# Patient Record
Sex: Female | Born: 1950 | Race: White | Hispanic: No | Marital: Married | Smoking: Never smoker
Health system: Southern US, Community
[De-identification: ages and names within clinical notes are randomized; demographics above are authoritative.]

## PROBLEM LIST (undated history)

## (undated) DIAGNOSIS — I8390 Asymptomatic varicose veins of unspecified lower extremity: Secondary | ICD-10-CM

## (undated) DIAGNOSIS — F1011 Alcohol abuse, in remission: Secondary | ICD-10-CM

## (undated) DIAGNOSIS — M419 Scoliosis, unspecified: Secondary | ICD-10-CM

## (undated) DIAGNOSIS — G4730 Sleep apnea, unspecified: Secondary | ICD-10-CM

## (undated) DIAGNOSIS — F1910 Other psychoactive substance abuse, uncomplicated: Secondary | ICD-10-CM

## (undated) DIAGNOSIS — H269 Unspecified cataract: Secondary | ICD-10-CM

## (undated) DIAGNOSIS — I471 Supraventricular tachycardia: Secondary | ICD-10-CM

## (undated) DIAGNOSIS — M1990 Unspecified osteoarthritis, unspecified site: Secondary | ICD-10-CM

## (undated) DIAGNOSIS — I10 Essential (primary) hypertension: Secondary | ICD-10-CM

## (undated) DIAGNOSIS — T7840XA Allergy, unspecified, initial encounter: Secondary | ICD-10-CM

## (undated) DIAGNOSIS — M8580 Other specified disorders of bone density and structure, unspecified site: Secondary | ICD-10-CM

## (undated) DIAGNOSIS — F32A Depression, unspecified: Secondary | ICD-10-CM

## (undated) DIAGNOSIS — C4490 Unspecified malignant neoplasm of skin, unspecified: Secondary | ICD-10-CM

## (undated) DIAGNOSIS — I499 Cardiac arrhythmia, unspecified: Secondary | ICD-10-CM

## (undated) DIAGNOSIS — K219 Gastro-esophageal reflux disease without esophagitis: Secondary | ICD-10-CM

## (undated) DIAGNOSIS — E785 Hyperlipidemia, unspecified: Secondary | ICD-10-CM

## (undated) DIAGNOSIS — Z973 Presence of spectacles and contact lenses: Secondary | ICD-10-CM

## (undated) DIAGNOSIS — F329 Major depressive disorder, single episode, unspecified: Secondary | ICD-10-CM

## (undated) DIAGNOSIS — E119 Type 2 diabetes mellitus without complications: Secondary | ICD-10-CM

## (undated) HISTORY — DX: Alcohol abuse, in remission: F10.11

## (undated) HISTORY — DX: Type 2 diabetes mellitus without complications: E11.9

## (undated) HISTORY — DX: Scoliosis, unspecified: M41.9

## (undated) HISTORY — DX: Sleep apnea, unspecified: G47.30

## (undated) HISTORY — DX: Depression, unspecified: F32.A

## (undated) HISTORY — DX: Unspecified cataract: H26.9

## (undated) HISTORY — PX: OTHER SURGICAL HISTORY: SHX169

## (undated) HISTORY — PX: ANTERIOR FUSION CERVICAL SPINE: SUR626

## (undated) HISTORY — DX: Allergy, unspecified, initial encounter: T78.40XA

## (undated) HISTORY — PX: BREAST BIOPSY: SHX20

## (undated) HISTORY — DX: Other specified disorders of bone density and structure, unspecified site: M85.80

## (undated) HISTORY — DX: Unspecified osteoarthritis, unspecified site: M19.90

## (undated) HISTORY — DX: Unspecified malignant neoplasm of skin, unspecified: C44.90

## (undated) HISTORY — DX: Hyperlipidemia, unspecified: E78.5

## (undated) HISTORY — PX: COLONOSCOPY W/ POLYPECTOMY: SHX1380

## (undated) HISTORY — DX: Asymptomatic varicose veins of unspecified lower extremity: I83.90

## (undated) HISTORY — DX: Major depressive disorder, single episode, unspecified: F32.9

## (undated) HISTORY — DX: Supraventricular tachycardia: I47.1

## (undated) HISTORY — DX: Other psychoactive substance abuse, uncomplicated: F19.10

---
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
## 2004-05-08 ENCOUNTER — Other Ambulatory Visit: Admission: RE | Admit: 2004-05-08 | Discharge: 2004-05-08 | Payer: Self-pay | Admitting: Obstetrics & Gynecology

## 2004-11-05 ENCOUNTER — Ambulatory Visit: Payer: Self-pay | Admitting: Internal Medicine

## 2005-01-28 ENCOUNTER — Ambulatory Visit: Payer: Self-pay | Admitting: Internal Medicine

## 2005-06-20 ENCOUNTER — Other Ambulatory Visit: Admission: RE | Admit: 2005-06-20 | Discharge: 2005-06-20 | Payer: Self-pay | Admitting: Obstetrics & Gynecology

## 2005-12-30 ENCOUNTER — Ambulatory Visit: Payer: Self-pay | Admitting: Internal Medicine

## 2006-08-27 ENCOUNTER — Ambulatory Visit: Payer: Self-pay | Admitting: Internal Medicine

## 2006-11-02 ENCOUNTER — Ambulatory Visit: Payer: Self-pay | Admitting: Internal Medicine

## 2007-02-01 ENCOUNTER — Ambulatory Visit: Payer: Self-pay | Admitting: Internal Medicine

## 2007-02-01 LAB — CONVERTED CEMR LAB
BUN: 7 mg/dL (ref 6–23)
CO2: 30 meq/L (ref 19–32)
Calcium: 9.5 mg/dL (ref 8.4–10.5)
Chloride: 107 meq/L (ref 96–112)
Cholesterol: 257 mg/dL (ref 0–200)
Direct LDL: 170.7 mg/dL
GFR calc non Af Amer: 92 mL/min
Potassium: 5 meq/L (ref 3.5–5.1)
VLDL: 35 mg/dL (ref 0–40)
Vit D, 1,25-Dihydroxy: 41 (ref 20–57)

## 2007-02-08 ENCOUNTER — Ambulatory Visit: Payer: Self-pay | Admitting: Internal Medicine

## 2007-06-15 DIAGNOSIS — M1712 Unilateral primary osteoarthritis, left knee: Secondary | ICD-10-CM | POA: Insufficient documentation

## 2007-06-15 DIAGNOSIS — E785 Hyperlipidemia, unspecified: Secondary | ICD-10-CM | POA: Insufficient documentation

## 2007-06-15 DIAGNOSIS — M949 Disorder of cartilage, unspecified: Secondary | ICD-10-CM

## 2007-06-15 DIAGNOSIS — F329 Major depressive disorder, single episode, unspecified: Secondary | ICD-10-CM | POA: Insufficient documentation

## 2007-06-15 DIAGNOSIS — R7309 Other abnormal glucose: Secondary | ICD-10-CM | POA: Insufficient documentation

## 2007-06-15 DIAGNOSIS — M899 Disorder of bone, unspecified: Secondary | ICD-10-CM | POA: Insufficient documentation

## 2007-06-15 DIAGNOSIS — F1011 Alcohol abuse, in remission: Secondary | ICD-10-CM | POA: Insufficient documentation

## 2007-06-15 DIAGNOSIS — J309 Allergic rhinitis, unspecified: Secondary | ICD-10-CM | POA: Insufficient documentation

## 2007-08-02 ENCOUNTER — Ambulatory Visit: Payer: Self-pay | Admitting: Internal Medicine

## 2007-08-06 ENCOUNTER — Telehealth: Payer: Self-pay | Admitting: Internal Medicine

## 2007-08-06 LAB — CONVERTED CEMR LAB
Total CHOL/HDL Ratio: 7
Triglycerides: 181 mg/dL — ABNORMAL HIGH (ref 0–149)

## 2007-08-09 ENCOUNTER — Ambulatory Visit: Payer: Self-pay | Admitting: Internal Medicine

## 2007-10-20 ENCOUNTER — Ambulatory Visit: Payer: Self-pay | Admitting: Internal Medicine

## 2007-10-20 LAB — CONVERTED CEMR LAB
AST: 24 units/L (ref 0–37)
Albumin: 3.9 g/dL (ref 3.5–5.2)
Alkaline Phosphatase: 67 units/L (ref 39–117)
Bilirubin, Direct: 0.1 mg/dL (ref 0.0–0.3)
Cholesterol: 242 mg/dL (ref 0–200)
TSH: 1.57 microintl units/mL (ref 0.35–5.50)
Total Bilirubin: 1.1 mg/dL (ref 0.3–1.2)
Total CHOL/HDL Ratio: 5.4
Total Protein: 6.8 g/dL (ref 6.0–8.3)

## 2007-10-27 ENCOUNTER — Ambulatory Visit: Payer: Self-pay | Admitting: Internal Medicine

## 2007-10-27 DIAGNOSIS — Z86010 Personal history of colonic polyps: Secondary | ICD-10-CM | POA: Insufficient documentation

## 2007-10-27 LAB — CONVERTED CEMR LAB: HDL goal, serum: 40 mg/dL

## 2007-12-21 ENCOUNTER — Ambulatory Visit: Payer: Self-pay | Admitting: Internal Medicine

## 2007-12-23 LAB — CONVERTED CEMR LAB
HDL: 41.9 mg/dL (ref >39.0)
Total CHOL/HDL Ratio: 4.2
Triglycerides: 143 mg/dL (ref 0–149)
VLDL: 29 mg/dL (ref 0–40)

## 2007-12-29 ENCOUNTER — Ambulatory Visit: Payer: Self-pay | Admitting: Internal Medicine

## 2007-12-29 DIAGNOSIS — M4120 Other idiopathic scoliosis, site unspecified: Secondary | ICD-10-CM | POA: Insufficient documentation

## 2007-12-29 DIAGNOSIS — K644 Residual hemorrhoidal skin tags: Secondary | ICD-10-CM | POA: Insufficient documentation

## 2007-12-29 DIAGNOSIS — M2170 Unequal limb length (acquired), unspecified site: Secondary | ICD-10-CM | POA: Insufficient documentation

## 2007-12-29 DIAGNOSIS — I10 Essential (primary) hypertension: Secondary | ICD-10-CM | POA: Insufficient documentation

## 2007-12-29 DIAGNOSIS — M25569 Pain in unspecified knee: Secondary | ICD-10-CM | POA: Insufficient documentation

## 2007-12-29 LAB — CONVERTED CEMR LAB: LDL Goal: 130 mg/dL

## 2008-01-03 ENCOUNTER — Encounter: Payer: Self-pay | Admitting: Internal Medicine

## 2008-01-06 ENCOUNTER — Ambulatory Visit: Payer: Self-pay | Admitting: Pulmonary Disease

## 2008-01-06 DIAGNOSIS — G4721 Circadian rhythm sleep disorder, delayed sleep phase type: Secondary | ICD-10-CM | POA: Insufficient documentation

## 2008-01-14 ENCOUNTER — Ambulatory Visit: Payer: Self-pay | Admitting: Internal Medicine

## 2008-03-07 ENCOUNTER — Encounter: Payer: Self-pay | Admitting: Pulmonary Disease

## 2008-03-21 ENCOUNTER — Ambulatory Visit: Payer: Self-pay | Admitting: Pulmonary Disease

## 2008-03-21 DIAGNOSIS — G4733 Obstructive sleep apnea (adult) (pediatric): Secondary | ICD-10-CM | POA: Insufficient documentation

## 2008-04-03 ENCOUNTER — Ambulatory Visit: Payer: Self-pay | Admitting: Internal Medicine

## 2008-07-19 ENCOUNTER — Ambulatory Visit: Payer: Self-pay | Admitting: Internal Medicine

## 2008-08-13 LAB — CONVERTED CEMR LAB: Pap Smear: NORMAL

## 2008-09-27 ENCOUNTER — Telehealth: Payer: Self-pay | Admitting: Internal Medicine

## 2008-09-27 ENCOUNTER — Ambulatory Visit: Payer: Self-pay | Admitting: Internal Medicine

## 2008-09-27 ENCOUNTER — Ambulatory Visit: Payer: Self-pay | Admitting: Cardiology

## 2008-09-28 ENCOUNTER — Telehealth (INDEPENDENT_AMBULATORY_CARE_PROVIDER_SITE_OTHER): Payer: Self-pay | Admitting: *Deleted

## 2008-09-28 ENCOUNTER — Encounter: Payer: Self-pay | Admitting: Internal Medicine

## 2008-11-13 HISTORY — PX: NASAL SINUS SURGERY: SHX719

## 2008-11-23 ENCOUNTER — Telehealth: Payer: Self-pay | Admitting: *Deleted

## 2008-11-27 ENCOUNTER — Encounter: Payer: Self-pay | Admitting: Internal Medicine

## 2008-12-11 ENCOUNTER — Encounter: Payer: Self-pay | Admitting: Internal Medicine

## 2008-12-19 ENCOUNTER — Telehealth: Payer: Self-pay | Admitting: Internal Medicine

## 2008-12-27 ENCOUNTER — Encounter (INDEPENDENT_AMBULATORY_CARE_PROVIDER_SITE_OTHER): Payer: Self-pay | Admitting: *Deleted

## 2009-01-15 ENCOUNTER — Encounter: Payer: Self-pay | Admitting: Internal Medicine

## 2009-01-22 ENCOUNTER — Ambulatory Visit: Payer: Self-pay | Admitting: Pulmonary Disease

## 2009-01-23 ENCOUNTER — Ambulatory Visit: Payer: Self-pay | Admitting: Internal Medicine

## 2009-02-07 ENCOUNTER — Ambulatory Visit: Payer: Self-pay | Admitting: Internal Medicine

## 2009-02-07 LAB — HM COLONOSCOPY

## 2009-02-13 ENCOUNTER — Ambulatory Visit: Payer: Self-pay | Admitting: Internal Medicine

## 2009-02-13 LAB — CONVERTED CEMR LAB
ALT: 24 units/L (ref 0–35)
Alkaline Phosphatase: 102 units/L (ref 39–117)
BUN: 9 mg/dL (ref 6–23)
Basophils Absolute: 0 10*3/uL (ref 0.0–0.1)
Basophils Relative: 0.3 % (ref 0.0–3.0)
Bilirubin Urine: NEGATIVE
Bilirubin, Direct: 0.1 mg/dL (ref 0.0–0.3)
CO2: 28 meq/L (ref 19–32)
Creatinine, Ser: 0.7 mg/dL (ref 0.4–1.2)
GFR calc non Af Amer: 91.37 mL/min (ref >60)
HCT: 38.1 % (ref 36.0–46.0)
Ketones, urine, test strip: NEGATIVE
Lymphs Abs: 3.1 10*3/uL (ref 0.7–4.0)
MCHC: 35.3 g/dL (ref 30.0–36.0)
Neutrophils Relative %: 37 % — ABNORMAL LOW (ref 43.0–77.0)
Nitrite: NEGATIVE
RBC: 4.11 M/uL (ref 3.87–5.11)
RDW: 12.3 % (ref 11.5–14.6)
Sodium: 143 meq/L (ref 135–145)
Specific Gravity, Urine: 1.015
Total Bilirubin: 0.8 mg/dL (ref 0.3–1.2)
Total Protein: 6.8 g/dL (ref 6.0–8.3)
Triglycerides: 111 mg/dL (ref 0.0–149.0)
Urobilinogen, UA: 0.2
VLDL: 22.2 mg/dL (ref 0.0–40.0)

## 2009-02-20 ENCOUNTER — Ambulatory Visit: Payer: Self-pay | Admitting: Internal Medicine

## 2009-02-20 DIAGNOSIS — K5909 Other constipation: Secondary | ICD-10-CM | POA: Insufficient documentation

## 2009-02-20 DIAGNOSIS — K219 Gastro-esophageal reflux disease without esophagitis: Secondary | ICD-10-CM | POA: Insufficient documentation

## 2009-02-20 DIAGNOSIS — Z85828 Personal history of other malignant neoplasm of skin: Secondary | ICD-10-CM | POA: Insufficient documentation

## 2009-02-26 ENCOUNTER — Encounter: Payer: Self-pay | Admitting: Internal Medicine

## 2009-03-07 ENCOUNTER — Telehealth: Payer: Self-pay | Admitting: *Deleted

## 2009-03-26 ENCOUNTER — Ambulatory Visit: Payer: Self-pay | Admitting: Pulmonary Disease

## 2009-04-26 ENCOUNTER — Encounter: Payer: Self-pay | Admitting: Pulmonary Disease

## 2009-05-04 ENCOUNTER — Telehealth: Payer: Self-pay | Admitting: Pulmonary Disease

## 2009-05-10 ENCOUNTER — Telehealth: Payer: Self-pay | Admitting: Pulmonary Disease

## 2009-07-02 ENCOUNTER — Encounter: Payer: Self-pay | Admitting: Internal Medicine

## 2009-07-13 ENCOUNTER — Encounter: Payer: Self-pay | Admitting: Internal Medicine

## 2009-07-19 ENCOUNTER — Ambulatory Visit: Payer: Self-pay | Admitting: Internal Medicine

## 2009-08-08 ENCOUNTER — Encounter: Payer: Self-pay | Admitting: Internal Medicine

## 2009-09-21 ENCOUNTER — Telehealth: Payer: Self-pay | Admitting: *Deleted

## 2010-02-20 ENCOUNTER — Ambulatory Visit: Payer: Self-pay | Admitting: Internal Medicine

## 2010-02-20 LAB — CONVERTED CEMR LAB
Alkaline Phosphatase: 79 units/L (ref 39–117)
BUN: 9 mg/dL (ref 6–23)
Basophils Absolute: 0.1 10*3/uL (ref 0.0–0.1)
Bilirubin Urine: NEGATIVE
Bilirubin, Direct: 0.1 mg/dL (ref 0.0–0.3)
CO2: 31 meq/L (ref 19–32)
Calcium: 9.6 mg/dL (ref 8.4–10.5)
GFR calc non Af Amer: 80.35 mL/min (ref >60)
Ketones, urine, test strip: NEGATIVE
MCHC: 34.8 g/dL (ref 30.0–36.0)
MCV: 92.8 fL (ref 78.0–100.0)
Monocytes Relative: 9.1 % (ref 3.0–12.0)
Nitrite: NEGATIVE
Platelets: 331 10*3/uL (ref 150.0–400.0)
Potassium: 5.2 meq/L — ABNORMAL HIGH (ref 3.5–5.1)
RBC: 4.2 M/uL (ref 3.87–5.11)
RDW: 12.7 % (ref 11.5–14.6)
TSH: 1.91 microintl units/mL (ref 0.35–5.50)
Total CHOL/HDL Ratio: 4
Total Protein: 6.9 g/dL (ref 6.0–8.3)
WBC: 6.9 10*3/uL (ref 4.5–10.5)
pH: 6.5

## 2010-02-27 ENCOUNTER — Ambulatory Visit: Payer: Self-pay | Admitting: Internal Medicine

## 2010-02-27 DIAGNOSIS — E875 Hyperkalemia: Secondary | ICD-10-CM | POA: Insufficient documentation

## 2010-02-27 DIAGNOSIS — R6882 Decreased libido: Secondary | ICD-10-CM | POA: Insufficient documentation

## 2010-02-27 DIAGNOSIS — J329 Chronic sinusitis, unspecified: Secondary | ICD-10-CM | POA: Insufficient documentation

## 2010-03-06 LAB — CONVERTED CEMR LAB
BUN: 9 mg/dL (ref 6–23)
Calcium: 9.4 mg/dL (ref 8.4–10.5)
Creatinine, Ser: 0.7 mg/dL (ref 0.4–1.2)
GFR calc non Af Amer: 88.12 mL/min (ref >60)
Hgb A1c MFr Bld: 6.4 % (ref 4.6–6.5)
Sodium: 141 meq/L (ref 135–145)

## 2010-03-20 ENCOUNTER — Encounter: Admission: RE | Admit: 2010-03-20 | Discharge: 2010-03-20 | Payer: Self-pay | Admitting: Obstetrics and Gynecology

## 2010-03-22 ENCOUNTER — Telehealth: Payer: Self-pay | Admitting: *Deleted

## 2010-06-10 ENCOUNTER — Encounter: Payer: Self-pay | Admitting: Internal Medicine

## 2010-07-30 ENCOUNTER — Encounter: Payer: Self-pay | Admitting: Internal Medicine

## 2010-08-02 ENCOUNTER — Ambulatory Visit: Payer: Self-pay | Admitting: Internal Medicine

## 2010-09-09 ENCOUNTER — Telehealth: Payer: Self-pay | Admitting: *Deleted

## 2010-11-13 NOTE — Progress Notes (Signed)
Summary: Pt req refill of Coreg to [facility]  Phone Note Refill Request Call back at Home Phone [phone] Message from:  Patient on September 09, 2010 11:26 AM  Refills Requested: Medication #1:  COREG 3.125 MG  TABS 1po two times a day  and may increase to 2 by mouth two times a day or as  directed   Dosage confirmed as above?Dosage Confirmed   Supply Requested: 3 months Pls call this in to [email or web address] [phone]     Method Requested: Telephone to [facility] Initial call taken by: Lucy Antigua,  September 09, 2010 11:26 AM  Follow-up for Phone Call        rx faxed to pharmacy Follow-up by: Romualdo Bolk, CMA Duncan Dull),  September 09, 2010 12:51 PM    Prescriptions: COREG 3.125 MG  TABS (CARVEDILOL) 1po two times a day  and may increase to 2 by mouth two times a day or as  directed  #180 x 0   Entered by:   Romualdo Bolk, CMA (AAMA)   Authorized by:   Madelin Headings MD   Signed by:   Romualdo Bolk, CMA (AAMA) on 09/09/2010   Method used:   Print then Give to Patient   RxID:   [id number]

## 2010-11-13 NOTE — Progress Notes (Signed)
Summary: REFILL REQUEST  Phone Note Refill Request Call back at [phone] Message from:  Patient on March 22, 2010 11:28 AM  Refills Requested: Medication #1:  COREG 3.125 MG  TABS 1po two times a day  and may increase to 2 by mouth two times a day or as  directed   Notes: [facility]... [phone]  Medication #2:  LIPITOR 20 MG  TABS 1 by mouth once daily   Notes: [facility]... [phone]    Initial call taken by: Debbra Riding,  March 22, 2010 11:30 AM  Follow-up for Phone Call        Rx faxed to pharmacy. Follow-up by: Romualdo Bolk, CMA Duncan Dull),  March 22, 2010 11:36 AM    Prescriptions: COREG 3.125 MG  TABS (CARVEDILOL) 1po two times a day  and may increase to 2 by mouth two times a day or as  directed  #180 x 1   Entered by:   Romualdo Bolk, CMA (AAMA)   Authorized by:   Madelin Headings MD   Signed by:   Romualdo Bolk, CMA (AAMA) on 03/22/2010   Method used:   Faxed to ...       [facility]       [address] [place], [place]  [address]       Ph: [phone]       Fax: [phone]   RxID:   [id number] LIPITOR 20 MG  TABS (ATORVASTATIN CALCIUM) 1 by mouth once daily  #90 x 1   Entered by:   Romualdo Bolk, CMA (AAMA)   Authorized by:   Madelin Headings MD   Signed by:   Romualdo Bolk, CMA (AAMA) on 03/22/2010   Method used:   Faxed to ...       [facility]       [address] [place], [place]  [address]       Ph: [phone]       Fax: [phone]   RxID:   [id number]

## 2010-11-13 NOTE — Assessment & Plan Note (Signed)
Summary: CPX//CCM   Vital Signs:  Patient profile:   61 year old female Menstrual status:  postmenopausal Height:      68.5 inches Weight:      192 pounds BMI:     28.87 Pulse rate:   72 / minute BP sitting:   130 / 70  (right arm)  Vitals Entered By: Kathrynn Speed CMA (Feb 27, 2010 10:17 AM)  Nutrition Counseling: Patient's BMI is greater than 25 and therefore counseled on weight management options. CC: CPX no pap- Pt has a gyn   History of Present Illness: Holly Beasley comes in today  . Just  beginning to exercise  with trainer   . GYNE  :  1 pump 2 x per  week for 6  weeks      to go back in 5  months .    MOod :   helps   on cymbalta   nuisance nce side  effects.  Allergic  xyrtec and as needed nasosnex and daily nasal rinse.  LIPIDs: on meds .  GERD :    off meds.  BP:   doing well.   Preventive Care Screening  Pap Smear:    Date:  11/13/2009    Results:  normal   Prior Values:    Pap Smear:  normal (08/13/2008)    Mammogram:  normal (10/13/2008)    Colonoscopy:  Location:  [facility].   (02/07/2009)    Bone Density:  abnormal (10/13/2004)    Last Tetanus Booster:  Tdap (04/03/2008)    Dexa Interp:  abnormal (10/13/2004)   Preventive Screening-Counseling & Management  Alcohol-Tobacco     Alcohol drinks/day: 0     Smoking Status: never     Passive Smoke Exposure: no  Caffeine-Diet-Exercise     Caffeine use/day: 4-bottles of tea     Does Patient Exercise: yes     Type of exercise: Trainer  Hep-HIV-STD-Contraception     Dental Visit-last 6 months yes     Sun Exposure-Excessive: no  Safety-Violence-Falls     Seat Belt Use: yes     Firearms in the Home: no firearms in the home     Smoke Detectors: yes      Blood Transfusions:  no.    Current Medications (verified): 1)  Calcium 500 Mg Tabs (Calcium) .... Take 2 Tablet By Mouth Once A Day 2)  Zyrtec Allergy 10 Mg  Tabs (Cetirizine Hcl) .... Take 1 Tablet By Mouth Once A Day 3)   Cymbalta 30 Mg Cpep (Duloxetine Hcl) .... Take 3 Tablets Once Daily By Mouth 4)  Multivitamins   Caps (Multiple Vitamin) .... Take 1 Tablet By Mouth Once A Day 5)  Vitamin D 1000 Unit  Tabs (Cholecalciferol) .... Every Day 6)  Lipitor 20 Mg  Tabs (Atorvastatin Calcium) .Marland Kitchen.. 1 By Mouth Once Daily 7)  Coreg 3.125 Mg  Tabs (Carvedilol) .Marland Kitchen.. 1po Two Times A Day  and May Increase To 2 By Mouth Two Times A Day or As  Directed 8)  Nasonex 50 Mcg/act Susp (Mometasone Furoate) 9)  Dulcolax 5 Mg Tbec (Bisacodyl) .... As Needed 10)  Restasis 0.05 % Emul (Cyclosporine) .Marland Kitchen.. 1 Drop Per Eye 2x A Day For Dry Eyes  Allergies (verified): 1)  Amoxicillin (Amoxicillin) 2)  Cephalexin (Cephalexin) 3)  Sulfamethoxazole (Sulfamethoxazole)  Past History:  Past medical, surgical, family and social histories (including risk factors) reviewed, and no changes noted (except as noted below).  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 of FM     Obstructive sleep apnea      - PSG 03/09/08 AHI 38 Skin cancer, hx of  basal cell nose  Scoliosis and leg length discrepancy   Past Surgical History: Reviewed history from 02/20/2009 and no changes required. Colon polypectomy, precancerous  2004 Tonsillectomy, adenoidectomy  1967 breast bx  2003 basal cell ca face  2004 Sinus Surgery 11/13/08- polyps removed  Past History:  Care Management: Gynecology: Lloyd Huger    Dr Sherren Kerns  Gastroenterology: Leone Payor ENT: Jenne Pane Pulmonary: Craige Cotta Psychiatry:  Raquel James   Family History: Reviewed history from 02/20/2009 and no changes required. Family History High cholesterol Family History of Alcoholism/Addiction bro x 2  Family History Diabetes 1st degree relative Family History of Arthritis bro RA Mom NHLymphoma  LIPIDs         Social  History: Reviewed history from 02/20/2009 and no changes required. 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         constipation  with medication , decrease libido under rx . ? CTS   pain right wrist    tender from  holding  horn.  dry eyes  on restasis. Physical Exam General Appearance: well developed, well nourished, no acute distress Eyes: conjunctiva and lids normal, PERRLA, EOMI, WNL Ears, Nose, Mouth, Throat: TM clear, nares clear, oral exam WNL Neck: supple, no lymphadenopathy, no thyromegaly, no JVD Respiratory: clear to auscultation and percussion, respiratory effort normal Cardiovascular: regular rate and rhythm, S1-S2, no murmur, rub or gallop, no bruits, peripheral pulses normal and symmetric, no cyanosis, clubbing, edema or varicosities Chest: no scars, masses, tenderness; no asymmetry, skin changes, nipple discharge   Gastrointestinal: soft, non-tender; no hepatosplenomegaly, masses; active bowel sounds all quadrants,  Genitourinary: per gyne Lymphatic: no cervical, axillary or inguinal adenopathy Musculoskeletal: gait normal, muscle tone and strength WNL, no joint swelling, effusions, discoloration, crepitus  Skin: clear, good turgor, color WNL, no rashes, lesions, or ulcerations Neurologic: normal mental status, normal reflexes, normal strength, sensation, and motion Psychiatric: alert; oriented to person, place and time Other Exam:     Impression & Recommendations:  Problem # 1:   HEALTH MAINTENANCE EXAM, ADULT (ICD-V70.0) Discussed nutrition,exercise,diet,healthy weight, vitamin D and calcium.   Problem # 2:  HYPERTENSION (ICD-401.9) controlled  Her updated medication list for this problem includes:    Coreg 3.125 Mg Tabs (Carvedilol) .Marland Kitchen... 1po two times a day  and may increase to 2 by mouth two times a day or as  directed  Problem # 3:  GERD (ICD-530.81) resolved ok to do lifestyle intervention  The following medications were removed from the medication list:    Prilosec 20 Mg Cpdr (Omeprazole) .Marland Kitchen... Take 1 tablet by mouth once a day  Problem # 4:  HYPERLIPIDEMIA (ICD-272.4) controlled    original readings were in the 300s   no se of meds seen  Her updated medication list for this problem includes:    Lipitor  20 Mg Tabs (Atorvastatin calcium) .Marland Kitchen... 1 by mouth once daily  Problem # 5:  OSTEOARTHRITIS (ICD-715.90)  Problem # 6:  OSTEOPENIA (ICD-733.90)  Her updated medication list for this problem includes:    Calcium 500 Mg Tabs (Calcium) .Marland Kitchen... Take 2 tablet by mouth once a day    Vitamin D 1000 Unit Tabs (Cholecalciferol) ..... 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  Her updated medication list for this problem includes:    Nasonex 50 Mcg/act Susp (Mometasone furoate)  Problem # 10:  HYPERKALEMIA (ICD-276.7) Assessment: New on routine labs   Orders: TLB-BMP (Basic Metabolic Panel-BMET) (80048-METABOL)  Complete Medication List: 1)  Calcium 500 Mg Tabs (Calcium) .... Take 2 tablet by mouth once a day 2)  Zyrtec Allergy 10 Mg Tabs (Cetirizine hcl) .... Take 1 tablet by mouth once a day 3)  Cymbalta 30 Mg Cpep  (Duloxetine hcl) .... Take 3 tablets once daily by mouth 4)  Multivitamins Caps (Multiple vitamin) .... Take 1 tablet by mouth once a day 5)  Vitamin D 1000 Unit Tabs (Cholecalciferol) .... Every day 6)  Lipitor 20 Mg Tabs (Atorvastatin calcium) .Marland Kitchen.. 1 by mouth once daily 7)  Coreg 3.125 Mg Tabs (Carvedilol) .Marland Kitchen.. 1po two times a day  and may increase to 2 by mouth two times a day or as  directed 8)  Nasonex 50 Mcg/act Susp (Mometasone furoate) 9)  Dulcolax 5 Mg Tbec (Bisacodyl) .... As needed 10)  Restasis 0.05 % Emul (Cyclosporine) .Marland Kitchen.. 1 drop per eye 2x a day for dry eyes  Patient Instructions: 1)  limit sweets  and sugars  processed . 2)  Small amounts of dark chocolate  ok but not pre made  processed preparations. 3)  Increase exercise and weight loss will help your blood sugar.  4)  Continue other meds . 5)  You will be informed of lab results when available. and will plan follow up depending on results .

## 2010-11-13 NOTE — Medication Information (Signed)
Summary: Medication Non-Adherence  Medication Non-Adherence   Imported By: Maryln Gottron 06/25/2010 11:30:41  _____________________________________________________________________  External Attachment:    Type:   Image     Comment:   External Document

## 2010-11-13 NOTE — Assessment & Plan Note (Signed)
Summary: FLU SHOT//ALP  Nurse Visit   Allergies: 1)  Amoxicillin (Amoxicillin) 2)  Cephalexin (Cephalexin) 3)  Sulfamethoxazole (Sulfamethoxazole)  Orders Added: 1)  Admin 1st Vaccine [90471] 2)  Flu Vaccine 88yrs + [29562]  Flu Vaccine Consent Questions     Do you have a history of severe allergic reactions to this vaccine? no    Any prior history of allergic reactions to egg and/or gelatin? no    Do you have a sensitivity to the preservative Thimersol? no    Do you have a past history of Guillan-Barre Syndrome? no    Do you currently have an acute febrile illness? no    Have you ever had a severe reaction to latex? no    Vaccine information given and explained to patient? yes    Are you currently pregnant? no    Lot Number:[id number]   Exp Date:04/12/2011   Site Given  Left Deltoid IM Romualdo Bolk, CMA Duncan Dull)  August 02, 2010 3:04 PM

## 2010-11-13 NOTE — Medication Information (Signed)
Summary: Possible Medication Non Adherence  Possible Medication Non Adherence   Imported By: Maryln Gottron 08/06/2010 12:31:21  _____________________________________________________________________  External Attachment:    Type:   Image     Comment:   External Document

## 2010-12-02 ENCOUNTER — Other Ambulatory Visit: Payer: Self-pay | Admitting: *Deleted

## 2010-12-02 MED ORDER — ATORVASTATIN CALCIUM 20 MG PO TABS: 20.0000 mg | ORAL_TABLET | Freq: Every day | ORAL | Status: DC

## 2010-12-03 ENCOUNTER — Telehealth: Payer: Self-pay | Admitting: Internal Medicine

## 2010-12-03 MED ORDER — CARVEDILOL 3.125 MG PO TABS: 3.1250 mg | ORAL_TABLET | Freq: Two times a day (BID) | ORAL | Status: DC

## 2010-12-03 NOTE — Telephone Encounter (Signed)
Both rx's have already been sent to [facility] earlier. Pt does need a rov

## 2010-12-03 NOTE — Telephone Encounter (Signed)
Pt would like to have a written Rx prepared for meds:  Lipitor / Carvedilol..... So she can send them into [facility] herself..... # [id number].

## 2010-12-04 NOTE — Telephone Encounter (Signed)
Pt aware will call back to schedule appt

## 2010-12-30 ENCOUNTER — Ambulatory Visit (HOSPITAL_COMMUNITY)
Admission: RE | Admit: 2010-12-30 | Discharge: 2010-12-30 | Disposition: A | Discharge status: Home / Self Care | Payer: PRIVATE HEALTH INSURANCE | Source: Ambulatory Visit | Attending: Internal Medicine | Admitting: Internal Medicine

## 2010-12-30 ENCOUNTER — Encounter: Payer: Self-pay | Admitting: Internal Medicine

## 2010-12-30 ENCOUNTER — Ambulatory Visit (INDEPENDENT_AMBULATORY_CARE_PROVIDER_SITE_OTHER): Payer: PRIVATE HEALTH INSURANCE | Admitting: Internal Medicine

## 2010-12-30 ENCOUNTER — Other Ambulatory Visit: Payer: Self-pay | Admitting: Internal Medicine

## 2010-12-30 VITALS — BP 158/100 | HR 88 | Wt 201.0 lb

## 2010-12-30 DIAGNOSIS — R609 Edema, unspecified: Secondary | ICD-10-CM

## 2010-12-30 DIAGNOSIS — F1011 Alcohol abuse, in remission: Secondary | ICD-10-CM | POA: Insufficient documentation

## 2010-12-30 DIAGNOSIS — IMO0001 Reserved for inherently not codable concepts without codable children: Secondary | ICD-10-CM

## 2010-12-30 DIAGNOSIS — R51 Headache: Secondary | ICD-10-CM | POA: Insufficient documentation

## 2010-12-30 DIAGNOSIS — R52 Pain, unspecified: Secondary | ICD-10-CM

## 2010-12-30 DIAGNOSIS — R030 Elevated blood-pressure reading, without diagnosis of hypertension: Secondary | ICD-10-CM

## 2010-12-30 DIAGNOSIS — R519 Headache, unspecified: Secondary | ICD-10-CM | POA: Insufficient documentation

## 2010-12-30 DIAGNOSIS — M419 Scoliosis, unspecified: Secondary | ICD-10-CM | POA: Insufficient documentation

## 2010-12-30 DIAGNOSIS — M7989 Other specified soft tissue disorders: Secondary | ICD-10-CM

## 2010-12-30 DIAGNOSIS — M8580 Other specified disorders of bone density and structure, unspecified site: Secondary | ICD-10-CM | POA: Insufficient documentation

## 2010-12-30 MED ORDER — CARVEDILOL 3.125 MG PO TABS: 3.1250 mg | ORAL_TABLET | Freq: Two times a day (BID) | ORAL | Status: DC

## 2010-12-30 NOTE — Patient Instructions (Signed)
Increase your carvedilol to 2 in am and 1 in pm and after 1-2 weeks increase to 2 2 x per day. Avoid excessive valsalva maneuvers    In the meantime. return office visit in 1 month with BP readings  Will get doppler of right leg  .

## 2010-12-30 NOTE — Progress Notes (Signed)
  Subjective:    Patient ID: Holly Beasley, female    DOB: 02-Oct-1951, 60 y.o.   MRN: [id number]  HPI  patient comes in today for an acute visit for problem with swelling in her right ankle foot area. But she has a couple of other issues.   She was traveling to [place] and back on 3/11 and because her father had severe congestive heart failure. After the flight she noted swelling in her lower extremity around her foot and ankle. There was no associated pain or disability the swelling goes down at night she's had this problem since that time he has been able to exercise and no limitation of motion. She denies a specific injury or twist. Problem is persisting; no treatment intervention.   she is taking carvedilol 3 point 125 twice a day for her blood pressure and performance anxiety. She hasn't really checked her blood pressure readings recently she has been doing regular exercise and now has a trainer. She has had 2 episodes where she had severe pain nauseated and headache on the top of her head with severe Christella Hartigan ladder exercise. Since she has backed off and has not recurred. She is able to play her horn without symptoms.   He may have had headaches in the past but this may be different. There was no syncope change in vision neck pain stiffness an onset was quick but not thunderclap.  past medical fm hx  history reviewed  Review of Systems  no chest pain shortness of graft change in her vision hearing injuries cognition change. There has been some stress because of her father's illness with CHF.    Objective:   Physical Exam  well-developed well-nourished in no acute distress she had just been exercising.  Blood pressure repeated left arm 154/80 sitting right 144/84 sitting  HEENT is grossly normal eyes PERRLA neck supple without masses thyromegaly or bruit.  Chest CTA bs =  Cardiac S1-S2 no gallops or murmurs  Extremities show +1 edema a round the distal right extremity and near the ankle  into the midfoot. Is non tender appears to be more laterally than medially there is no Homans sign redness or cords. Her gait is generally within normal limits.   No color changes or temperature changes pulses are normal   No bony tenderness of the ankle.     Assessment & Plan:   right lower extremity swelling  He seems to be more around the ankle but is right above it also and she relates it to travel in an airplane. It probably isn't obstructive but will check a venous Doppler and if is negative consider more of an orthopedic evaluation.     Hypertension worse    Her readings were up today in the office she hasn't been checking it recently she is only on low-dose carvedilol would recommend we increase her medication slowly and then have her followup a call in the meantime if her readings are too high.   Headache x2 seems to be exertional   nonfocal exam today no problems today , non focal exam would avoid  Severe isotonic exercises for now until blood pressure is controlled. She will call if this problem recurs Will follow her up in one to 2 months.

## 2011-01-01 NOTE — Progress Notes (Signed)
Pt aware of results and it went away. She is going to watch it for now to see if it comes back.

## 2011-02-19 ENCOUNTER — Other Ambulatory Visit: Payer: Self-pay | Admitting: Internal Medicine

## 2011-02-19 MED ORDER — CARVEDILOL 3.125 MG PO TABS: 3.1250 mg | ORAL_TABLET | Freq: Two times a day (BID) | ORAL | Status: DC

## 2011-02-19 NOTE — Telephone Encounter (Signed)
rx sent to pharmacy

## 2011-02-19 NOTE — Telephone Encounter (Signed)
Refill Carvedilol to [facility].

## 2011-03-13 ENCOUNTER — Other Ambulatory Visit (INDEPENDENT_AMBULATORY_CARE_PROVIDER_SITE_OTHER): Payer: PRIVATE HEALTH INSURANCE

## 2011-03-13 DIAGNOSIS — Z0000 Encounter for general adult medical examination without abnormal findings: Secondary | ICD-10-CM

## 2011-03-13 LAB — LIPID PANEL
HDL: 49.6 mg/dL (ref >39.00)
LDL Cholesterol: 107 mg/dL — ABNORMAL HIGH (ref 0–99)
Triglycerides: 165 mg/dL — ABNORMAL HIGH (ref 0.0–149.0)
VLDL: 33 mg/dL (ref 0.0–40.0)

## 2011-03-13 LAB — CBC WITH DIFFERENTIAL/PLATELET
Basophils Absolute: 0 10*3/uL (ref 0.0–0.1)
Basophils Relative: 0.7 % (ref 0.0–3.0)
Eosinophils Absolute: 0.4 10*3/uL (ref 0.0–0.7)
Hemoglobin: 13.8 g/dL (ref 12.0–15.0)
Lymphocytes Relative: 43.8 % (ref 12.0–46.0)
MCV: 95.1 fl (ref 78.0–100.0)
Monocytes Absolute: 0.7 10*3/uL (ref 0.1–1.0)
Monocytes Relative: 9.9 % (ref 3.0–12.0)
Platelets: 261 10*3/uL (ref 150.0–400.0)
RBC: 4.23 Mil/uL (ref 3.87–5.11)
WBC: 7.5 10*3/uL (ref 4.5–10.5)

## 2011-03-13 LAB — HEPATIC FUNCTION PANEL
Albumin: 4 g/dL (ref 3.5–5.2)
Alkaline Phosphatase: 84 U/L (ref 39–117)
Total Bilirubin: 1.1 mg/dL (ref 0.3–1.2)

## 2011-03-13 LAB — BASIC METABOLIC PANEL
CO2: 28 mEq/L (ref 19–32)
Creatinine, Ser: 0.9 mg/dL (ref 0.4–1.2)
GFR: 68.76 mL/min (ref >60.00)
Potassium: 4.6 mEq/L (ref 3.5–5.1)
Sodium: 140 mEq/L (ref 135–145)

## 2011-03-13 LAB — POCT URINALYSIS DIPSTICK: Urobilinogen, UA: 0.2

## 2011-03-19 ENCOUNTER — Encounter: Payer: Self-pay | Admitting: Internal Medicine

## 2011-03-19 ENCOUNTER — Ambulatory Visit (INDEPENDENT_AMBULATORY_CARE_PROVIDER_SITE_OTHER): Payer: PRIVATE HEALTH INSURANCE | Admitting: Internal Medicine

## 2011-03-19 VITALS — BP 130/80 | HR 78 | Ht 68.25 in | Wt 200.0 lb

## 2011-03-19 DIAGNOSIS — I10 Essential (primary) hypertension: Secondary | ICD-10-CM

## 2011-03-19 DIAGNOSIS — Z0000 Encounter for general adult medical examination without abnormal findings: Secondary | ICD-10-CM

## 2011-03-19 DIAGNOSIS — F329 Major depressive disorder, single episode, unspecified: Secondary | ICD-10-CM

## 2011-03-19 DIAGNOSIS — Z136 Encounter for screening for cardiovascular disorders: Secondary | ICD-10-CM

## 2011-03-19 DIAGNOSIS — G4733 Obstructive sleep apnea (adult) (pediatric): Secondary | ICD-10-CM

## 2011-03-19 DIAGNOSIS — E785 Hyperlipidemia, unspecified: Secondary | ICD-10-CM

## 2011-03-19 DIAGNOSIS — R7303 Prediabetes: Secondary | ICD-10-CM

## 2011-03-19 DIAGNOSIS — R7309 Other abnormal glucose: Secondary | ICD-10-CM

## 2011-03-19 MED ORDER — CARVEDILOL 3.125 MG PO TABS: ORAL_TABLET | ORAL | Status: DC

## 2011-03-19 MED ORDER — ATORVASTATIN CALCIUM 20 MG PO TABS: 20.0000 mg | ORAL_TABLET | Freq: Every day | ORAL | Status: DC

## 2011-03-19 MED ORDER — METFORMIN HCL ER 500 MG PO TB24: 500.0000 mg | ORAL_TABLET | Freq: Every day | ORAL | Status: DC

## 2011-03-19 NOTE — Progress Notes (Signed)
Subjective:    Patient ID: Holly Beasley, female    DOB: 1951/01/29, 60 y.o.   MRN: [id number]  HPI Patient comes in today for above . Since last viist has been doing ok. But has gained some weight.  Eating bee pollen  To try to lose weight. Hard to lose weight.  Has some concerns about cymbalta adding to issues. Psych: sees drd Raquel James Sinus : Off and on but better since surgery  LIpids: no se of meds  OSA  Using dental appliance  HT and performance  issues are controlled Review of Systems 12 system review neg except as above and dry eyes  Allergy no falls or bleeding.  Past Medical History  Diagnosis Date  . Osteopenia     dexa -1.7 h -1.35   . Sleep apnea   . Scoliosis     leg length discrepancy  . Allergy   . Depression   . Osteoarthritis   . Skin cancer     basal cell nose  . History of ETOH abuse     recovering  in remission  . Hyperlipidemia   . Dry eyes    Past Surgical History  Procedure Date  . Colonoscopy w/ polypectomy     precancerous   2004  . Tonsillectomy and adenoidectomy 1967  . Nasal sinus surgery 11-13-2008    polyps removed  . Breast biopsy     2003  . Skin cancer removal     basal cell face    reports that she has never smoked. She does not have any smokeless tobacco history on file. She reports that she does not drink alcohol or use illicit drugs. family history includes Alcohol abuse in her brothers; Arthritis in her brother; Diabetes in her mother; Heart failure in her father; Hyperlipidemia in her father and mother; and Lymphoma in her mother. Allergies  Allergen Reactions  . Amoxicillin     REACTION: unspecified  . Cephalexin     REACTION: unspecified  . Sulfamethoxazole     REACTION: unspecified       Objective:   Physical Exam Physical Exam: Vital signs reviewed WUJ:WJXB is a well-developed well-nourished alert cooperative  white female who appears her stated age in no acute distress.  HEENT: normocephalic  traumatic ,  Eyes: PERRL EOM's full, conjunctiva clear, Nares: paten,t no deformity discharge or tenderness., Ears: no deformity EAC's clear TMs with normal landmarks. Mouth: clear OP, no lesions, edema.  Moist mucous membranes. Dentition in adequate repair. NECK: supple without masses, thyromegaly or bruits. CHEST/PULM:  Clear to auscultation and percussion breath sounds equal no wheeze , rales or rhonchi. No chest wall deformities or tenderness. CV: PMI is nondisplaced, S1 S2 no gallops, murmurs, rubs. Peripheral pulses are full without delay.No JVD .  Breast: normal by inspection . No dimpling, discharge, masses, tenderness or discharge . ABDOMEN: Bowel sounds normal nontender  No guard or rebound, no hepato splenomegal no CVA tenderness.  No hernia. Extremtities:  No clubbing cyanosis or edema, no acute joint swelling or redness no focal atrophy NEURO:  Oriented x3, cranial nerves 3-12 appear to be intact, no obvious focal weakness,gait within normal limits no abnormal reflexes or asymmetrical SKIN: No acute rashes normal turgor, color, no bruising or petechiae. LN: no cervical axillary inguinal adenopathy PSYCH: Oriented, good eye contact, no obvious depression anxiety, cognition and judgment appear normal. Labs reviewed  Ok except elevated FBS     EKG normal   Assessment & Plan:  Preventive Health Care  Counseled regarding healthy nutrition, exercise, sleep, injury prevention, calcium vit d and healthy weight . Lipids  Doing well  Disc  se of meds   orig in 300 tc  Hyperglycemia Pre diabetes vs early diabetes. positive family hx and should remain on statin Counseled.   Will begin on meds  And intensify lsi    Declined nutrition referral.  Feels good with her knowledge base. Moods  Under psych care.  OSA  rx with appliance and doing well.

## 2011-03-19 NOTE — Patient Instructions (Addendum)
lifestyle intervention healthy eating and exercise .  Begin metformin once a day  Recheck in 2-3 months and lab before visit .  Get eye check.

## 2011-03-20 ENCOUNTER — Encounter: Payer: Self-pay | Admitting: Internal Medicine

## 2011-03-20 DIAGNOSIS — R7303 Prediabetes: Secondary | ICD-10-CM | POA: Insufficient documentation

## 2011-03-20 DIAGNOSIS — H04123 Dry eye syndrome of bilateral lacrimal glands: Secondary | ICD-10-CM | POA: Insufficient documentation

## 2011-03-20 DIAGNOSIS — Z0000 Encounter for general adult medical examination without abnormal findings: Secondary | ICD-10-CM | POA: Insufficient documentation

## 2011-06-20 ENCOUNTER — Other Ambulatory Visit (INDEPENDENT_AMBULATORY_CARE_PROVIDER_SITE_OTHER): Payer: PRIVATE HEALTH INSURANCE

## 2011-06-20 DIAGNOSIS — E119 Type 2 diabetes mellitus without complications: Secondary | ICD-10-CM

## 2011-06-20 LAB — BASIC METABOLIC PANEL
GFR: 73.44 mL/min (ref >60.00)
Potassium: 4 mEq/L (ref 3.5–5.1)
Sodium: 141 mEq/L (ref 135–145)

## 2011-06-20 LAB — HEMOGLOBIN A1C: Hgb A1c MFr Bld: 6.5 % (ref 4.6–6.5)

## 2011-06-27 ENCOUNTER — Ambulatory Visit (INDEPENDENT_AMBULATORY_CARE_PROVIDER_SITE_OTHER): Payer: PRIVATE HEALTH INSURANCE | Admitting: Internal Medicine

## 2011-06-27 ENCOUNTER — Encounter: Payer: Self-pay | Admitting: Internal Medicine

## 2011-06-27 ENCOUNTER — Ambulatory Visit: Payer: PRIVATE HEALTH INSURANCE | Admitting: Internal Medicine

## 2011-06-27 VITALS — BP 124/76 | HR 88 | Temp 98.3°F | Wt 196.0 lb

## 2011-06-27 DIAGNOSIS — Z23 Encounter for immunization: Secondary | ICD-10-CM

## 2011-06-27 DIAGNOSIS — I10 Essential (primary) hypertension: Secondary | ICD-10-CM

## 2011-06-27 DIAGNOSIS — L259 Unspecified contact dermatitis, unspecified cause: Secondary | ICD-10-CM

## 2011-06-27 DIAGNOSIS — L309 Dermatitis, unspecified: Secondary | ICD-10-CM | POA: Insufficient documentation

## 2011-06-27 DIAGNOSIS — R7303 Prediabetes: Secondary | ICD-10-CM

## 2011-06-27 DIAGNOSIS — R7309 Other abnormal glucose: Secondary | ICD-10-CM

## 2011-06-27 MED ORDER — METFORMIN HCL ER 500 MG PO TB24: 1000.0000 mg | ORAL_TABLET | Freq: Every day | ORAL | Status: DC

## 2011-06-27 NOTE — Patient Instructions (Addendum)
Intensify lifestyle interventions. Avoid sugars and sweets.  Exercise continue.   Increase  the metformin.  To 1000mg  per day ( 2   At a time)

## 2011-06-27 NOTE — Progress Notes (Signed)
  Subjective:    Patient ID: Holly Beasley, female    DOB: 07-Sep-1951, 60 y.o.   MRN: [id number]  HPI Patient comes in today for follow up of  multiple medical problems.    Prediabetes  Taking metformin 500 per day  No se of meds and has lost some weight with dietary changes  Ht no change  Hand eczema still problematic    Review of Systems Neg cp sob fever infections or numbness  Past history family history social history reviewed in the electronic medical record.     Objective:   Physical Exam  Wt Readings from Last 3 Encounters:  06/27/11 196 lb (88.905 kg)  03/19/11 200 lb (90.719 kg)  12/30/10 201 lb (91.173 kg)   WDWN in nad Lab Results  Component Value Date   HGBA1C 6.5 06/20/2011   Hands  Discrete flaky eczema patches  Lab Results  Component Value Date   WBC 7.5 03/13/2011   HGB 13.8 03/13/2011   HCT 40.2 03/13/2011   PLT 261.0 03/13/2011   CHOL 190 03/13/2011   TRIG 165.0* 03/13/2011   HDL 49.60 03/13/2011   LDLDIRECT 162.1 10/20/2007   ALT 29 03/13/2011   AST 24 03/13/2011   NA 141 06/20/2011   K 4.0 06/20/2011   CL 106 06/20/2011   CREATININE 0.8 06/20/2011   BUN 12 06/20/2011   CO2 26 06/20/2011   TSH 1.83 03/13/2011   HGBA1C 6.5 06/20/2011        Assessment & Plan:  Pre diabetes  a1Flu c about the same as last year   No se of meds so far .Marland KitchenIntensify lifestyle interventions. And then increase metformin and fu labs and ov.   Eczema  Continue rx per derm Flu shot today

## 2011-10-23 ENCOUNTER — Other Ambulatory Visit: Payer: Self-pay | Admitting: Obstetrics & Gynecology

## 2011-10-23 DIAGNOSIS — Z1231 Encounter for screening mammogram for malignant neoplasm of breast: Secondary | ICD-10-CM

## 2011-10-30 ENCOUNTER — Other Ambulatory Visit (INDEPENDENT_AMBULATORY_CARE_PROVIDER_SITE_OTHER): Payer: PRIVATE HEALTH INSURANCE

## 2011-10-30 DIAGNOSIS — R7303 Prediabetes: Secondary | ICD-10-CM

## 2011-10-30 DIAGNOSIS — R7309 Other abnormal glucose: Secondary | ICD-10-CM

## 2011-10-30 LAB — HEMOGLOBIN A1C: Hgb A1c MFr Bld: 6.8 % — ABNORMAL HIGH (ref 4.6–6.5)

## 2011-11-06 ENCOUNTER — Ambulatory Visit (INDEPENDENT_AMBULATORY_CARE_PROVIDER_SITE_OTHER): Payer: PRIVATE HEALTH INSURANCE | Admitting: Internal Medicine

## 2011-11-06 ENCOUNTER — Ambulatory Visit
Admission: RE | Admit: 2011-11-06 | Discharge: 2011-11-06 | Disposition: A | Discharge status: Home / Self Care | Payer: PRIVATE HEALTH INSURANCE | Source: Ambulatory Visit | Attending: Obstetrics & Gynecology | Admitting: Obstetrics & Gynecology

## 2011-11-06 ENCOUNTER — Encounter: Payer: Self-pay | Admitting: Internal Medicine

## 2011-11-06 VITALS — BP 122/84 | HR 88 | Temp 97.9°F | Wt 198.0 lb

## 2011-11-06 DIAGNOSIS — I10 Essential (primary) hypertension: Secondary | ICD-10-CM

## 2011-11-06 DIAGNOSIS — R0602 Shortness of breath: Secondary | ICD-10-CM | POA: Insufficient documentation

## 2011-11-06 DIAGNOSIS — J329 Chronic sinusitis, unspecified: Secondary | ICD-10-CM

## 2011-11-06 DIAGNOSIS — J309 Allergic rhinitis, unspecified: Secondary | ICD-10-CM

## 2011-11-06 DIAGNOSIS — Z1231 Encounter for screening mammogram for malignant neoplasm of breast: Secondary | ICD-10-CM

## 2011-11-06 DIAGNOSIS — E785 Hyperlipidemia, unspecified: Secondary | ICD-10-CM

## 2011-11-06 DIAGNOSIS — R7309 Other abnormal glucose: Secondary | ICD-10-CM

## 2011-11-06 DIAGNOSIS — R7303 Prediabetes: Secondary | ICD-10-CM

## 2011-11-06 DIAGNOSIS — G4733 Obstructive sleep apnea (adult) (pediatric): Secondary | ICD-10-CM

## 2011-11-06 DIAGNOSIS — R110 Nausea: Secondary | ICD-10-CM

## 2011-11-06 NOTE — Assessment & Plan Note (Signed)
Disc lsi and meds  And close follow up.

## 2011-11-06 NOTE — Progress Notes (Signed)
  Subjective:    Patient ID: Holly Beasley, female    DOB: 26-Sep-1951, 61 y.o.   MRN: [id number]  HPI Patient comes in today for follow up of  multiple medical problems.  Since last visit :  On avelox for sinus infection.   At End of rx   Per Dr Jenne Pane .  Nausea  ? Vomiting with working out.  And  sometimes winded with talking.  Then stop for a few minutes.   No real CP  Or wheezing or pnd or nocturnal sx  Has  OSA and uses mouth apparatus  Pre diabetes only taking one.  Metformin  Had nausea with double dose .  Does still snack with some sugar  Foods at night  Exercising now.  Lipids no se of meds  Review of Systems No cp fever weight loss  New GI GU issue   sDOB when talking sometimes but not when playing instrument  No bleeding  .  Other change except HPI  Past history family history social history reviewed in the electronic medical record. Mom diabetes     Objective:   Physical Exam WDWN in and HEENT grossly normal Neck: Supple without adenopathy or masses or bruits Chest:  Clear to A&P without wheezes rales or rhonchi CV:  S1-S2 no gallops or murmurs peripheral perfusion is normal No clubbing cyanosis   trc edema  Lab Results  Component Value Date   HGBA1C 6.8* 10/30/2011        Assessment & Plan:  Prediabetes a1c now in diabetic abeit controlled range  Some se of metformin inc dose  Disc this   And try to split dose  Intensify lifestyle interventions.  Exercise induced nausea and ? Vomiting without cp    Sob with talking  Not with horn  Or  With her exercise ? Cause    Has risk factors for heart disease and with exercise induced sx will get cards consult  ? if myoview  Or stress echo indicated   HT stable  LIPIDS  Recurrent sinusitis chronic

## 2011-11-06 NOTE — Patient Instructions (Signed)
Intensify lifestyle interventions. But will get consult about the exercise induced nausea that you are getting.   Avoid significant sugars and sweets.  You blood sugars are creeping up.  Can try the metformin  500 mg twice a day  To see if has decrease nausea.  hga1c in 3 months

## 2011-11-21 ENCOUNTER — Encounter: Payer: Self-pay | Admitting: Cardiovascular Disease

## 2011-11-21 ENCOUNTER — Ambulatory Visit (INDEPENDENT_AMBULATORY_CARE_PROVIDER_SITE_OTHER): Payer: PRIVATE HEALTH INSURANCE | Admitting: Cardiovascular Disease

## 2011-11-21 VITALS — BP 150/80 | HR 80 | Ht 68.0 in | Wt 201.8 lb

## 2011-11-21 DIAGNOSIS — R0602 Shortness of breath: Secondary | ICD-10-CM

## 2011-11-21 MED ORDER — CARVEDILOL 12.5 MG PO TABS: ORAL_TABLET | ORAL | Status: DC

## 2011-11-21 NOTE — Progress Notes (Signed)
Holly Beasley Date of Birth  09-Jan-1951 [facility]     [place] Office  [address]    [address]   [address] [place], [place]  [address]    [place], [place]  [address] [phone]  Fax  [phone]  [phone]  Fax [phone]  1. Hyperlipidemia 2. Nausea - while working out  History of Present Illness:  Holly Beasley is a 61 yo with the above noted hx. she's noticed some recent episodes of nausea when she is working out.  She is noticing presyncope in the middle of her workout. This is all associated with some profuse sweating and some pallor. This was noted by her personal trainer. She works out 3 times a week.  She's also noticed some shortness of breath when she is talking. 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  She does not pay attention to her salt intake.  Current Outpatient Prescriptions on File Prior to Visit  Medication Sig Dispense Refill  . atorvastatin (LIPITOR) 20 MG tablet Take 1 tablet (20 mg total) by mouth daily.  90 tablet  3  . bisacodyl (DULCOLAX) 5 MG EC tablet Take 5 mg by mouth daily as needed.        . calcium gluconate 500 MG tablet Take 500 mg by mouth 2 (two) times daily.       . carvedilol (COREG) 3.125 MG tablet 2 qam and 1 qhs   270 tablet  3  . cetirizine (ZYRTEC) 10 MG tablet Take 10 mg by mouth. Taking Every Other Day      . Chlorpheniramine-PSE-Ibuprofen (ADVIL ALLERGY SINUS PO) Take by mouth.        . cycloSPORINE (RESTASIS) 0.05 % ophthalmic emulsion 1 drop 2 (two) times daily.        . DULoxetine (CYMBALTA) 30 MG capsule Take 30 mg by mouth daily. 3 tabs daily        . magnesium gluconate (MAGONATE) 500 MG tablet Take 500 mg by mouth daily.        . metFORMIN (GLUCOPHAGE-XR) 500 MG 24 hr tablet Take 2 tablets (1,000 mg total) by mouth daily with breakfast.  180 tablet  1  . mometasone (NASONEX) 50 MCG/ACT nasal spray Place 2 sprays into the nose.       . Multiple  Vitamin (MULTIVITAMIN) tablet Take 1 tablet by mouth daily.        Marland Kitchen VITAMIN D, CHOLECALCIFEROL, PO Take by mouth.          Allergies  Allergen Reactions  . Amoxicillin     REACTION: unspecified  . Cephalexin     REACTION: unspecified  . Sulfamethoxazole     REACTION: unspecified    Past Medical History  Diagnosis Date  . Osteopenia     dexa -1.7 h -1.35   . Sleep apnea   . Scoliosis     leg length discrepancy  . Allergy   . Depression   . Osteoarthritis   . Skin cancer     basal cell nose  . History of ETOH abuse     recovering  in remission  . Hyperlipidemia   . Dry eyes     Past Surgical History  Procedure Date  . Colonoscopy w/ polypectomy     precancerous   2004  . Tonsillectomy and adenoidectomy 1967  . Nasal sinus surgery 11-13-2008    polyps removed  . Breast biopsy  2003  . Skin cancer removal     basal cell face    History  Smoking status  . Never Smoker   Smokeless tobacco  . Never Used    History  Alcohol Use No    recovering    Family History  Problem Relation Age of Onset  . Arthritis Brother   . Alcohol abuse Brother   . Lymphoma Mother   . Diabetes Mother   . Hyperlipidemia Mother   . Heart failure Father   . Hyperlipidemia Father   . Alcohol abuse Brother     Reviw of Systems:  Reviewed in the HPI.  All other systems are negative.  Physical Exam: Blood pressure 149/84, pulse 80, height 5\' 8"  (1.727 m), weight 201 lb 12.8 oz (91.536 kg). General: Well developed, well nourished, in no acute distress.  Head: Normocephalic, atraumatic, sclera non-icteric, mucus membranes are moist,   Neck: Supple. Negative for carotid bruits. JVD not elevated.  Lungs: Clear bilaterally to auscultation without wheezes, rales, or rhonchi. Breathing is unlabored.  Heart: RRR with S1 S2. No murmurs, rubs, or gallops appreciated.  Abdomen: Soft, non-tender, non-distended with normoactive bowel sounds. No hepatomegaly. No rebound/guarding. No  obvious abdominal masses.  Msk:  Strength and tone appear normal for age.  Extremities: No clubbing or cyanosis. No edema.  Distal pedal pulses are 2+ and equal bilaterally.  Neuro: Alert and oriented X 3. Moves all extremities spontaneously.  Psych:  Responds to questions appropriately with a normal affect.  ECG: From June, 2012 reveals normal sinus rhythm. She has no ST or T wave changes.  Assessment / Plan:

## 2011-11-21 NOTE — Patient Instructions (Signed)
Your physician recommends that you schedule a follow-up appointment in: 3 MONTHS  Your physician has recommended you make the following change in your medication:   INCREASE COREG TO 12.5 MG ONE TABLET TWICE DAILY/ 12 HRS APART  WORK ON DIET AND EXERCISE

## 2011-11-21 NOTE — Assessment & Plan Note (Signed)
Holly Beasley presents with some episodes of shortness breath primarily occur when she is talking. She plays the [place] and does not get short of breath when she is playing her horn.  I've asked her to exercise on a regular basis.  Cluster work on a good diet and exercise program. I'll see her back in the office in 3 months for followup visit. She is to continue working out with her Systems analyst.

## 2012-01-08 ENCOUNTER — Other Ambulatory Visit: Payer: Self-pay | Admitting: *Deleted

## 2012-01-12 ENCOUNTER — Other Ambulatory Visit: Payer: Self-pay | Admitting: *Deleted

## 2012-01-12 MED ORDER — CARVEDILOL 12.5 MG PO TABS: ORAL_TABLET | ORAL | Status: DC

## 2012-01-12 NOTE — Telephone Encounter (Signed)
Fax Received. Refill Completed. Journey Ratterman Chowoe (R.M.A)   

## 2012-01-14 ENCOUNTER — Other Ambulatory Visit: Payer: Self-pay | Admitting: *Deleted

## 2012-01-14 NOTE — Telephone Encounter (Signed)
Opened in Error.

## 2012-01-30 ENCOUNTER — Other Ambulatory Visit (INDEPENDENT_AMBULATORY_CARE_PROVIDER_SITE_OTHER): Payer: PRIVATE HEALTH INSURANCE

## 2012-01-30 DIAGNOSIS — Z131 Encounter for screening for diabetes mellitus: Secondary | ICD-10-CM

## 2012-01-30 LAB — BASIC METABOLIC PANEL
BUN: 12 mg/dL (ref 6–23)
CO2: 24 mEq/L (ref 19–32)
Chloride: 107 mEq/L (ref 96–112)
Creatinine, Ser: 0.6 mg/dL (ref 0.4–1.2)

## 2012-01-30 LAB — LIPID PANEL
LDL Cholesterol: 95 mg/dL (ref 0–99)
Total CHOL/HDL Ratio: 4

## 2012-02-04 ENCOUNTER — Encounter: Payer: Self-pay | Admitting: Internal Medicine

## 2012-02-05 ENCOUNTER — Ambulatory Visit (INDEPENDENT_AMBULATORY_CARE_PROVIDER_SITE_OTHER): Payer: PRIVATE HEALTH INSURANCE | Admitting: Internal Medicine

## 2012-02-05 ENCOUNTER — Encounter: Payer: Self-pay | Admitting: Internal Medicine

## 2012-02-05 VITALS — BP 102/70 | HR 80 | Temp 98.6°F | Wt 196.0 lb

## 2012-02-05 DIAGNOSIS — R7309 Other abnormal glucose: Secondary | ICD-10-CM

## 2012-02-05 DIAGNOSIS — R110 Nausea: Secondary | ICD-10-CM

## 2012-02-05 DIAGNOSIS — E785 Hyperlipidemia, unspecified: Secondary | ICD-10-CM

## 2012-02-05 DIAGNOSIS — R7303 Prediabetes: Secondary | ICD-10-CM

## 2012-02-05 DIAGNOSIS — I10 Essential (primary) hypertension: Secondary | ICD-10-CM

## 2012-02-05 DIAGNOSIS — J309 Allergic rhinitis, unspecified: Secondary | ICD-10-CM

## 2012-02-05 NOTE — Assessment & Plan Note (Signed)
Not felt by cards to be CV but following.

## 2012-02-05 NOTE — Assessment & Plan Note (Signed)
On meds doing better

## 2012-02-05 NOTE — Progress Notes (Signed)
  Subjective:    Patient ID: Holly Beasley, female    DOB: Sep 01, 1951, 61 y.o.   MRN: [id number]  HPI Patient comes in today for follow up of  multiple medical problems.  Since her last visit she has seen Dr. Rometta Emery the cardiologist who did not think she needed more of a stress test but he did increase her carvedilol to 12.5 twice a day. She tolerates this well. She's also been evaluated by Dr. Sharyn Lull allergist who notes that she is allergic to cats mold and pollen. She does have a 36-year-old cat at home. She is now on Singulair omeprazole for possible silent reflux and diabetes the for her nasal allergies. She seems to be stable in a sinus category at this time.  She is on Cymbalta which seems to help her mood exercises with a trainer 2 times a week. 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 Sleep is adequate but broken up at times will stay up late and then have a four-hour nap because of her schedule. No unusual infections numbness falling syncope.  Review of Systems No current chest pain fever weight loss vision changes. Rest as per history of present illness Past history family history social history reviewed in the electronic medical record.     Objective:   Physical Exam BP 102/70  Pulse 80  Temp(Src) 98.6 F (37 C) (Oral)  Wt 196 lb (88.905 kg)  SpO2 97% Well-developed well-nourished in no acute distress affect appears normal and light her today. HEENT no gross abnormalities Neck supple without masses or bruit or adenopathy Chest:  Clear to A&P without wheezes rales or rhonchi has slight kyphosis. CV:  S1-S2 no gallops or murmurs peripheral perfusion is normal Abdomen:  Sof,t normal bowel sounds without hepatosplenomegaly, no guarding rebound or masses no CVA tenderness Lab Results  Component Value Date   HGBA1C 6.8* 01/30/2012   Lab Results  Component Value Date   CHOL 169 01/30/2012   CHOL 190 03/13/2011   CHOL 178 02/20/2010   Lab Results  Component  Value Date   HDL 45.80 01/30/2012   HDL 08.65 03/13/2011   HDL 78.46 02/20/2010   Lab Results  Component Value Date   LDLCALC 95 01/30/2012   LDLCALC 107* 03/13/2011   LDLCALC 106* 02/20/2010   Lab Results  Component Value Date   TRIG 142.0 01/30/2012   TRIG 165.0* 03/13/2011   TRIG 131.0 02/20/2010   Lab Results  Component Value Date   CHOLHDL 4 01/30/2012   CHOLHDL 4 03/13/2011   CHOLHDL 4 02/20/2010   Lab Results  Component Value Date   LDLDIRECT 162.1 10/20/2007   LDLDIRECT 232.7 08/02/2007   LDLDIRECT 170.7 02/01/2007      Assessment & Plan:  Dm early  Vs prediabetes Stable   Discussed intensification of lifestyle /maintain on same medicines for now May need to increase as we go along.  Lipids improved at goal.  Allergic under care history of recurrent sinusitis stable.  Mood stable on Cymbalta  Counseled about sleep nutrition etc .  SOB and nausea   PLan  cpx and lab full set in about 4 months or as needed

## 2012-02-05 NOTE — Patient Instructions (Addendum)
Continue appropriate diet exercise and lifestyle changes At this time we will not change medication.  You may want to track her sleep and  oral intake for a few weeks to help make changes. 3500 calories is the energy content of a pound of body weight .Must have a 3500 cal deficit to lose one pound . Thus decrease 500 calorie equivalent per day in food or drink intake / or exercise  for 7 days to lose one pound. 100 calories extra per day can add 10 pounds per year.   CPX with labs in 4 months or so.   To check on shingles vaccine coverage.  Before next visit

## 2012-02-18 ENCOUNTER — Ambulatory Visit: Payer: PRIVATE HEALTH INSURANCE | Admitting: Cardiovascular Disease

## 2012-02-22 ENCOUNTER — Other Ambulatory Visit: Payer: Self-pay | Admitting: Internal Medicine

## 2012-05-31 ENCOUNTER — Other Ambulatory Visit (INDEPENDENT_AMBULATORY_CARE_PROVIDER_SITE_OTHER): Payer: PRIVATE HEALTH INSURANCE

## 2012-05-31 DIAGNOSIS — Z0000 Encounter for general adult medical examination without abnormal findings: Secondary | ICD-10-CM

## 2012-05-31 LAB — CBC WITH DIFFERENTIAL/PLATELET
Basophils Absolute: 0.1 10*3/uL (ref 0.0–0.1)
Eosinophils Relative: 4.7 % (ref 0.0–5.0)
Lymphs Abs: 2.5 10*3/uL (ref 0.7–4.0)
MCV: 93.4 fl (ref 78.0–100.0)
Monocytes Absolute: 0.5 10*3/uL (ref 0.1–1.0)
Neutrophils Relative %: 40.4 % — ABNORMAL LOW (ref 43.0–77.0)
Platelets: 251 10*3/uL (ref 150.0–400.0)
RDW: 13.1 % (ref 11.5–14.6)
WBC: 5.7 10*3/uL (ref 4.5–10.5)

## 2012-05-31 LAB — LIPID PANEL
Cholesterol: 190 mg/dL (ref 0–200)
HDL: 49.4 mg/dL (ref >39.00)
LDL Cholesterol: 103 mg/dL — ABNORMAL HIGH (ref 0–99)
Total CHOL/HDL Ratio: 4
Triglycerides: 189 mg/dL — ABNORMAL HIGH (ref 0.0–149.0)
VLDL: 37.8 mg/dL (ref 0.0–40.0)

## 2012-05-31 LAB — POCT URINALYSIS DIPSTICK
Ketones, UA: NEGATIVE
Protein, UA: NEGATIVE
Spec Grav, UA: 1.015
Urobilinogen, UA: 0.2
pH, UA: 7

## 2012-05-31 LAB — BASIC METABOLIC PANEL
BUN: 12 mg/dL (ref 6–23)
Creatinine, Ser: 0.8 mg/dL (ref 0.4–1.2)
GFR: 79.74 mL/min (ref >60.00)
Glucose, Bld: 139 mg/dL — ABNORMAL HIGH (ref 70–99)

## 2012-05-31 LAB — HEPATIC FUNCTION PANEL
ALT: 19 U/L (ref 0–35)
Bilirubin, Direct: 0 mg/dL (ref 0.0–0.3)
Total Bilirubin: 0.3 mg/dL (ref 0.3–1.2)

## 2012-06-07 ENCOUNTER — Ambulatory Visit (INDEPENDENT_AMBULATORY_CARE_PROVIDER_SITE_OTHER): Payer: PRIVATE HEALTH INSURANCE | Admitting: Internal Medicine

## 2012-06-07 ENCOUNTER — Encounter: Payer: Self-pay | Admitting: Internal Medicine

## 2012-06-07 VITALS — BP 144/84 | HR 69 | Temp 98.0°F | Ht 68.5 in | Wt 195.0 lb

## 2012-06-07 DIAGNOSIS — R7309 Other abnormal glucose: Secondary | ICD-10-CM

## 2012-06-07 DIAGNOSIS — R7303 Prediabetes: Secondary | ICD-10-CM

## 2012-06-07 DIAGNOSIS — Z0000 Encounter for general adult medical examination without abnormal findings: Secondary | ICD-10-CM

## 2012-06-07 DIAGNOSIS — E785 Hyperlipidemia, unspecified: Secondary | ICD-10-CM

## 2012-06-07 DIAGNOSIS — L309 Dermatitis, unspecified: Secondary | ICD-10-CM

## 2012-06-07 DIAGNOSIS — Z2911 Encounter for prophylactic immunotherapy for respiratory syncytial virus (RSV): Secondary | ICD-10-CM

## 2012-06-07 DIAGNOSIS — I10 Essential (primary) hypertension: Secondary | ICD-10-CM

## 2012-06-07 DIAGNOSIS — M4120 Other idiopathic scoliosis, site unspecified: Secondary | ICD-10-CM

## 2012-06-07 DIAGNOSIS — L259 Unspecified contact dermatitis, unspecified cause: Secondary | ICD-10-CM

## 2012-06-07 MED ORDER — ATORVASTATIN CALCIUM 20 MG PO TABS: 20.0000 mg | ORAL_TABLET | Freq: Every day | ORAL | Status: DC

## 2012-06-07 NOTE — Patient Instructions (Signed)
Continue lifestyle intervention healthy eating and exercise . This is to control your blood sugar and decrease your cardiovascular risk.Marland Kitchen  No change in medications at this time. Try the Protopic as Dr. Sharyn Lull recommended for the feet if not helpful after 2 weeks I would suggest you see a dermatologist regarding  foot dermatitis.  Followup visit in 6 months with hemoglobin A1c pre-visit  Shingles vaccine today.

## 2012-06-07 NOTE — Progress Notes (Signed)
Subjective:    Patient ID: Holly Beasley, female    DOB: 04-01-1951, 61 y.o.   MRN: [id number]  HPI Patient comes in today for preventive visit and follow-up of medical issues. Update  history since  last visit:  Has seen dr Sharyn Lull  on allergy suppression and gerd rx  Allergic to mold and cats  Less sinus infections so far . On singulair and prilosec now.  And omnaris  Seen July to go back in 6 months .  BG  ocass low feeling  On metformin without se . No vision change numbness  Poly uria polydypsia  LIPIDS nose of meds  Asks about asssoic of statins and dm . Risk.  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  Sleep takes naps some    Review of Systems ROS:  GEN/ HEENT: No fever, significant weight changes sweats headaches vision problems hearing changes, CV/ PULM; No chest pain shortness of breath cough, syncope,edema  change in exercise tolerance. Get tired  Uses trainer  GI /GU: No adominal pain, vomiting, change in bowel habits. No blood in the stool. No significant GU symptoms. SKIN/HEME: ,no  suspicious lesions or bleeding. No lymphadenopathy, nodules, masses.  NEURO/ PSYCH:  No neurologic signs such as weakness numbness. No depression anxiety. IMM/ Allergy: No unusual infections.  Allergy .  As above under care  REST of 12 system review negative except as per HPI  Outpatient Encounter Prescriptions as of 06/07/2012  Medication Sig Dispense Refill  . B Complex Vitamins (VITAMIN B COMPLEX PO) Take 2 tablets by mouth daily.      . calcium gluconate 500 MG tablet Take 500 mg by mouth 2 (two) times daily.       . carvedilol (COREG) 12.5 MG tablet ONE TABLET BY MOUTH TWO TIMES DAILY  180 tablet  3  . ciclesonide (OMNARIS) 50 MCG/ACT nasal spray Place 2 sprays into both nostrils daily.      .  cycloSPORINE (RESTASIS) 0.05 % ophthalmic emulsion 1 drop 2 (two) times daily.        . DULoxetine (CYMBALTA) 30 MG capsule Take 30 mg by mouth daily. 3 tabs daily        . fish oil-omega-3 fatty acids 1000 MG capsule Take 1 g by mouth daily.      Marland Kitchen LYSINE PO Take 500 mg by mouth daily.      . magnesium gluconate (MAGONATE) 500 MG tablet Take 500 mg by mouth daily.        . metFORMIN (GLUCOPHAGE-XR) 500 MG 24 hr tablet TAKE 2 TABLETS EVERY DAY WITH BREAKFAST  180 tablet  1  . montelukast (SINGULAIR) 10 MG tablet       . Multiple Vitamin (MULTIVITAMIN) tablet Take 1 tablet by mouth daily.        Marland Kitchen omeprazole (PRILOSEC) 40 MG capsule       . tacrolimus (PROTOPIC) 0.1 % ointment Apply topically 2 (two) times daily.      Marland Kitchen VITAMIN D, CHOLECALCIFEROL, PO Take by mouth.        Marland Kitchen atorvastatin (LIPITOR) 20 MG tablet Take 1 tablet (20 mg total) by mouth daily.  90 tablet  3  . DISCONTD: atorvastatin (LIPITOR) 20 MG tablet Take 1 tablet (20 mg total)  by mouth daily.  90 tablet  3  . DISCONTD: Azelastine-Fluticasone (DYMISTA) 137-50 MCG/ACT SUSP Place into the nose 2 (two) times daily.      Marland Kitchen DISCONTD: bisacodyl (DULCOLAX) 5 MG EC tablet Take 5 mg by mouth daily as needed.        Marland Kitchen DISCONTD: cetirizine (ZYRTEC) 10 MG tablet Take 10 mg by mouth. Taking Every Other Day      . DISCONTD: Chlorpheniramine-PSE-Ibuprofen (ADVIL ALLERGY SINUS PO) Take by mouth.        . DISCONTD: Chromium 500 MCG TABS Take 500 mcg by mouth daily.      Marland Kitchen DISCONTD: halobetasol (ULTRAVATE) 0.05 % ointment       . DISCONTD: Loratadine (CLARITIN PO) Take by mouth. Taking Every Other Day      . DISCONTD: mometasone (NASONEX) 50 MCG/ACT nasal spray Place 2 sprays into the nose.        Past history family history social history reviewed in the electronic medical record.      Objective:   Physical Exam  BP 144/84  Pulse 69  Temp 98 F (36.7 C) (Oral)  Ht 5' 8.5" (1.74 m)  Wt 195 lb (88.451 kg)  BMI 29.22 kg/m2  SpO2  96% Repeat BP 142/70 left arm reg cuff.  Physical Exam: Vital signs reviewed NWG:NFAO is a well-developed well-nourished alert cooperative  white female who appears her stated age in no acute distress.  HEENT: normocephalic atraumatic , face slightly asymmetric  Eyes: PERRL EOM's full, conjunctiva clear, Nares: paten,t no deformity discharge or tenderness., Ears: no deformity EAC's clear TMs with normal landmarks. Mouth: clear OP, no lesions, edema.  Moist mucous membranes. Dentition in adequate repair. NECK: supple without masses, thyromegaly or bruits. CHEST/PULM:  Clear to auscultation and percussion breath sounds equal no wheeze , rales or rhonchi. No chest wall deformities or tenderness. Breast: normal by inspection . No dimpling, discharge, masses, tenderness or discharge . CV: PMI is nondisplaced, S1 S2 no gallops, murmurs, rubs. Peripheral pulses are full without delay.No JVD .  ABDOMEN: Bowel sounds normal nontender  No guard or rebound, no hepato splenomegal no CVA tenderness.   Extremtities:  No clubbing cyanosis or edema, no acute joint swelling or redness no focal atrophy has scoliosis  NEURO:  Oriented x3, cranial nerves 3-12 appear to be intact, no obvious focal weakness,gait within normal limits no abnormal reflexes or asymmetrical SKIN:  normal turgor, color, no bruising or petechiae. Left had with faded rash vs callus  Both feet soles with hyperkeratotis rash and cracking no oozing ulcer    Top of feet nl  Nails are painted  Nl pulses  PSYCH: Oriented, good eye contact, no obvious depression anxiety, cognition and judgment appear normal. LN: no cervical axillary inguinal adenopathy  Lab Results  Component Value Date   WBC 5.7 05/31/2012   HGB 13.0 05/31/2012   HCT 38.8 05/31/2012   PLT 251.0 05/31/2012   GLUCOSE 139* 05/31/2012   CHOL 190 05/31/2012   TRIG 189.0* 05/31/2012   HDL 49.40 05/31/2012   LDLDIRECT 162.1 10/20/2007   LDLCALC 103* 05/31/2012   ALT 19 05/31/2012   AST 23  05/31/2012   NA 140 05/31/2012   K 4.1 05/31/2012   CL 107 05/31/2012   CREATININE 0.8 05/31/2012   BUN 12 05/31/2012   CO2 27 05/31/2012   TSH 1.58 05/31/2012   HGBA1C 6.7* 05/31/2012       Assessment & Plan:   Preventive Health Care Counseled regarding healthy nutrition,  exercise, sleep, injury prevention, calcium vit d and healthy weight . zostovax today     HT up borderline today   Check at home to ensure at goal under 140/90   Prediabetes fam hx  Disc poss association with statins but she has risk  From fam hx predating this  At this time has no se so continuing on meds   LIPIDS  On med refill today  DERM; atopic derm  And foot dermatitis   Needs fu if not  Improved or controlled with plan   Allergic rhin gerd   And recurrent sinusiits stable at this time.  remote hx of etoh stable  Scoliosis and gait  Effect no change   Recheck 6 months hg a1c pre visit.  Or as needed

## 2012-06-07 NOTE — Addendum Note (Signed)
Addended by: Raj Janus on: 06/07/2012 01:50 PM   Modules accepted: Orders

## 2012-07-13 ENCOUNTER — Ambulatory Visit (INDEPENDENT_AMBULATORY_CARE_PROVIDER_SITE_OTHER): Payer: PRIVATE HEALTH INSURANCE

## 2012-07-13 DIAGNOSIS — Z23 Encounter for immunization: Secondary | ICD-10-CM

## 2012-09-06 ENCOUNTER — Other Ambulatory Visit: Payer: Self-pay | Admitting: Internal Medicine

## 2012-12-05 ENCOUNTER — Other Ambulatory Visit: Payer: Self-pay | Admitting: Internal Medicine

## 2013-01-24 ENCOUNTER — Telehealth: Payer: Self-pay | Admitting: *Deleted

## 2013-01-24 ENCOUNTER — Other Ambulatory Visit: Payer: Self-pay | Admitting: Internal Medicine

## 2013-01-24 NOTE — Telephone Encounter (Signed)
spoke to pt to make 1 yr ov with Dr.Nahser. pt states she is not having any heart symptoms so she don't think she need to make an appointment. pt agreed to have her Carvedilol sent to her PCP instead. Pharmacy is aware.

## 2013-01-27 ENCOUNTER — Other Ambulatory Visit (INDEPENDENT_AMBULATORY_CARE_PROVIDER_SITE_OTHER): Payer: PRIVATE HEALTH INSURANCE

## 2013-01-27 DIAGNOSIS — R7303 Prediabetes: Secondary | ICD-10-CM

## 2013-01-27 DIAGNOSIS — R7309 Other abnormal glucose: Secondary | ICD-10-CM

## 2013-01-27 LAB — HEMOGLOBIN A1C: Hgb A1c MFr Bld: 6.7 % — ABNORMAL HIGH (ref 4.6–6.5)

## 2013-01-31 ENCOUNTER — Ambulatory Visit (INDEPENDENT_AMBULATORY_CARE_PROVIDER_SITE_OTHER): Payer: PRIVATE HEALTH INSURANCE | Admitting: Internal Medicine

## 2013-01-31 ENCOUNTER — Encounter: Payer: Self-pay | Admitting: Internal Medicine

## 2013-01-31 VITALS — BP 130/72 | HR 72 | Temp 97.9°F | Wt 193.0 lb

## 2013-01-31 DIAGNOSIS — E785 Hyperlipidemia, unspecified: Secondary | ICD-10-CM

## 2013-01-31 DIAGNOSIS — I10 Essential (primary) hypertension: Secondary | ICD-10-CM

## 2013-01-31 DIAGNOSIS — K219 Gastro-esophageal reflux disease without esophagitis: Secondary | ICD-10-CM

## 2013-01-31 DIAGNOSIS — R7309 Other abnormal glucose: Secondary | ICD-10-CM

## 2013-01-31 DIAGNOSIS — R7303 Prediabetes: Secondary | ICD-10-CM

## 2013-01-31 MED ORDER — CARVEDILOL 12.5 MG PO TABS: 12.5000 mg | ORAL_TABLET | Freq: Two times a day (BID) | ORAL | Status: DC

## 2013-01-31 NOTE — Progress Notes (Signed)
Chief Complaint  Patient presents with  . Follow-up    Needs a refill of carvedilol.    HPI: Patient comes in today for follow up of  multiple medical problems.    Bp  Asks for bp medication refill  And   On 12.5 bid  And no exercise sx .    bp readings not checked   Prediabetes : decreasing  Sweets   And exercising more  No se at present.  No new numbness or vision changes   Sinus infection recurrent and some PND  Seeing ent   and  Being rx for reflux .  In addition to allergic regimen now on prilosec   LIPIDS no se   Still acitve trying to exercise   ROS: See pertinent positives and negatives per HPI. No feet ulcers or numbness   Past Medical History  Diagnosis Date  . Osteopenia     dexa -1.7 h -1.35   . Sleep apnea   . Scoliosis     leg length discrepancy  . Allergy   . Depression   . Osteoarthritis   . Skin cancer     basal cell nose  . History of ETOH abuse     recovering  in remission  . Hyperlipidemia   . Dry eyes     Family History  Problem Relation Age of Onset  . Arthritis Brother   . Alcohol abuse Brother   . Lymphoma Mother   . Diabetes Mother   . Hyperlipidemia Mother   . Heart failure Father   . Hyperlipidemia Father   . Alcohol abuse Brother     History   Social History  . Marital Status: Married    Spouse Name: N/A    Number of Children: N/A  . Years of Education: N/A   Social History Main Topics  . Smoking status: Never Smoker   . Smokeless tobacco: Never Used  . Alcohol Use: No     Comment: recovering  . Drug Use: No  . Sexually Active: None   Other Topics Concern  . 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 atorvastatin (LIPITOR) 20 MG tablet Take 1 tablet (20 mg total) by  mouth daily.  90 tablet  3  . calcium gluconate 500 MG tablet Take 500 mg by mouth 2 (two) times daily.       . carvedilol (COREG) 12.5 MG tablet Take 1 tablet (12.5 mg total) by mouth 2 (two) times daily with a meal. ONE TABLET BY MOUTH TWO TIMES DAILY  180 tablet  3  . ciclesonide (OMNARIS) 50 MCG/ACT nasal spray Place 2 sprays into both nostrils daily.      . cycloSPORINE (RESTASIS) 0.05 % ophthalmic emulsion Place 1 drop into both eyes at bedtime as needed.       . DULoxetine (CYMBALTA) 30 MG capsule Take 30 mg by mouth daily. 3 tabs daily        . fish oil-omega-3 fatty acids 1000 MG capsule Take 1 g by mouth daily.      . metFORMIN (GLUCOPHAGE-XR) 500 MG 24 hr tablet       .  montelukast (SINGULAIR) 10 MG tablet       . Multiple Vitamin (MULTIVITAMIN) tablet Take 1 tablet by mouth daily.        Marland Kitchen omeprazole (PRILOSEC) 40 MG capsule Take 40 mg by mouth daily.       Marland Kitchen VITAMIN D, CHOLECALCIFEROL, PO Take by mouth.        . [DISCONTINUED] carvedilol (COREG) 12.5 MG tablet ONE TABLET BY MOUTH TWO TIMES DAILY      . [DISCONTINUED] B Complex Vitamins (VITAMIN B COMPLEX PO) Take 2 tablets by mouth daily.      . [DISCONTINUED] LYSINE PO Take 500 mg by mouth daily.      . [DISCONTINUED] magnesium gluconate (MAGONATE) 500 MG tablet Take 500 mg by mouth daily.        . [DISCONTINUED] metFORMIN (GLUCOPHAGE-XR) 500 MG 24 hr tablet TAKE 2 TABLETS BY MOUTH ONCE DAILY WITH BREAKFAST  180 tablet  0  . [DISCONTINUED] tacrolimus (PROTOPIC) 0.1 % ointment Apply topically 2 (two) times daily.       No facility-administered encounter medications on file as of 01/31/2013.    EXAM:  BP 130/72  Pulse 72  Temp(Src) 97.9 F (36.6 C) (Oral)  Wt 193 lb (87.544 kg)  BMI 28.92 kg/m2  SpO2 93%  Body mass index is 28.92 kg/(m^2). Wt Readings from Last 3 Encounters:  01/31/13 193 lb (87.544 kg)  06/07/12 195 lb (88.451 kg)  02/05/12 196 lb (88.905 kg)    GENERAL: vitals reviewed and listed above, alert,  oriented, appears well hydrated and in no acute distress minimally hoarse   HEENT: atraumatic, conjunctiva  clear, no obvious abnormalities on inspection of external nose and ears OP : no lesion edema or exudate   NECK: no obvious masses on inspection palpation  No adenopathy   LUNGS: clear to auscultation bilaterally, no wheezes, rales or rhonchi, good air movement  CV: HRRR, no clubbing cyanosis or  peripheral edema nl cap refill   MS: moves all extremities without noticeable focal  abnormality  PSYCH: pleasant and cooperative, no obvious depression or anxiety Lab Results  Component Value Date   WBC 5.7 05/31/2012   HGB 13.0 05/31/2012   HCT 38.8 05/31/2012   PLT 251.0 05/31/2012   GLUCOSE 139* 05/31/2012   CHOL 190 05/31/2012   TRIG 189.0* 05/31/2012   HDL 49.40 05/31/2012   LDLDIRECT 162.1 10/20/2007   LDLCALC 103* 05/31/2012   ALT 19 05/31/2012   AST 23 05/31/2012   NA 140 05/31/2012   K 4.1 05/31/2012   CL 107 05/31/2012   CREATININE 0.8 05/31/2012   BUN 12 05/31/2012   CO2 27 05/31/2012   TSH 1.58 05/31/2012   HGBA1C 6.7* 01/27/2013    ASSESSMENT AND PLAN:  Discussed the following assessment and plan:  Prediabetes - a1c in the early diabetes range  continue metformin and  adn lsi options discussed  GERD  HYPERTENSION  HYPERLIPIDEMIA  -Patient advised to return or notify health care team  if symptoms worsen or persist or new concerns arise.  Patient Instructions  Stay on the same medication at this time.  Continue lifestyle intervention healthy eating and exercise . Can take the metformin once a day or split  Dose .   ROV  Preventive visit with hg a1c in the fall.      Neta Mends. Harman Ferrin M.D.

## 2013-01-31 NOTE — Patient Instructions (Addendum)
Stay on the same medication at this time.  Continue lifestyle intervention healthy eating and exercise . Can take the metformin once a day or split  Dose .   ROV  Preventive visit with hg a1c in the fall.

## 2013-03-03 ENCOUNTER — Other Ambulatory Visit: Payer: Self-pay | Admitting: Internal Medicine

## 2013-04-25 ENCOUNTER — Other Ambulatory Visit: Payer: Self-pay

## 2013-04-25 DIAGNOSIS — Z1231 Encounter for screening mammogram for malignant neoplasm of breast: Secondary | ICD-10-CM

## 2013-05-18 ENCOUNTER — Ambulatory Visit
Admission: RE | Admit: 2013-05-18 | Discharge: 2013-05-18 | Disposition: A | Discharge status: Home / Self Care | Payer: PRIVATE HEALTH INSURANCE | Source: Ambulatory Visit

## 2013-05-18 DIAGNOSIS — Z1231 Encounter for screening mammogram for malignant neoplasm of breast: Secondary | ICD-10-CM

## 2013-06-01 ENCOUNTER — Other Ambulatory Visit: Payer: Self-pay | Admitting: Internal Medicine

## 2013-06-07 ENCOUNTER — Other Ambulatory Visit: Payer: Self-pay | Admitting: Internal Medicine

## 2013-07-05 ENCOUNTER — Ambulatory Visit (INDEPENDENT_AMBULATORY_CARE_PROVIDER_SITE_OTHER): Payer: PRIVATE HEALTH INSURANCE

## 2013-07-05 ENCOUNTER — Other Ambulatory Visit (INDEPENDENT_AMBULATORY_CARE_PROVIDER_SITE_OTHER): Payer: PRIVATE HEALTH INSURANCE

## 2013-07-05 DIAGNOSIS — Z0000 Encounter for general adult medical examination without abnormal findings: Secondary | ICD-10-CM

## 2013-07-05 DIAGNOSIS — E785 Hyperlipidemia, unspecified: Secondary | ICD-10-CM

## 2013-07-05 DIAGNOSIS — Z23 Encounter for immunization: Secondary | ICD-10-CM

## 2013-07-05 LAB — BASIC METABOLIC PANEL WITH GFR
BUN: 10 mg/dL (ref 6–23)
CO2: 25 meq/L (ref 19–32)
Calcium: 9.2 mg/dL (ref 8.4–10.5)
Chloride: 106 meq/L (ref 96–112)
Creatinine, Ser: 0.8 mg/dL (ref 0.4–1.2)
GFR: 76.07 mL/min
Glucose, Bld: 153 mg/dL — ABNORMAL HIGH (ref 70–99)
Potassium: 4 meq/L (ref 3.5–5.1)
Sodium: 139 meq/L (ref 135–145)

## 2013-07-05 LAB — LIPID PANEL
Cholesterol: 207 mg/dL — ABNORMAL HIGH (ref 0–200)
HDL: 52.6 mg/dL
Total CHOL/HDL Ratio: 4
Triglycerides: 108 mg/dL (ref 0.0–149.0)
VLDL: 21.6 mg/dL (ref 0.0–40.0)

## 2013-07-05 LAB — CBC WITH DIFFERENTIAL/PLATELET
Basophils Absolute: 0.1 10*3/uL (ref 0.0–0.1)
Basophils Relative: 0.9 % (ref 0.0–3.0)
Eosinophils Absolute: 0.3 10*3/uL (ref 0.0–0.7)
Eosinophils Relative: 5.6 % — ABNORMAL HIGH (ref 0.0–5.0)
HCT: 38.6 % (ref 36.0–46.0)
Hemoglobin: 13.2 g/dL (ref 12.0–15.0)
Lymphocytes Relative: 37.6 % (ref 12.0–46.0)
Lymphs Abs: 2.3 10*3/uL (ref 0.7–4.0)
MCHC: 34.2 g/dL (ref 30.0–36.0)
MCV: 92.1 fl (ref 78.0–100.0)
Monocytes Absolute: 0.6 10*3/uL (ref 0.1–1.0)
Monocytes Relative: 9.1 % (ref 3.0–12.0)
Neutro Abs: 2.9 10*3/uL (ref 1.4–7.7)
Neutrophils Relative %: 46.8 % (ref 43.0–77.0)
Platelets: 249 10*3/uL (ref 150.0–400.0)
RBC: 4.19 Mil/uL (ref 3.87–5.11)
RDW: 13.1 % (ref 11.5–14.6)
WBC: 6.1 10*3/uL (ref 4.5–10.5)

## 2013-07-05 LAB — HEPATIC FUNCTION PANEL
ALT: 22 U/L (ref 0–35)
AST: 23 U/L (ref 0–37)
Albumin: 4 g/dL (ref 3.5–5.2)
Alkaline Phosphatase: 68 U/L (ref 39–117)
Total Protein: 7.2 g/dL (ref 6.0–8.3)

## 2013-07-05 LAB — HM DIABETES EYE EXAM

## 2013-07-05 LAB — TSH: TSH: 1.22 u[IU]/mL (ref 0.35–5.50)

## 2013-07-05 LAB — LDL CHOLESTEROL, DIRECT: Direct LDL: 131 mg/dL

## 2013-07-12 ENCOUNTER — Ambulatory Visit (INDEPENDENT_AMBULATORY_CARE_PROVIDER_SITE_OTHER): Payer: PRIVATE HEALTH INSURANCE | Admitting: Internal Medicine

## 2013-07-12 ENCOUNTER — Encounter: Payer: Self-pay | Admitting: Internal Medicine

## 2013-07-12 VITALS — BP 130/80 | HR 69 | Temp 97.9°F | Ht 68.5 in | Wt 198.0 lb

## 2013-07-12 DIAGNOSIS — Z23 Encounter for immunization: Secondary | ICD-10-CM

## 2013-07-12 DIAGNOSIS — K219 Gastro-esophageal reflux disease without esophagitis: Secondary | ICD-10-CM

## 2013-07-12 DIAGNOSIS — M4120 Other idiopathic scoliosis, site unspecified: Secondary | ICD-10-CM

## 2013-07-12 DIAGNOSIS — R7303 Prediabetes: Secondary | ICD-10-CM

## 2013-07-12 DIAGNOSIS — Z0000 Encounter for general adult medical examination without abnormal findings: Secondary | ICD-10-CM

## 2013-07-12 DIAGNOSIS — M419 Scoliosis, unspecified: Secondary | ICD-10-CM

## 2013-07-12 DIAGNOSIS — E785 Hyperlipidemia, unspecified: Secondary | ICD-10-CM

## 2013-07-12 DIAGNOSIS — M2170 Unequal limb length (acquired), unspecified site: Secondary | ICD-10-CM

## 2013-07-12 DIAGNOSIS — I10 Essential (primary) hypertension: Secondary | ICD-10-CM

## 2013-07-12 DIAGNOSIS — R7309 Other abnormal glucose: Secondary | ICD-10-CM

## 2013-07-12 DIAGNOSIS — G4733 Obstructive sleep apnea (adult) (pediatric): Secondary | ICD-10-CM

## 2013-07-12 MED ORDER — METFORMIN HCL ER 500 MG PO TB24: ORAL_TABLET | ORAL | Status: DC

## 2013-07-12 MED ORDER — OMEPRAZOLE 40 MG PO CPDR: 40.0000 mg | DELAYED_RELEASE_CAPSULE | Freq: Every day | ORAL | Status: DC

## 2013-07-12 MED ORDER — MONTELUKAST SODIUM 10 MG PO TABS: 10.0000 mg | ORAL_TABLET | Freq: Every day | ORAL | Status: DC

## 2013-07-12 NOTE — Progress Notes (Signed)
Chief Complaint  Patient presents with  . 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  Diet snapples.  Avoiding sugars but sweets are a weakness  abstinent from etoh  To see plovsky as Dr Raquel James retired ;  Now on  cymbalta 90 mg .  Metformin Taking 500 mg bid   No se of Lipitor  On Prilosec and Singulair per allergist with help  Can we take over management and rx? No numbness but uses cream on flaking callous on feet . strong fam hx of dm  ROS:  GEN/ HEENT: No fever, significant weight changes sweats headaches vision problems hearing changes, CV/ PULM; No chest pain  New shortness of breath cough, syncope,edema  change in exercise tolerance. GI /GU: No adominal pain, vomiting, change in bowel habits. No blood in the stool. No significant GU symptoms. SKIN/HEME: ,no acute skin rashes suspicious lesions or bleeding. No lymphadenopathy, nodules, masses.  NEURO/ PSYCH:  No neurologic signs such as weakness numbness. No depression anxiety. IMM/ Allergy: No unusual infections.  Allergy .   REST of 12 system review negative except as per HPI   Past Medical History  Diagnosis Date  . Osteopenia     dexa -1.7 h -1.35   . Sleep apnea   . Scoliosis     leg length discrepancy  . Allergy   . Depression   . Osteoarthritis   . Skin cancer     basal cell nose  . History of ETOH abuse     recovering  in remission  . Hyperlipidemia   . Dry eyes     Family History  Problem Relation Age of Onset  . Arthritis Brother   . Alcohol abuse Brother   . Lymphoma Mother   . Diabetes Mother   . Hyperlipidemia Mother   . Heart failure Father   . Hyperlipidemia Father   . Alcohol abuse Brother     History   Social History  . Marital Status: Married    Spouse Name: N/A    Number of Children: N/A  . Years of Education: N/A   Social History Main Topics  . Smoking status: Never Smoker   . Smokeless tobacco:  Never Used  . Alcohol Use: No     Comment: recovering  . Drug Use: No  . Sexual Activity: None   Other Topics Concern  . 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 atorvastatin (LIPITOR) 20 MG tablet TAKE 1 TABLET BY MOUTH EVERY DAY  90 tablet  0  . calcipotriene-betamethasone (TACLONEX) ointment Apply 1 application topically daily.      . calcium gluconate 500 MG tablet Take 500 mg by mouth 2 (two) times daily.       . carvedilol (COREG) 12.5 MG tablet Take 1 tablet (12.5 mg total) by mouth 2 (two) times daily with a meal. ONE TABLET BY MOUTH TWO TIMES DAILY  180 tablet  3  . ciclesonide (OMNARIS) 50 MCG/ACT nasal spray Place 2 sprays into both  nostrils daily.      . cycloSPORINE (RESTASIS) 0.05 % ophthalmic emulsion Place 1 drop into both eyes at bedtime as needed.       . DULoxetine (CYMBALTA) 30 MG capsule Take 30 mg by mouth daily. 3 tabs daily        . fish oil-omega-3 fatty acids 1000 MG capsule Take 1 g by mouth daily.      . metFORMIN (GLUCOPHAGE-XR) 500 MG 24 hr tablet Take 3 per day  270 tablet  3  . montelukast (SINGULAIR) 10 MG tablet Take 1 tablet (10 mg total) by mouth at bedtime.  90 tablet  3  . Multiple Vitamin (MULTIVITAMIN) tablet Take 1 tablet by mouth daily.        Marland Kitchen omeprazole (PRILOSEC) 40 MG capsule Take 1 capsule (40 mg total) by mouth daily.  90 capsule  3  . VITAMIN D, CHOLECALCIFEROL, PO Take by mouth.        . [DISCONTINUED] metFORMIN (GLUCOPHAGE-XR) 500 MG 24 hr tablet TAKE 2 TABLETS BY MOUTH ONCE DAILY WITH BREAKFAST  180 tablet  0  . [DISCONTINUED] montelukast (SINGULAIR) 10 MG tablet       . [DISCONTINUED] omeprazole (PRILOSEC) 40 MG capsule Take 40 mg by mouth daily.        No  facility-administered encounter medications on file as of 07/12/2013.    EXAM:  BP 130/80  Pulse 69  Temp(Src) 97.9 F (36.6 C) (Oral)  Ht 5' 8.5" (1.74 m)  Wt 198 lb (89.812 kg)  BMI 29.66 kg/m2  SpO2 95%  Body mass index is 29.66 kg/(m^2).  Physical Exam: Vital signs reviewed [id number] is a well-developed well-nourished alert cooperative   female who appears her stated age in no acute distress.  HEENT: normocephalic atraumatic , Eyes: PERRL EOM's full, conjunctiva clear, Nares: paten,t no deformity discharge or tenderness., Ears: no deformity EAC's clear TMs with normal landmarks. Mouth: clear OP, no lesions, edema.  Moist mucous membranes. Dentition in adequate repair. NECK: supple without masses, thyromegaly or bruits. CHEST/PULM:  Clear to auscultation and percussion breath sounds equal no wheeze , rales or rhonchi. No chest wall deformities or tenderness. CV: PMI is nondisplaced, S1 S2 no gallops, murmurs, rubs. Peripheral pulses are full without delay.No JVD .  Abdomen:  Sof,t normal bowel sounds without hepatosplenomegaly, no guarding rebound or masses no CVA tenderness ABDOMEN: Bowel sounds normal nontender  No guard or rebound, no hepato splenomegal no CVA tenderness.  No hernia. Extremtities:  No clubbing cyanosis or edema, no acute joint swelling or redness no focal atrophy Has scoliosis  NEURO:  Oriented x3, cranial nerves 3-12 appear to be intact, no obvious focal weakness,gait within normal limits no abnormal reflexes or asymmetrical SKIN: No acute rashes normal turgor, color, no bruising or petechiae. Feet scaly patches   PSYCH: Oriented, good eye contact, no obvious depression anxiety, cognition and judgment appear normal. LN: no cervical axillary inguinal adenopathy  Lab Results  Component Value Date   WBC 6.1 07/05/2013   HGB 13.2 07/05/2013   HCT 38.6 07/05/2013   PLT 249.0 07/05/2013   GLUCOSE 153* 07/05/2013   CHOL 207* 07/05/2013   TRIG 108.0 07/05/2013   HDL  52.60 07/05/2013   LDLDIRECT 131.0 07/05/2013   LDLCALC 103* 05/31/2012   ALT 22 07/05/2013   AST 23 07/05/2013   NA 139 07/05/2013   K 4.0 07/05/2013   CL 106 07/05/2013   CREATININE 0.8 07/05/2013   BUN 10 07/05/2013   CO2 25 07/05/2013  TSH 1.22 07/05/2013   HGBA1C 6.7* 01/27/2013    ASSESSMENT AND PLAN:  Discussed the following assessment and plan:  Visit for preventive health examination  Prediabetes  Need for prophylactic vaccination against Streptococcus pneumoniae (pneumococcus) - Plan: Pneumococcal conjugate vaccine 13-valent less than 5yo IM  HYPERLIPIDEMIA  HYPERTENSION  GERD  UNEQUAL LEG LENGTH  SCOLIOSIS  HYPERGLYCEMIA, FASTING  Scoliosis Counseled regarding healthy nutrition, exercise, sleep, injury prevention, calcium vit d and healthy weight . Dietary    Increase metformin   No meets criteria for dm also  At last labs  Patient Care Team: Madelin Headings, MD as PCP - General Gelene Mink as Referring Physician (Optometry) Jessica Priest, MD as Attending Physician (General Practice) Noralee Stain, MD as Consulting Physician (Dermatology) Robley Fries, MD as Consulting Physician (Obstetrics and Gynecology) Archer Asa, MD as Consulting Physician (Psychiatry) Patient Instructions  Increase  Metformin to 3 per day can take 1 3 x per day or  2 in am and 1 in pm  Please stop the sugars as you fasting blood sugar  Has risen.  YOu are now in the diabetic range   .   Intensify lifestyle interventions. To get sugars under control   Plan hg a1c and bmp  in 1-2 months and then ROV to reevaluated blood sugar control     Melessia Kaus K. Arlen Dupuis M.D.  Health Maintenance  Topic Date Due  . Pap Smear  11/13/2012  . Influenza Vaccine  05/13/2014  . Mammogram  05/19/2015  . Tetanus/tdap  04/03/2018  . Colonoscopy  02/08/2019  . Zostavax  Completed   Health Maintenance Review

## 2013-07-12 NOTE — Patient Instructions (Signed)
Increase  Metformin to 3 per day can take 1 3 x per day or  2 in am and 1 in pm  Please stop the sugars as you fasting blood sugar  Has risen.  YOu are now in the diabetic range   .   Intensify lifestyle interventions. To get sugars under control   Plan hg a1c and bmp  in 1-2 months and then ROV to reevaluated blood sugar control

## 2013-07-13 ENCOUNTER — Encounter: Payer: Self-pay | Admitting: Internal Medicine

## 2013-08-19 ENCOUNTER — Other Ambulatory Visit (INDEPENDENT_AMBULATORY_CARE_PROVIDER_SITE_OTHER): Payer: PRIVATE HEALTH INSURANCE

## 2013-08-19 DIAGNOSIS — R7309 Other abnormal glucose: Secondary | ICD-10-CM

## 2013-08-19 DIAGNOSIS — I10 Essential (primary) hypertension: Secondary | ICD-10-CM

## 2013-08-19 LAB — HEMOGLOBIN A1C: Hgb A1c MFr Bld: 7 % — ABNORMAL HIGH (ref 4.6–6.5)

## 2013-08-19 LAB — BASIC METABOLIC PANEL
BUN: 14 mg/dL (ref 6–23)
Chloride: 103 mEq/L (ref 96–112)
Glucose, Bld: 145 mg/dL — ABNORMAL HIGH (ref 70–99)
Potassium: 4.4 mEq/L (ref 3.5–5.1)
Sodium: 136 mEq/L (ref 135–145)

## 2013-08-26 ENCOUNTER — Encounter: Payer: Self-pay | Admitting: Internal Medicine

## 2013-08-26 ENCOUNTER — Ambulatory Visit (INDEPENDENT_AMBULATORY_CARE_PROVIDER_SITE_OTHER): Payer: PRIVATE HEALTH INSURANCE | Admitting: Internal Medicine

## 2013-08-26 ENCOUNTER — Encounter: Payer: Self-pay | Admitting: Family Medicine

## 2013-08-26 VITALS — BP 130/86 | Temp 98.6°F | Wt 198.0 lb

## 2013-08-26 DIAGNOSIS — T887XXA Unspecified adverse effect of drug or medicament, initial encounter: Secondary | ICD-10-CM

## 2013-08-26 DIAGNOSIS — T50905A Adverse effect of unspecified drugs, medicaments and biological substances, initial encounter: Secondary | ICD-10-CM

## 2013-08-26 DIAGNOSIS — E119 Type 2 diabetes mellitus without complications: Secondary | ICD-10-CM

## 2013-08-26 LAB — MICROALBUMIN / CREATININE URINE RATIO
Creatinine,U: 38.8 mg/dL
Microalb Creat Ratio: 0.5 mg/g (ref 0.0–30.0)

## 2013-08-26 NOTE — Progress Notes (Signed)
Chief Complaint  Patient presents with  . Follow-up    HPI:  Patient comes in for followup of elevated blood sugar prediabetes or early type 2 diabetes we had advised to increase her metformin  To 1500 mg a day however when she took 2 at night right before bed she had nausea in the morningand therefore went back down on her dose. No diarrhea or fever.   Nausea on am.   Is still doing her exercise trying to work with trainer mindfulness in her eating. No other major changes in her health.  ROS: See pertinent positives and negatives per HPI.  Past Medical History  Diagnosis Date  . Osteopenia     dexa -1.7 h -1.35   . Sleep apnea   . Scoliosis     leg length discrepancy  . Allergy   . Depression   . Osteoarthritis   . Skin cancer     basal cell nose  . History of ETOH abuse     recovering  in remission  . Hyperlipidemia   . Dry eyes     Family History  Problem Relation Age of Onset  . Arthritis Brother   . Alcohol abuse Brother   . Lymphoma Mother   . Diabetes Mother   . Hyperlipidemia Mother   . Heart failure Father   . Hyperlipidemia Father   . Alcohol abuse Brother     History   Social History  . Marital Status: Married    Spouse Name: N/A    Number of Children: N/A  . Years of Education: N/A   Social History Main Topics  . Smoking status: Never Smoker   . Smokeless tobacco: Never Used  . Alcohol Use: No     Comment: recovering  . Drug Use: No  . Sexual Activity: None   Other Topics Concern  . 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 atorvastatin (LIPITOR) 20 MG tablet TAKE 1 TABLET BY MOUTH EVERY DAY  . calcipotriene-betamethasone (TACLONEX) ointment Apply 1 application topically daily.  . calcium  gluconate 500 MG tablet Take 500 mg by mouth 2 (two) times daily.   . carvedilol (COREG) 12.5 MG tablet Take 1 tablet (12.5 mg total) by mouth 2 (two) times daily with a meal. ONE TABLET BY MOUTH TWO TIMES DAILY  . ciclesonide (OMNARIS) 50 MCG/ACT nasal spray Place 2 sprays into both nostrils daily.  . cycloSPORINE (RESTASIS) 0.05 % ophthalmic emulsion Place 1 drop into both eyes at bedtime as needed.   . DULoxetine (CYMBALTA) 30 MG capsule Take 30 mg by mouth daily. 3 tabs daily    . fish oil-omega-3 fatty acids 1000 MG capsule Take 1 g by mouth daily.  . metFORMIN (GLUCOPHAGE-XR) 500 MG 24 hr tablet Take 3 per day  . montelukast (SINGULAIR) 10 MG tablet Take 1 tablet (10 mg total) by mouth at bedtime.  . Multiple Vitamin (MULTIVITAMIN) tablet Take 1 tablet by mouth daily.    Marland Kitchen omeprazole (PRILOSEC) 40 MG capsule Take 1 capsule (40 mg total) by mouth daily.  Marland Kitchen VITAMIN D, CHOLECALCIFEROL, PO Take by mouth.  EXAM:  BP 130/86  Temp(Src) 98.6 F (37 C) (Oral)  Wt 198 lb (89.812 kg)  Body mass index is 29.66 kg/(m^2).  GENERAL: vitals reviewed and listed above, alert, oriented, appears well hydrated and in no acute distress MS: moves all extremities without noticeable focal  abnormality PSYCH: pleasant and cooperative, no obvious depression or anxiety Lab Results  Component Value Date   HGBA1C 7.0* 08/19/2013    ASSESSMENT AND PLAN:  Discussed the following assessment and plan:  Diabetes mellitus, new onset - Plan: Microalbumin / creatinine urine ratio  Medication side effect, initial encounter - A.m. nausea with late night metformin discussed strategies to try before other medication  -Patient advised to return or notify health care team  if symptoms worsen or persist or new concerns arise.  Patient Instructions  Try taking the metformin  With food split 3 x per day or evening dose with dinner .     Contact us  In abvout 3-4 weeks  A bout how you are tolerating this  regimen Continue lifestyle intervention healthy eating and exercise .  ROV in 4 months  Hg a1c pre visit    Neta Mends. Jenette Rayson M.D.

## 2013-08-26 NOTE — Patient Instructions (Addendum)
Try taking the metformin  With food split 3 x per day or evening dose with dinner .     Contact us  In abvout 3-4 weeks  A bout how you are tolerating this regimen Continue lifestyle intervention healthy eating and exercise .  ROV in 4 months  Hg a1c pre visit

## 2013-10-13 HISTORY — PX: VENOUS ABLATION: SHX2656

## 2013-10-13 HISTORY — PX: DENTAL SURGERY: SHX609

## 2013-11-09 ENCOUNTER — Telehealth: Payer: Self-pay | Admitting: Pulmonary Disease

## 2013-11-09 NOTE — Telephone Encounter (Signed)
I called [facility] and [facility]-spoke with [facility]. Advised her we have not seen this patient in the office. She will let the nurse know. Nothing further needed

## 2013-12-07 ENCOUNTER — Encounter: Payer: Self-pay | Admitting: Pulmonary Disease

## 2013-12-07 ENCOUNTER — Ambulatory Visit (INDEPENDENT_AMBULATORY_CARE_PROVIDER_SITE_OTHER): Payer: PRIVATE HEALTH INSURANCE | Admitting: Pulmonary Disease

## 2013-12-07 VITALS — BP 144/82 | HR 70 | Temp 97.7°F | Ht 69.0 in | Wt 197.8 lb

## 2013-12-07 DIAGNOSIS — G4733 Obstructive sleep apnea (adult) (pediatric): Secondary | ICD-10-CM

## 2013-12-07 NOTE — Assessment & Plan Note (Signed)
She has done well with oral appliance and needs to have this refitted.  She was previously seen by Dr. Britt Bottom in her [facility] location.  Dr. Roderic Ovens has since relocated to [facility].  Will arrange for her to follow up with Dr. Roderic Ovens to get her oral appliance refitted.  Will also have script sent to her DME.  Advised that in the future she could check with her PCP to have sleep apnea therapy renewed, and could then only follow up with pulmonary/sleep as needed if she has trouble.

## 2013-12-07 NOTE — Progress Notes (Signed)
Chief Complaint  Patient presents with  . Advice Only    Re-establish care, was last seen in 2010.  Pt needs a new dental appliance for sleep apnea.      History of Present Illness: Holly Beasley is a 63 y.o. female with severe OSA using oral appliance.  She was last seen March 26, 2009.  She was intolerant of CPAP and was using an oral appliance.  This was done by Dr. Britt Bottom in her [facility] location.  Dr. Roderic Ovens relocated, and [facility] staff would not give Holly Beasley forwarding address for Dr. Roderic Ovens.  As a result she has not seen Dr. Roderic Ovens since 2012.  April 26 2009 last sleep study with oral appliance which showed significant improvement in her her sleep apnea.  She has been very pleased with oral appliance.  Her sleep time varies since she retired, but she gets about 6 to 8 hours sleep.  She will sometimes take a nap in the afternoon.  She does not use her mouth piece when she naps.  She denies snoring.  She gets occasional click her jaw, but denies pain with chewing.  TESTS: PSG 03/07/08 >> AHI 38  [name]  has a past medical history of Osteopenia; Sleep apnea; Scoliosis; Allergy; Depression; Osteoarthritis; Skin cancer; History of ETOH abuse; Hyperlipidemia; and Dry eyes.  [name]  has past surgical history that includes Colonoscopy w/ polypectomy; Tonsillectomy and adenoidectomy (1967); Nasal sinus surgery (11-13-2008); Breast biopsy; and skin cancer removal.  Prior to Admission medications   Medication Sig Start Date End Date Taking? Authorizing Provider  atorvastatin (LIPITOR) 20 MG tablet TAKE 1 TABLET BY MOUTH EVERY DAY 06/07/13  Yes Burnis Medin, MD  calcipotriene-betamethasone (TACLONEX) ointment Apply 1 application topically daily.   Yes Selinda Orion, MD  calcium gluconate 500 MG tablet Take 500 mg by mouth 2 (two) times daily.    Yes Historical Provider, MD  carvedilol (COREG) 12.5 MG tablet Take 1 tablet (12.5 mg total) by mouth 2 (two) times  daily with a meal. ONE TABLET BY MOUTH TWO TIMES DAILY 01/31/13  Yes Burnis Medin, MD  ciclesonide (OMNARIS) 50 MCG/ACT nasal spray Place 2 sprays into both nostrils daily.   Yes Historical Provider, MD  cycloSPORINE (RESTASIS) 0.05 % ophthalmic emulsion Place 1 drop into both eyes at bedtime as needed.    Yes Camillo Flaming, OD  DULoxetine (CYMBALTA) 30 MG capsule Take 30 mg by mouth 3 (three) times daily.    Yes Historical Provider, MD  fish oil-omega-3 fatty acids 1000 MG capsule Take 1 g by mouth daily.   Yes Historical Provider, MD  metFORMIN (GLUCOPHAGE-XR) 500 MG 24 hr tablet Take 3 per day 07/12/13  Yes Burnis Medin, MD  montelukast (SINGULAIR) 10 MG tablet Take 1 tablet (10 mg total) by mouth at bedtime. 07/12/13  Yes Burnis Medin, MD  Multiple Vitamin (MULTIVITAMIN) tablet Take 1 tablet by mouth daily.     Yes Historical Provider, MD  omeprazole (PRILOSEC) 40 MG capsule Take 1 capsule (40 mg total) by mouth daily. 07/12/13  Yes Burnis Medin, MD  VITAMIN D, CHOLECALCIFEROL, PO Take by mouth.     Yes Historical Provider, MD    Allergies  Allergen Reactions  . Amoxicillin     REACTION: unspecified  . Cephalexin     REACTION: unspecified  . Sulfamethoxazole     REACTION: unspecified     Physical Exam:  General - No  distress ENT - No sinus tenderness, no oral exudate, no LAN Cardiac - s1s2 regular, no murmur Chest - No wheeze/rales/dullness Back - No focal tenderness Abd - Soft, non-tender Ext - No edema Neuro - Normal strength Skin - No rashes Psych - normal mood, and behavior   Assessment/Plan:  Chesley Mires, MD [place] [facility]/Sleep Pager:  [phone]

## 2013-12-07 NOTE — Patient Instructions (Signed)
Will arrange for referral back to Dr. Britt Bottom with [facility], [address], [place], [place], [address], phone number [phone] Call if you need further assistance Follow up with pulmonary as needed

## 2013-12-19 ENCOUNTER — Other Ambulatory Visit (INDEPENDENT_AMBULATORY_CARE_PROVIDER_SITE_OTHER): Payer: PRIVATE HEALTH INSURANCE

## 2013-12-19 DIAGNOSIS — R7309 Other abnormal glucose: Secondary | ICD-10-CM

## 2013-12-19 LAB — HEMOGLOBIN A1C: Hgb A1c MFr Bld: 6.8 % — ABNORMAL HIGH (ref 4.6–6.5)

## 2013-12-26 ENCOUNTER — Encounter: Payer: Self-pay | Admitting: Internal Medicine

## 2013-12-26 ENCOUNTER — Ambulatory Visit (INDEPENDENT_AMBULATORY_CARE_PROVIDER_SITE_OTHER): Payer: PRIVATE HEALTH INSURANCE | Admitting: Internal Medicine

## 2013-12-26 VITALS — BP 139/80 | Temp 98.0°F | Ht 68.5 in | Wt 193.0 lb

## 2013-12-26 DIAGNOSIS — Z7189 Other specified counseling: Secondary | ICD-10-CM

## 2013-12-26 DIAGNOSIS — R7309 Other abnormal glucose: Secondary | ICD-10-CM

## 2013-12-26 DIAGNOSIS — G4733 Obstructive sleep apnea (adult) (pediatric): Secondary | ICD-10-CM

## 2013-12-26 DIAGNOSIS — R7303 Prediabetes: Secondary | ICD-10-CM

## 2013-12-26 DIAGNOSIS — E785 Hyperlipidemia, unspecified: Secondary | ICD-10-CM

## 2013-12-26 DIAGNOSIS — E119 Type 2 diabetes mellitus without complications: Secondary | ICD-10-CM

## 2013-12-26 DIAGNOSIS — Z7184 Encounter for health counseling related to travel: Secondary | ICD-10-CM

## 2013-12-26 MED ORDER — CIPROFLOXACIN HCL 500 MG PO TABS: 500.0000 mg | ORAL_TABLET | Freq: Two times a day (BID) | ORAL | Status: DC

## 2013-12-26 MED ORDER — ATORVASTATIN CALCIUM 40 MG PO TABS: 40.0000 mg | ORAL_TABLET | Freq: Every day | ORAL | Status: DC

## 2013-12-26 MED ORDER — SCOPOLAMINE 1 MG/3DAYS TD PT72: 1.0000 | MEDICATED_PATCH | TRANSDERMAL | Status: DC

## 2013-12-26 NOTE — Progress Notes (Signed)
Chief Complaint  Patient presents with  . Follow-up    HPI: Patient comes in today for follow up of  multiple medical . Fu blood sugars  a1c eval   Mostly elevated bg "dm";taking  Med with meals and helping.  A lot better wiethout side effect/  No new med ; no low bg   Taking Lipitor 20 for a while no se of med   To travel to [place].  Asks about  Sea sik patch( Trans derm scpoilomine. Marland Kitchen)  And antidiarrhea med .     osa sees dr Halford Chessman  Who rx oral appliance and is on a prn fu  Advised have pcp fu  Renewal if needed   ROS: See pertinent positives and negatives per HPI. No cv pulm sx today vision change numnbess  Past Medical History  Diagnosis Date  . Osteopenia     dexa -1.7 h -1.35   . Sleep apnea   . Scoliosis     leg length discrepancy  . Allergy   . Depression   . Osteoarthritis   . Skin cancer     basal cell nose  . History of ETOH abuse     recovering  in remission  . Hyperlipidemia   . Dry eyes     Family History  Problem Relation Age of Onset  . Arthritis Brother   . Alcohol abuse Brother   . Lymphoma Mother   . Diabetes Mother   . Hyperlipidemia Mother   . Heart failure Father   . Hyperlipidemia Father   . Alcohol abuse Brother     History   Social History  . Marital Status: Married    Spouse Name: N/A    Number of Children: N/A  . Years of Education: N/A   Social History Main Topics  . Smoking status: Never Smoker   . Smokeless tobacco: Never Used  . Alcohol Use: No     Comment: recovering  . Drug Use: No  . Sexual Activity: None   Other Topics Concern  . 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 calcipotriene-betamethasone (TACLONEX) ointment Apply 1 application topically daily.    . calcium gluconate 500 MG tablet Take 500 mg by mouth 2 (two) times daily.   . carvedilol (COREG) 12.5 MG tablet Take 1 tablet (12.5 mg total) by mouth 2 (two) times daily with a meal. ONE TABLET BY MOUTH TWO TIMES DAILY  . ciclesonide (OMNARIS) 50 MCG/ACT nasal spray Place 2 sprays into both nostrils daily.  . cycloSPORINE (RESTASIS) 0.05 % ophthalmic emulsion Place 1 drop into both eyes at bedtime as needed.   . DULoxetine (CYMBALTA) 30 MG capsule Take 90 mg by mouth daily.   . fish oil-omega-3 fatty acids 1000 MG capsule Take 1 g by mouth daily.  . metFORMIN (GLUCOPHAGE-XR) 500 MG 24 hr tablet Take 3 per day  . montelukast (SINGULAIR) 10 MG tablet Take 1 tablet (10 mg total) by mouth at bedtime.  . Multiple Vitamin (MULTIVITAMIN) tablet Take 1 tablet by mouth daily.    Marland Kitchen omeprazole (PRILOSEC) 40 MG capsule Take 1 capsule (40 mg  total) by mouth daily.  Marland Kitchen VITAMIN D, CHOLECALCIFEROL, PO Take by mouth.    . [DISCONTINUED] atorvastatin (LIPITOR) 20 MG tablet TAKE 1 TABLET BY MOUTH EVERY DAY  . atorvastatin (LIPITOR) 40 MG tablet Take 1 tablet (40 mg total) by mouth daily.  . ciprofloxacin (CIPRO) 500 MG tablet Take 1 tablet (500 mg total) by mouth 2 (two) times daily. fo traveler diarrhea  . HYDROcodone-acetaminophen (NORCO) 7.5-325 MG per tablet   . scopolamine (TRANSDERM-SCOP) 1.5 MG Place 1 patch (1.5 mg total) onto the skin every 3 (three) days.    EXAM:  BP 139/80  Temp(Src) 98 F (36.7 C) (Oral)  Ht 5' 8.5" (1.74 m)  Wt 193 lb (87.544 kg)  BMI 28.92 kg/m2  Body mass index is 28.92 kg/(m^2).  GENERAL: vitals reviewed and listed above, alert, oriented, appears well hydrated and in no acute distress HEENT: atraumatic, conjunctiva  clear, no obvious abnormalities on inspection of external nose and ears MS: moves all extremities without noticeable focal  abnormality PSYCH: pleasant and cooperative, no obvious depression or anxiety Lab Results  Component Value Date   WBC 6.1  07/05/2013   HGB 13.2 07/05/2013   HCT 38.6 07/05/2013   PLT 249.0 07/05/2013   GLUCOSE 145* 08/19/2013   CHOL 207* 07/05/2013   TRIG 108.0 07/05/2013   HDL 52.60 07/05/2013   LDLDIRECT 131.0 07/05/2013   LDLCALC 103* 05/31/2012   ALT 22 07/05/2013   AST 23 07/05/2013   NA 136 08/19/2013   K 4.4 08/19/2013   CL 103 08/19/2013   CREATININE 0.8 08/19/2013   BUN 14 08/19/2013   CO2 27 08/19/2013   TSH 1.22 07/05/2013   HGBA1C 6.8* 12/19/2013   MICROALBUR 0.2 08/26/2013    ASSESSMENT AND PLAN:  Discussed the following assessment and plan:  Prediabetes  Diabetes mellitus, new onset - better labs continue  Other and unspecified hyperlipidemia - intensify statin med if tolerated  inc to 40 mg  and recheck 4 months   Counseling about travel - rx trasnderm and cipro if needed with precautions . no CI to  MEDs   Obstructive sleep apnea (adult) (pediatric) - rx oral applinace on prn fu iwth sleep   2015   -Patient advised to return or notify health care team  if symptoms worsen ,persist or new concerns arise.  Patient Instructions  Increase  The lipitor to 40 mg per day  For better results.  Continue lifestyle intervention healthy eating and exercise . sugars are better.   Check lipid panel and hg a1c in 4 months  Ok to  Use scpolomine. Patches .    Standley Brooking. Panosh M.D.  Pre visit review using our clinic review tool, if applicable. No additional management support is needed unless otherwise documented below in the visit note.

## 2013-12-26 NOTE — Patient Instructions (Signed)
Increase  The lipitor to 40 mg per day  For better results.  Continue lifestyle intervention healthy eating and exercise . sugars are better.   Check lipid panel and hg a1c in 4 months  Ok to  Use scpolomine. Patches .

## 2014-01-26 ENCOUNTER — Other Ambulatory Visit: Payer: Self-pay | Admitting: Internal Medicine

## 2014-01-28 ENCOUNTER — Other Ambulatory Visit: Payer: Self-pay | Admitting: Internal Medicine

## 2014-04-18 ENCOUNTER — Other Ambulatory Visit (INDEPENDENT_AMBULATORY_CARE_PROVIDER_SITE_OTHER): Payer: PRIVATE HEALTH INSURANCE

## 2014-04-18 DIAGNOSIS — R7301 Impaired fasting glucose: Secondary | ICD-10-CM

## 2014-04-18 DIAGNOSIS — E785 Hyperlipidemia, unspecified: Secondary | ICD-10-CM

## 2014-04-18 LAB — LIPID PANEL
CHOLESTEROL: 167 mg/dL (ref 0–200)
HDL: 46.6 mg/dL (ref >39.00)
LDL Cholesterol: 91 mg/dL (ref 0–99)
NonHDL: 120.4
Total CHOL/HDL Ratio: 4
Triglycerides: 145 mg/dL (ref 0.0–149.0)
VLDL: 29 mg/dL (ref 0.0–40.0)

## 2014-04-18 LAB — HEMOGLOBIN A1C: Hgb A1c MFr Bld: 7.1 % — ABNORMAL HIGH (ref 4.6–6.5)

## 2014-04-25 ENCOUNTER — Encounter: Payer: Self-pay | Admitting: Internal Medicine

## 2014-04-25 ENCOUNTER — Ambulatory Visit (INDEPENDENT_AMBULATORY_CARE_PROVIDER_SITE_OTHER): Payer: PRIVATE HEALTH INSURANCE | Admitting: Internal Medicine

## 2014-04-25 VITALS — BP 148/78 | Temp 98.1°F | Ht 68.5 in | Wt 197.0 lb

## 2014-04-25 DIAGNOSIS — E119 Type 2 diabetes mellitus without complications: Secondary | ICD-10-CM

## 2014-04-25 DIAGNOSIS — M7989 Other specified soft tissue disorders: Secondary | ICD-10-CM

## 2014-04-25 DIAGNOSIS — I10 Essential (primary) hypertension: Secondary | ICD-10-CM

## 2014-04-25 DIAGNOSIS — E785 Hyperlipidemia, unspecified: Secondary | ICD-10-CM

## 2014-04-25 MED ORDER — METFORMIN HCL ER 500 MG PO TB24: ORAL_TABLET | ORAL | Status: DC

## 2014-04-25 NOTE — Patient Instructions (Addendum)
Will arrange   Ultrasound  Left leg to make sure not a clot or obstruction.  If ok plan compression stockings  Support and elevation as possile until subsides Intensify lifestyle interventions. And increase metformin to 4 500 mg per day. ( 2000mg ) per day as tolerated . Plan wellness visit and labs in November  cpx labs and hgba1c. Pre visit.  BP Readings from Last 3 Encounters:  04/25/14 156/80  12/26/13 139/80  12/07/13 144/82  Take blood pressure readings twice a day for 7- 10 days and then periodically .To ensure below 140/90   .get back if still elevated. Repeat bp was   148/78    Contact us with any concerns .

## 2014-04-25 NOTE — Progress Notes (Signed)
Pre visit review using our clinic review tool, if applicable. No additional management support is needed unless otherwise documented below in the visit note.  Chief Complaint  Patient presents with  . Follow-up    Has right foot/leg swelling.  Ongoing for several weeks.  Has pain on the inside of her calf.  Left knee is also swelling.    HPI: Holly Beasley is a 63 year old married female comes in today for followup of a number of issues.  She's begun on Lipitor 40 mg last time with intensification and followup labs no side effects noted  She's also here for followup of her blood sugar monitoring prediabetes or early diabetes on 1500 mg of metformin a day. No vision changes numbness. Still likes her sweets  Hasn't checked her blood pressure recently but had been good.  New problemHistorically left leg swelling Arabella Merles goes away but now  swelling has remained Up for several weeks left leg Not that tender no trauma but  New exercise  Foam rolling  And knee sore .   Less cardio recently   About the same.  Sugar is downfall ROS: See pertinent positives and negatives per HPI. No current chest pain shortness of breath syncope new cough fever recent sinus infection. Has had a dental implant and was put on pain medicine for that.  Past Medical History  Diagnosis Date  . Osteopenia     dexa -1.7 h -1.35   . Sleep apnea   . Scoliosis     leg length discrepancy  . Allergy   . Depression   . Osteoarthritis   . Skin cancer     basal cell nose  . History of ETOH abuse     recovering  in remission  . Hyperlipidemia   . Dry eyes     Family History  Problem Relation Age of Onset  . Arthritis Brother   . Alcohol abuse Brother   . Lymphoma Mother   . Diabetes Mother   . Hyperlipidemia Mother   . Heart failure Father   . Hyperlipidemia Father   . Alcohol abuse Brother     History   Social History  . Marital Status: Married    Spouse Name: N/A    Number of Children: N/A  .  Years of Education: N/A   Social History Main Topics  . Smoking status: Never Smoker   . Smokeless tobacco: Never Used  . Alcohol Use: No     Comment: recovering  . Drug Use: No  . Sexual Activity: None   Other Topics Concern  . 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 atorvastatin (LIPITOR) 40 MG tablet Take 1 tablet (40 mg total) by mouth daily.  . calcipotriene-betamethasone (TACLONEX) ointment Apply 1 application topically daily.  . calcium gluconate 500 MG tablet Take 500 mg by mouth 2 (two) times daily.   . carvedilol (COREG) 12.5 MG tablet TAKE 1 TABLET BY MOUTH TWICE A DAY WITH A MEAL  . ciclesonide (OMNARIS) 50 MCG/ACT nasal spray Place 2 sprays into both nostrils daily.  . cycloSPORINE (RESTASIS) 0.05 % ophthalmic emulsion Place 1 drop  into both eyes at bedtime as needed.   . DULoxetine (CYMBALTA) 30 MG capsule Take 90 mg by mouth daily.   . fish oil-omega-3 fatty acids 1000 MG capsule Take 1 g by mouth daily.  Marland Kitchen HYDROcodone-acetaminophen (NORCO) 7.5-325 MG per tablet   . metFORMIN (GLUCOPHAGE-XR) 500 MG 24 hr tablet Take 4 per day  . montelukast (SINGULAIR) 10 MG tablet Take 1 tablet (10 mg total) by mouth at bedtime.  . Multiple Vitamin (MULTIVITAMIN) tablet Take 1 tablet by mouth daily.    Marland Kitchen omeprazole (PRILOSEC) 40 MG capsule Take 1 capsule (40 mg total) by mouth daily.  Marland Kitchen VITAMIN D, CHOLECALCIFEROL, PO Take by mouth.    . [DISCONTINUED] metFORMIN (GLUCOPHAGE-XR) 500 MG 24 hr tablet Take 3 per day  . [DISCONTINUED] ciprofloxacin (CIPRO) 500 MG tablet Take 1 tablet (500 mg total) by mouth 2 (two) times daily. fo traveler diarrhea  . [DISCONTINUED] scopolamine (TRANSDERM-SCOP) 1.5 MG Place 1 patch (1.5 mg total) onto the  skin every 3 (three) days.    EXAM:  BP 148/78  Temp(Src) 98.1 F (36.7 C) (Oral)  Ht 5' 8.5" (1.74 m)  Wt 197 lb (89.359 kg)  BMI 29.51 kg/m2  Body mass index is 29.51 kg/(m^2).  GENERAL: vitals reviewed and listed above, alert, oriented, appears well hydrated and in no acute distress HEENT: atraumatic, conjunctiva  clear, no obvious abnormalities on inspection of external nose and ears NECK: no obvious masses on inspection palpation  LUNGS: clear to auscultation bilaterally, no wheezes, rales or rhonchi, good air movement CV: HRRR, no clubbing cyanosis  nl cap refill  MS: moves all extremities left leg 2+ ? lympjhedema vs other edema no rednse  dermatoitos on  andkle foot nl cap refill  PSYCH: pleasant and cooperative, no obvious depression or anxiety Lab Results  Component Value Date   WBC 6.1 07/05/2013   HGB 13.2 07/05/2013   HCT 38.6 07/05/2013   PLT 249.0 07/05/2013   GLUCOSE 145* 08/19/2013   CHOL 167 04/18/2014   TRIG 145.0 04/18/2014   HDL 46.60 04/18/2014   LDLDIRECT 131.0 07/05/2013   LDLCALC 91 04/18/2014   ALT 22 07/05/2013   AST 23 07/05/2013   NA 136 08/19/2013   K 4.4 08/19/2013   CL 103 08/19/2013   CREATININE 0.8 08/19/2013   BUN 14 08/19/2013   CO2 27 08/19/2013   TSH 1.22 07/05/2013   HGBA1C 7.1* 04/18/2014   MICROALBUR 0.2 08/26/2013    ASSESSMENT AND PLAN:  Discussed the following assessment and plan:  Other and unspecified hyperlipidemia - better on higher dose  Diabetes mellitus type 2, controlled, without complications - deterioration of a1c  instensify ls inc metformin   Left leg swelling - hx of same but persisten for a month check doppler could be lymphedema but change  - Plan: Lower Extremity Venous Duplex Left  Unspecified essential hypertension - up today  checka t home  to ensure control  -Patient advised to return or notify health care team  if symptoms worsen ,persist or new concerns arise.  Patient Instructions   Will arrange   Ultrasound  Left  leg to make sure not a clot or obstruction.  If ok plan compression stockings  Support and elevation as possile until subsides Intensify lifestyle interventions. And increase metformin to 4 500 mg per day. ( 2000mg ) per day as tolerated . Plan wellness visit and labs in November  cpx labs and hgba1c. Pre visit.  BP Readings from Last 3 Encounters:  04/25/14  156/80  12/26/13 139/80  12/07/13 144/82  Take blood pressure readings twice a day for 7- 10 days and then periodically .To ensure below 140/90   .get back if still elevated. Repeat bp was   148/78    Contact us with any concerns .       Standley Brooking. Locklan Canoy M.D.

## 2014-04-26 ENCOUNTER — Telehealth: Payer: Self-pay | Admitting: Internal Medicine

## 2014-04-26 NOTE — Telephone Encounter (Signed)
Relevant patient education assigned to patient using Emmi. ° °

## 2014-04-27 ENCOUNTER — Other Ambulatory Visit (HOSPITAL_COMMUNITY): Payer: Self-pay | Admitting: Psychiatry

## 2014-04-27 ENCOUNTER — Encounter (HOSPITAL_COMMUNITY): Payer: PRIVATE HEALTH INSURANCE

## 2014-04-28 ENCOUNTER — Ambulatory Visit (HOSPITAL_COMMUNITY): Payer: 59 | Attending: Internal Medicine | Admitting: *Deleted

## 2014-04-28 ENCOUNTER — Encounter (HOSPITAL_COMMUNITY): Payer: PRIVATE HEALTH INSURANCE

## 2014-04-28 DIAGNOSIS — M7989 Other specified soft tissue disorders: Secondary | ICD-10-CM | POA: Insufficient documentation

## 2014-04-28 DIAGNOSIS — I10 Essential (primary) hypertension: Secondary | ICD-10-CM | POA: Insufficient documentation

## 2014-04-28 DIAGNOSIS — Z87891 Personal history of nicotine dependence: Secondary | ICD-10-CM | POA: Insufficient documentation

## 2014-04-28 NOTE — Progress Notes (Signed)
Lower extremity venous duplex left complete

## 2014-05-02 ENCOUNTER — Telehealth: Payer: Self-pay | Admitting: Internal Medicine

## 2014-05-02 NOTE — Telephone Encounter (Signed)
Patient Information:  Caller Name: Taleya  Phone: [phone]  Patient: Holly Beasley, Holly Beasley  Gender: Female  DOB: [birth year]  Age: 63 Years  PCP: Shanon Ace (Family Practice)  Office Follow Up:  Does the office need to follow up with this patient?: No  Instructions For The Office: N/A   Symptoms  Reason For Call & Symptoms: started having swelling in the left leg 3wks ago; seen by Dr.Panosh 04/25/14; had [place] 7/17, but showed no DVT; says there was something going on with the lymph nodes; had some discomfort in calf, but only showed a varicose vein; Skyah is concerned because her mother had similar sxs and ended up with lymphoma; would like to f/u for additional treatment  Reviewed Health History In EMR: Yes  Reviewed Medications In EMR: Yes  Reviewed Allergies In EMR: Yes  Reviewed Surgeries / Procedures: Yes  Date of Onset of Symptoms: Unknown  Guideline(s) Used:  Leg Swelling and Edema  Disposition Per Guideline:   See Today in Office  Reason For Disposition Reached:   Patient wants to be seen  Advice Given:  N/A  Patient Will Follow Care Advice:  YES  Appointment Scheduled:  05/03/2014 11:30:00 Appointment Scheduled Provider:  Shanon Ace (Family Practice)

## 2014-05-02 NOTE — Telephone Encounter (Signed)
Noted  

## 2014-05-03 ENCOUNTER — Encounter: Payer: Self-pay | Admitting: Internal Medicine

## 2014-05-03 ENCOUNTER — Ambulatory Visit (INDEPENDENT_AMBULATORY_CARE_PROVIDER_SITE_OTHER): Payer: PRIVATE HEALTH INSURANCE | Admitting: Internal Medicine

## 2014-05-03 VITALS — BP 142/86 | HR 76 | Temp 99.2°F | Ht 68.5 in | Wt 194.0 lb

## 2014-05-03 DIAGNOSIS — M7989 Other specified soft tissue disorders: Secondary | ICD-10-CM

## 2014-05-03 DIAGNOSIS — I8390 Asymptomatic varicose veins of unspecified lower extremity: Secondary | ICD-10-CM

## 2014-05-03 DIAGNOSIS — R590 Localized enlarged lymph nodes: Secondary | ICD-10-CM | POA: Insufficient documentation

## 2014-05-03 DIAGNOSIS — R599 Enlarged lymph nodes, unspecified: Secondary | ICD-10-CM

## 2014-05-03 LAB — CBC WITH DIFFERENTIAL/PLATELET
BASOS PCT: 0.3 % (ref 0.0–3.0)
Basophils Absolute: 0 10*3/uL (ref 0.0–0.1)
Eosinophils Absolute: 0.3 10*3/uL (ref 0.0–0.7)
Eosinophils Relative: 4.2 % (ref 0.0–5.0)
HCT: 37.6 % (ref 36.0–46.0)
HEMOGLOBIN: 12.8 g/dL (ref 12.0–15.0)
LYMPHS PCT: 33.6 % (ref 12.0–46.0)
Lymphs Abs: 2.3 10*3/uL (ref 0.7–4.0)
MCHC: 33.9 g/dL (ref 30.0–36.0)
MCV: 92.8 fl (ref 78.0–100.0)
Monocytes Absolute: 0.5 10*3/uL (ref 0.1–1.0)
Monocytes Relative: 6.7 % (ref 3.0–12.0)
NEUTROS ABS: 3.9 10*3/uL (ref 1.4–7.7)
Neutrophils Relative %: 55.2 % (ref 43.0–77.0)
Platelets: 309 10*3/uL (ref 150.0–400.0)
RBC: 4.06 Mil/uL (ref 3.87–5.11)
RDW: 13 % (ref 11.5–15.5)
WBC: 7 10*3/uL (ref 4.0–10.5)

## 2014-05-03 LAB — POCT URINALYSIS DIP (MANUAL ENTRY)
BILIRUBIN UA: NEGATIVE
Bilirubin, UA: NEGATIVE
Leukocytes, UA: NEGATIVE
Nitrite, UA: NEGATIVE
Protein Ur, POC: NEGATIVE
Urobilinogen, UA: 0.2
pH, UA: 6

## 2014-05-03 LAB — BASIC METABOLIC PANEL
BUN: 9 mg/dL (ref 6–23)
CO2: 27 meq/L (ref 19–32)
Calcium: 9 mg/dL (ref 8.4–10.5)
Chloride: 103 mEq/L (ref 96–112)
Creatinine, Ser: 0.8 mg/dL (ref 0.4–1.2)
GFR: 82.91 mL/min (ref >60.00)
GLUCOSE: 251 mg/dL — AB (ref 70–99)
Potassium: 4 mEq/L (ref 3.5–5.1)
SODIUM: 137 meq/L (ref 135–145)

## 2014-05-03 NOTE — Progress Notes (Signed)
Pre visit review using our clinic review tool, if applicable. No additional management support is needed unless otherwise documented below in the visit note. 

## 2014-05-03 NOTE — Patient Instructions (Signed)
The lymph nodes in your groin are possibly reactive lymph nodes however because of the left leg swelling we should do a abdominal pelvic CT scan to check for any swollen glands in the abdomen or any reason for the left leg swelling that is persistent. Laboratory tests today to include a CBC chemistry and analysis.  If all the tests and the scan are normal we will just follow your clinical exam. Something gets worse we will followup sooner than routine.

## 2014-05-03 NOTE — Progress Notes (Signed)
Chief Complaint  Patient presents with  . Leg Swelling    left, chronic    HPI: Holly Beasley comes in today as requested for followup evaluation of her left lower leg swelling. Ultrasound Doppler of her left lower extremity showed no obstruction but incidental finding of some prominent lymph nodes in her left inguinal area. The report is not defined hold large they are so she comes in today.  No fever is getting dental implant no noted rash on her left leg or sweats weight loss or other adenopathy. Has some anxiety about this because her mother had a non-Hodgkin's lymphoma that presented with leg swelling. No new symptoms systemic. Further history of the leg swelling  problem was many years ago but this time the swelling did not go down   ROS: See pertinent positives and negatives per HPI. No new swelling swelling hasn't gone down.  Past Medical History  Diagnosis Date  . Osteopenia     dexa -1.7 h -1.35   . Sleep apnea   . Scoliosis     leg length discrepancy  . Allergy   . Depression   . Osteoarthritis   . Skin cancer     basal cell nose  . History of ETOH abuse     recovering  in remission  . Hyperlipidemia   . Dry eyes     Family History  Problem Relation Age of Onset  . Arthritis Brother   . Alcohol abuse Brother   . Lymphoma Mother   . Diabetes Mother   . Hyperlipidemia Mother   . Heart failure Father   . Hyperlipidemia Father   . Alcohol abuse Brother     History   Social History  . Marital Status: Married    Spouse Name: N/A    Number of Children: N/A  . Years of Education: N/A   Social History Main Topics  . Smoking status: Never Smoker   . Smokeless tobacco: Never Used  . Alcohol Use: No     Comment: recovering  . Drug Use: No  . Sexual Activity: None   Other Topics Concern  . 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 atorvastatin (LIPITOR) 40 MG tablet Take 1 tablet (40 mg total) by mouth daily.  . calcipotriene-betamethasone (TACLONEX) ointment Apply 1 application topically daily.  . calcium gluconate 500 MG tablet Take 500 mg by mouth 2 (two) times daily.   . carvedilol (COREG) 12.5 MG tablet TAKE 1 TABLET BY MOUTH TWICE A DAY WITH A MEAL  . ciclesonide (OMNARIS) 50 MCG/ACT nasal spray Place 2 sprays into both nostrils daily.  . cycloSPORINE (RESTASIS) 0.05 % ophthalmic emulsion Place 1 drop into both eyes at bedtime as needed.   . DULoxetine (CYMBALTA) 30 MG capsule Take 90 mg by mouth daily.   . fish oil-omega-3 fatty acids 1000 MG capsule Take 1 g by mouth daily.  . metFORMIN (GLUCOPHAGE-XR) 500 MG 24 hr tablet Take 4 per day  . montelukast (SINGULAIR) 10 MG tablet Take 1 tablet (10 mg total) by mouth at bedtime.  . Multiple Vitamin (MULTIVITAMIN) tablet Take  1 tablet by mouth daily.    Marland Kitchen omeprazole (PRILOSEC) 40 MG capsule Take 1 capsule (40 mg total) by mouth daily.  Marland Kitchen VITAMIN D, CHOLECALCIFEROL, PO Take by mouth.    Marland Kitchen HYDROcodone-acetaminophen (NORCO) 7.5-325 MG per tablet     EXAM:  BP 142/86  Pulse 76  Temp(Src) 99.2 F (37.3 C) (Oral)  Ht 5' 8.5" (1.74 m)  Wt 194 lb (87.998 kg)  BMI 29.07 kg/m2  Body mass index is 29.07 kg/(m^2).  GENERAL: vitals reviewed and listed above, alert, oriented, appears well hydrated and in no acute distress HEENT: atraumatic, conjunctiva  clear, no obvious abnormalities on inspection of external nose and ears NECK: no obvious masses on inspection palpation  CV: HRRR, no clubbing cyanosis or  peripheral edema nl cap refill  Abdomen soft without obvious organomegaly I don't feel an obvious mass both inguinal areas on deep palpation 1-1.5 to avoid lymph nodes no obvious hernia no pain or tenderness left lower extremity larger  than the right no acute skin findings or tense edema or couple areas of varicose veins but no ulcerations. MS: moves all extremities without noticeable focal  abnormality no bruising or bleeding or petechiae PSYCH: pleasant and cooperative, no obvious depression or anxiety Lab Results  Component Value Date   WBC 6.1 07/05/2013   HGB 13.2 07/05/2013   HCT 38.6 07/05/2013   PLT 249.0 07/05/2013   GLUCOSE 145* 08/19/2013   CHOL 167 04/18/2014   TRIG 145.0 04/18/2014   HDL 46.60 04/18/2014   LDLDIRECT 131.0 07/05/2013   LDLCALC 91 04/18/2014   ALT 22 07/05/2013   AST 23 07/05/2013   NA 136 08/19/2013   K 4.4 08/19/2013   CL 103 08/19/2013   CREATININE 0.8 08/19/2013   BUN 14 08/19/2013   CO2 27 08/19/2013   TSH 1.22 07/05/2013   HGBA1C 7.1* 04/18/2014   MICROALBUR 0.2 08/26/2013    ASSESSMENT AND PLAN:  Discussed the following assessment and plan:  Left leg swelling - Plan: Basic metabolic panel, POCT urinalysis dipstick, CBC with Differential, CT Abdomen Pelvis W Contrast  Inguinal adenopathy by ultrasound doppler - bilateral on exam? 1- 1.5 cm deep non tender no obv abd masses - Plan: Basic metabolic panel, POCT urinalysis dipstick, CBC with Differential, CT Abdomen Pelvis W Contrast  Varicose veins Reviewed with patient further workup. May have to hold the metformin the day of and the day after the procedure with IV contrast. Plan followup depending on results.-Patient advised to return or notify health care team  if symptoms worsen ,persist or new concerns arise.  Patient Instructions  The lymph nodes in your groin are possibly reactive lymph nodes however because of the left leg swelling we should do a abdominal pelvic CT scan to check for any swollen glands in the abdomen or any reason for the left leg swelling that is persistent. Laboratory tests today to include a CBC chemistry and analysis.  If all the tests and the scan are normal we will just follow your clinical exam. Something gets worse  we will followup sooner than routine.  Standley Brooking. Panosh M.D.

## 2014-05-04 ENCOUNTER — Ambulatory Visit (INDEPENDENT_AMBULATORY_CARE_PROVIDER_SITE_OTHER)
Admission: RE | Admit: 2014-05-04 | Discharge: 2014-05-04 | Disposition: A | Discharge status: Home / Self Care | Payer: PRIVATE HEALTH INSURANCE | Source: Ambulatory Visit | Attending: Internal Medicine | Admitting: Internal Medicine

## 2014-05-04 DIAGNOSIS — R599 Enlarged lymph nodes, unspecified: Secondary | ICD-10-CM

## 2014-05-04 DIAGNOSIS — M7989 Other specified soft tissue disorders: Secondary | ICD-10-CM

## 2014-05-04 DIAGNOSIS — R590 Localized enlarged lymph nodes: Secondary | ICD-10-CM

## 2014-05-04 MED ORDER — IOHEXOL 300 MG/ML  SOLN
100.0000 mL | Freq: Once | INTRAMUSCULAR | Status: AC | PRN
  Administered 2014-05-04: 100 mL via INTRAVENOUS

## 2014-05-24 ENCOUNTER — Ambulatory Visit (INDEPENDENT_AMBULATORY_CARE_PROVIDER_SITE_OTHER): Payer: PRIVATE HEALTH INSURANCE | Admitting: Family Medicine

## 2014-05-24 ENCOUNTER — Encounter: Payer: Self-pay | Admitting: Family Medicine

## 2014-05-24 VITALS — BP 140/80 | HR 75 | Wt 195.0 lb

## 2014-05-24 DIAGNOSIS — R609 Edema, unspecified: Secondary | ICD-10-CM

## 2014-05-24 DIAGNOSIS — R590 Localized enlarged lymph nodes: Secondary | ICD-10-CM

## 2014-05-24 DIAGNOSIS — R600 Localized edema: Secondary | ICD-10-CM

## 2014-05-24 DIAGNOSIS — R599 Enlarged lymph nodes, unspecified: Secondary | ICD-10-CM

## 2014-05-24 NOTE — Progress Notes (Signed)
Subjective:    Patient ID: Holly Beasley, female    DOB: 1951/07/01, 63 y.o.   MRN: [id number]  HPI Patient here for followup regarding left lower extremity edema in the absence of her usual primary care physician who is out of town. Refer to recent notes. These were reviewed. She developed some recent left lower extremity swelling. She did have recent flight to the [place]. She's never had any leg pain. Recent venous Dopplers revealed no evidence for DVT. She had some minimal inguinal adenopathy and patient had concerns because of mom having Hodgkin's lymphoma. She underwent CT abdomen and pelvis which was basically unremarkable.  Leg edema is worse at night and improved first thing in the morning after elevation. She denies any dyspnea. No pleuritic pain. No chest pains. Does not take any nonsteroidals. No prior lower extremity surgery.  She denies any recent appetite or weight changes. No knee pain or hip pain on left side. Recent labs unremarkable  Past Medical History  Diagnosis Date  . Osteopenia     dexa -1.7 h -1.35   . Sleep apnea   . Scoliosis     leg length discrepancy  . Allergy   . Depression   . Osteoarthritis   . Skin cancer     basal cell nose  . History of ETOH abuse     recovering  in remission  . Hyperlipidemia   . Dry eyes    Past Surgical History  Procedure Laterality Date  . Colonoscopy w/ polypectomy      precancerous   2004  . Tonsillectomy and adenoidectomy  1967  . Nasal sinus surgery  11-13-2008    polyps removed  . Breast biopsy      2003  . Skin cancer removal      basal cell face    reports that she has never smoked. She has never used smokeless tobacco. She reports that she does not drink alcohol or use illicit drugs. family history includes Alcohol abuse in her brother and brother; Arthritis in her brother; Diabetes in her mother; Heart failure in her father; Hyperlipidemia in her father and mother; Lymphoma in her  mother. Allergies  Allergen Reactions  . Amoxicillin     REACTION: unspecified  . Cephalexin     REACTION: unspecified  . Sulfamethoxazole     REACTION: unspecified      Review of Systems  Constitutional: Negative for fever, chills, appetite change and unexpected weight change.  Respiratory: Negative for cough and shortness of breath.   Cardiovascular: Positive for leg swelling. Negative for chest pain and palpitations.  Gastrointestinal: Negative for abdominal pain.  Hematological: Does not bruise/bleed easily.       Objective:   Physical Exam  Constitutional: She appears well-developed and well-nourished.  Neck: Neck supple.  Cardiovascular: Normal rate and regular rhythm.  Exam reveals no gallop.   Pulmonary/Chest: Effort normal and breath sounds normal. No respiratory distress. She has no wheezes. She has no rales.  Musculoskeletal: She exhibits edema.  Patient has trace pitting edema left lower extremity and none on the right. She does not have any calf tenderness. Feet are warm to touch with good dorsalis pedis pulses bilaterally. No abnormal skin color changes  Inguinal region is examined bilaterally with very minimal bilateral inguinal adenopathy. No tenderness  Lymphadenopathy:    She has no cervical adenopathy.          Assessment & Plan:  Left leg edema. Recent workup as above unrevealing.  Venous Dopplers and CT abdomen and pelvis unremarkable. Suspect this is all venous related. We've recommended frequent elevation and prescription for venous compression garments 20-30 mm pressure and close followup with primary. We've not recommended any diuretic therapy.  She has only minimal bilateral inguinal adenopathy which is felt to be normal exam.

## 2014-05-24 NOTE — Patient Instructions (Signed)
Follow up for any increasing leg edema or new symptoms such as shortness of breath Elevate legs frequently.

## 2014-05-24 NOTE — Progress Notes (Signed)
Pre visit review using our clinic review tool, if applicable. No additional management support is needed unless otherwise documented below in the visit note. 

## 2014-05-30 ENCOUNTER — Other Ambulatory Visit: Payer: Self-pay | Admitting: Family Medicine

## 2014-05-30 DIAGNOSIS — R600 Localized edema: Secondary | ICD-10-CM

## 2014-06-09 ENCOUNTER — Other Ambulatory Visit: Payer: Self-pay | Admitting: *Deleted

## 2014-06-09 DIAGNOSIS — R609 Edema, unspecified: Secondary | ICD-10-CM

## 2014-06-12 ENCOUNTER — Ambulatory Visit: Payer: PRIVATE HEALTH INSURANCE | Admitting: Family Medicine

## 2014-06-13 ENCOUNTER — Ambulatory Visit (INDEPENDENT_AMBULATORY_CARE_PROVIDER_SITE_OTHER): Payer: PRIVATE HEALTH INSURANCE | Admitting: Family Medicine

## 2014-06-13 DIAGNOSIS — Z23 Encounter for immunization: Secondary | ICD-10-CM

## 2014-06-16 ENCOUNTER — Encounter: Payer: Self-pay | Admitting: Vascular Surgery

## 2014-06-20 ENCOUNTER — Encounter: Payer: Self-pay | Admitting: Vascular Surgery

## 2014-06-20 ENCOUNTER — Ambulatory Visit (INDEPENDENT_AMBULATORY_CARE_PROVIDER_SITE_OTHER): Payer: PRIVATE HEALTH INSURANCE | Admitting: Vascular Surgery

## 2014-06-20 ENCOUNTER — Ambulatory Visit (HOSPITAL_COMMUNITY)
Admission: RE | Admit: 2014-06-20 | Discharge: 2014-06-20 | Disposition: A | Discharge status: Home / Self Care | Payer: 59 | Source: Ambulatory Visit | Attending: Vascular Surgery | Admitting: Vascular Surgery

## 2014-06-20 VITALS — BP 166/91 | HR 80 | Resp 16 | Ht 69.0 in | Wt 195.0 lb

## 2014-06-20 DIAGNOSIS — R600 Localized edema: Secondary | ICD-10-CM | POA: Insufficient documentation

## 2014-06-20 DIAGNOSIS — R609 Edema, unspecified: Secondary | ICD-10-CM | POA: Insufficient documentation

## 2014-06-20 DIAGNOSIS — I83893 Varicose veins of bilateral lower extremities with other complications: Secondary | ICD-10-CM | POA: Insufficient documentation

## 2014-06-20 NOTE — Progress Notes (Signed)
Subjective:     Patient ID: Holly Beasley, female   DOB: [birth year], 63 y.o.   MRN: [id number]  HPI this 63 year old female was referred for evaluation of progressive edema in the left leg with a painful varicosity in the left calf. She states that she has had edema in the left leg for several years but over the past 2 months the edema has gotten significantly worse. She has been wearing short-leg elastic compression stockings with some mild improvement. She does not elevate her regular basis and has no history of DVT, thrombophlebitis, stasis ulcers, or bleeding. She also has noted a painful varicose vein in the posterior calf on the left leg.  Past Medical History  Diagnosis Date  . Osteopenia     dexa -1.7 h -1.35   . Sleep apnea   . Scoliosis     leg length discrepancy  . Allergy   . Depression   . Osteoarthritis   . Skin cancer     basal cell nose  . History of ETOH abuse     recovering  in remission  . Hyperlipidemia   . Dry eyes     History  Substance Use Topics  . Smoking status: Never Smoker   . Smokeless tobacco: Never Used  . Alcohol Use: No     Comment: recovering    Family History  Problem Relation Age of Onset  . Arthritis Brother   . Alcohol abuse Brother   . Lymphoma Mother   . Diabetes Mother   . Hyperlipidemia Mother   . Heart failure Father   . Hyperlipidemia Father   . Alcohol abuse Brother     Allergies  Allergen Reactions  . Amoxicillin     REACTION: unspecified  . Cephalexin     REACTION: unspecified  . Sulfamethoxazole     REACTION: unspecified    Current outpatient prescriptions:atorvastatin (LIPITOR) 40 MG tablet, Take 1 tablet (40 mg total) by mouth daily., Disp: 90 tablet, Rfl: 3;  calcium gluconate 500 MG tablet, Take 500 mg by mouth 2 (two) times daily. , Disp: , Rfl: ;  carvedilol (COREG) 12.5 MG tablet, TAKE 1 TABLET BY MOUTH TWICE A DAY WITH A MEAL, Disp: 180 tablet, Rfl: 1;  ciclesonide (OMNARIS) 50 MCG/ACT nasal spray, Place 2  sprays into both nostrils daily., Disp: , Rfl:  cycloSPORINE (RESTASIS) 0.05 % ophthalmic emulsion, Place 1 drop into both eyes at bedtime as needed. , Disp: , Rfl: ;  DULoxetine (CYMBALTA) 30 MG capsule, Take 90 mg by mouth daily. , Disp: , Rfl: ;  fish oil-omega-3 fatty acids 1000 MG capsule, Take 1 g by mouth daily., Disp: , Rfl: ;  metFORMIN (GLUCOPHAGE-XR) 500 MG 24 hr tablet, Take 4 per day, Disp: 360 tablet, Rfl: 3 montelukast (SINGULAIR) 10 MG tablet, Take 1 tablet (10 mg total) by mouth at bedtime., Disp: 90 tablet, Rfl: 3;  Multiple Vitamin (MULTIVITAMIN) tablet, Take 1 tablet by mouth daily.  , Disp: , Rfl: ;  omeprazole (PRILOSEC) 40 MG capsule, Take 1 capsule (40 mg total) by mouth daily., Disp: 90 capsule, Rfl: 3;  VITAMIN D, CHOLECALCIFEROL, PO, Take by mouth.  , Disp: , Rfl:  calcipotriene-betamethasone (TACLONEX) ointment, Apply 1 application topically daily., Disp: , Rfl:   BP 166/91  Pulse 80  Resp 16  Ht 5\' 9"  (1.753 m)  Wt 195 lb (88.451 kg)  BMI 28.78 kg/m2  Body mass index is 28.78 kg/(m^2).          Review  of Systems patient has history of asleep apnea. Denies chest pain, dyspnea on exertion, PND, orthopnea. Has history of dermatitis with skin rashes. Other systems negative the complete review of systems    Objective:   Physical Exam BP 166/91  Pulse 80  Resp 16  Ht 5\' 9"  (1.753 m)  Wt 195 lb (88.451 kg)  BMI 28.78 kg/m2  Gen.-alert and oriented x3 in no apparent distress HEENT normal for age Lungs no rhonchi or wheezing Cardiovascular regular rhythm no murmurs carotid pulses 3+ palpable no bruits audible Abdomen soft nontender no palpable masses Musculoskeletal free of  major deformities Skin clear -no rashes Neurologic normal Lower extremities 3+ femoral and dorsalis pedis pulses palpable bilaterally with no edema in the right leg 2+ edema left leg-worse from distal thigh to foot. No active ulceration noted. Bulging varicosities left great saphenous  system below the knee medially and posteriorly.  Data ordered a venous duplex exam left leg to review and interpret. There is no DVT or deep vein reflux. There is gross reflux throughout the left great saphenous vein in the proximal calf to the saphenofemoral junction the large caliber vein.        Assessment:     Worsening edema left leg and painful varicosities left leg with gross reflux left great saphenous vein and no reflux in the system on the left. Symptoms are affecting patient's daily living with worsening edema.    Plan:         #1 long leg elastic compression stockings 20-30 mm gradient #2 elevate legs as much as possible #3 ibuprofen daily on a regular basis for pain #4 return in 3 months-if no significant improvement then she will need laser ablation left great saphenous vein with 10-20 stab phlebectomy of painful varicosities. Will return in 3 months for continued evaluation

## 2014-06-26 ENCOUNTER — Ambulatory Visit (INDEPENDENT_AMBULATORY_CARE_PROVIDER_SITE_OTHER): Payer: PRIVATE HEALTH INSURANCE | Admitting: Internal Medicine

## 2014-06-26 ENCOUNTER — Encounter: Payer: Self-pay | Admitting: Internal Medicine

## 2014-06-26 VITALS — BP 140/70 | Temp 97.9°F | Wt 198.0 lb

## 2014-06-26 DIAGNOSIS — M7989 Other specified soft tissue disorders: Secondary | ICD-10-CM

## 2014-06-26 DIAGNOSIS — M25562 Pain in left knee: Secondary | ICD-10-CM

## 2014-06-26 DIAGNOSIS — M25569 Pain in unspecified knee: Secondary | ICD-10-CM

## 2014-06-26 DIAGNOSIS — I10 Essential (primary) hypertension: Secondary | ICD-10-CM

## 2014-06-26 MED ORDER — MELOXICAM 15 MG PO TABS: 15.0000 mg | ORAL_TABLET | Freq: Every day | ORAL | Status: DC

## 2014-06-26 NOTE — Patient Instructions (Addendum)
This could be arthritis in the knee  Add antiinflammatory for about 2 weeks and will do referral to othopedics at this time. Agree that some exercise is good for  mainatining muscle strength in  Legs around knees   Monitor blood pressure  Antiinflammatories can sometimes raise BP readings . Contact us if 150 and over   Knee Pain The knee is the complex joint between your thigh and your lower leg. It is made up of bones, tendons, ligaments, and cartilage. The bones that make up the knee are:  The femur in the thigh.  The tibia and fibula in the lower leg.  The patella or kneecap riding in the groove on the lower femur. CAUSES  Knee pain is a common complaint with many causes. A few of these causes are:  Injury, such as:  A ruptured ligament or tendon injury.  Torn cartilage.  Medical conditions, such as:  Gout  Arthritis  Infections  Overuse, over training, or overdoing a physical activity. Knee pain can be minor or severe. Knee pain can accompany debilitating injury. Minor knee problems often respond well to self-care measures or get well on their own. More serious injuries may need medical intervention or even surgery. SYMPTOMS The knee is complex. Symptoms of knee problems can vary widely. Some of the problems are:  Pain with movement and weight bearing.  Swelling and tenderness.  Buckling of the knee.  Inability to straighten or extend your knee.  Your knee locks and you cannot straighten it.  Warmth and redness with pain and fever.  Deformity or dislocation of the kneecap. DIAGNOSIS  Determining what is wrong may be very straight forward such as when there is an injury. It can also be challenging because of the complexity of the knee. Tests to make a diagnosis may include:  Your caregiver taking a history and doing a physical exam.  Routine X-rays can be used to rule out other problems. X-rays will not reveal a cartilage tear. Some injuries of the knee can be  diagnosed by:  Arthroscopy a surgical technique by which a small video camera is inserted through tiny incisions on the sides of the knee. This procedure is used to examine and repair internal knee joint problems. Tiny instruments can be used during arthroscopy to repair the torn knee cartilage (meniscus).  Arthrography is a radiology technique. A contrast liquid is directly injected into the knee joint. Internal structures of the knee joint then become visible on X-ray film.  An MRI scan is a non X-ray radiology procedure in which magnetic fields and a computer produce two- or three-dimensional images of the inside of the knee. Cartilage tears are often visible using an MRI scanner. MRI scans have largely replaced arthrography in diagnosing cartilage tears of the knee.  Blood work.  Examination of the fluid that helps to lubricate the knee joint (synovial fluid). This is done by taking a sample out using a needle and a syringe. TREATMENT The treatment of knee problems depends on the cause. Some of these treatments are:  Depending on the injury, proper casting, splinting, surgery, or physical therapy care will be needed.  Give yourself adequate recovery time. Do not overuse your joints. If you begin to get sore during workout routines, back off. Slow down or do fewer repetitions.  For repetitive activities such as cycling or running, maintain your strength and nutrition.  Alternate muscle groups. For example, if you are a weight lifter, work the upper body on one day  and the lower body the next.  Either tight or weak muscles do not give the proper support for your knee. Tight or weak muscles do not absorb the stress placed on the knee joint. Keep the muscles surrounding the knee strong.  Take care of mechanical problems.  If you have flat feet, orthotics or special shoes may help. See your caregiver if you need help.  Arch supports, sometimes with wedges on the inner or outer aspect of  the heel, can help. These can shift pressure away from the side of the knee most bothered by osteoarthritis.  A brace called an "unloader" brace also may be used to help ease the pressure on the most arthritic side of the knee.  If your caregiver has prescribed crutches, braces, wraps or ice, use as directed. The acronym for this is PRICE. This means protection, rest, ice, compression, and elevation.  Nonsteroidal anti-inflammatory drugs (NSAIDs), can help relieve pain. But if taken immediately after an injury, they may actually increase swelling. Take NSAIDs with food in your stomach. Stop them if you develop stomach problems. Do not take these if you have a history of ulcers, stomach pain, or bleeding from the bowel. Do not take without your caregiver's approval if you have problems with fluid retention, heart failure, or kidney problems.  For ongoing knee problems, physical therapy may be helpful.  Glucosamine and chondroitin are over-the-counter dietary supplements. Both may help relieve the pain of osteoarthritis in the knee. These medicines are different from the usual anti-inflammatory drugs. Glucosamine may decrease the rate of cartilage destruction.  Injections of a corticosteroid drug into your knee joint may help reduce the symptoms of an arthritis flare-up. They may provide pain relief that lasts a few months. You may have to wait a few months between injections. The injections do have a small increased risk of infection, water retention, and elevated blood sugar levels.  Hyaluronic acid injected into damaged joints may ease pain and provide lubrication. These injections may work by reducing inflammation. A series of shots may give relief for as long as 6 months.  Topical painkillers. Applying certain ointments to your skin may help relieve the pain and stiffness of osteoarthritis. Ask your pharmacist for suggestions. Many over the-counter products are approved for temporary relief of  arthritis pain.  In some countries, doctors often prescribe topical NSAIDs for relief of chronic conditions such as arthritis and tendinitis. A review of treatment with NSAID creams found that they worked as well as oral medications but without the serious side effects. PREVENTION  Maintain a healthy weight. Extra pounds put more strain on your joints.  Get strong, stay limber. Weak muscles are a common cause of knee injuries. Stretching is important. Include flexibility exercises in your workouts.  Be smart about exercise. If you have osteoarthritis, chronic knee pain or recurring injuries, you may need to change the way you exercise. This does not mean you have to stop being active. If your knees ache after jogging or playing basketball, consider switching to swimming, water aerobics, or other low-impact activities, at least for a few days a week. Sometimes limiting high-impact activities will provide relief.  Make sure your shoes fit well. Choose footwear that is right for your sport.  Protect your knees. Use the proper gear for knee-sensitive activities. Use kneepads when playing volleyball or laying carpet. Buckle your seat belt every time you drive. Most shattered kneecaps occur in car accidents.  Rest when you are tired. SEEK MEDICAL CARE IF:  You have knee pain that is continual and does not seem to be getting better.  SEEK IMMEDIATE MEDICAL CARE IF:  Your knee joint feels hot to the touch and you have a high fever. MAKE SURE YOU:   Understand these instructions.  Will watch your condition.  Will get help right away if you are not doing well or get worse. Document Released: 07/27/2007 Document Revised: 12/22/2011 Document Reviewed: 07/27/2007 [facility] Patient Information 2015 [place], [place]. This information is not intended to replace advice given to you by your health care provider. Make sure you discuss any questions you have with your health care provider.

## 2014-06-26 NOTE — Progress Notes (Signed)
Pre visit review using our clinic review tool, if applicable. No additional management support is needed unless otherwise documented below in the visit note.  Chief Complaint  Patient presents with  . Knee Pain    HPI: Patient Holly Beasley  comes in today for SDA for  new problem evaluation.  She has had recent evaluation for left lower leg swelling and edema and eventually  Saw Dr  Kellie Simmering last week and hving v v S:  Hand now has thoigh high stocking    But int he interim having painful knee pain .since July .  But pain getting worse andmibility is an issue  doesn't remember a specific injury. Ibuprofen ont helpful and  Took left over 10 325 hydrocodone sometimes.  In this helps with pain. Feels it is swollen and Dr. Kellie Simmering feels that it may be a separate issue. No fever or other redness joint swellings. See pertinent positives and negatives per HPI.  Past Medical History  Diagnosis Date  . Osteopenia     dexa -1.7 h -1.35   . Sleep apnea   . Scoliosis     leg length discrepancy  . Allergy   . Depression   . Osteoarthritis   . Skin cancer     basal cell nose  . History of ETOH abuse     recovering  in remission  . Hyperlipidemia   . Dry eyes     Family History  Problem Relation Age of Onset  . Arthritis Brother   . Alcohol abuse Brother   . Lymphoma Mother   . Diabetes Mother   . Hyperlipidemia Mother   . Heart failure Father   . Hyperlipidemia Father   . Alcohol abuse Brother     History   Social History  . Marital Status: Married    Spouse Name: N/A    Number of Children: N/A  . Years of Education: N/A   Social History Main Topics  . Smoking status: Never Smoker   . Smokeless tobacco: Never Used  . Alcohol Use: No     Comment: recovering  . Drug Use: No  . Sexual Activity: None   Other Topics Concern  . 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 atorvastatin (LIPITOR) 40 MG tablet Take 1 tablet (40 mg total) by mouth daily.  . calcium gluconate 500 MG tablet Take 500 mg by mouth 2 (two) times daily.   . carvedilol (COREG) 12.5 MG tablet TAKE 1 TABLET BY MOUTH TWICE A DAY WITH A MEAL  . ciclesonide (OMNARIS) 50 MCG/ACT nasal spray Place 2 sprays into both nostrils daily.  . cycloSPORINE (RESTASIS) 0.05 % ophthalmic emulsion Place 1 drop into both eyes at bedtime as needed.   . DULoxetine (CYMBALTA) 30 MG capsule Take 90 mg by mouth daily.   . fish oil-omega-3 fatty acids 1000 MG capsule Take 1 g by mouth daily.  . metFORMIN (GLUCOPHAGE-XR) 500 MG 24 hr tablet Take 4 per day  . montelukast (SINGULAIR) 10 MG tablet Take 1 tablet (10 mg total) by mouth at bedtime.  . Multiple Vitamin (MULTIVITAMIN) tablet Take  1 tablet by mouth daily.    Marland Kitchen omeprazole (PRILOSEC) 40 MG capsule Take 1 capsule (40 mg total) by mouth daily.  Marland Kitchen VITAMIN D, CHOLECALCIFEROL, PO Take by mouth.    . meloxicam (MOBIC) 15 MG tablet Take 1 tablet (15 mg total) by mouth daily. For  2 weeks and then as needed  . [DISCONTINUED] calcipotriene-betamethasone (TACLONEX) ointment Apply 1 application topically daily.    EXAM:  BP 140/70  Temp(Src) 97.9 F (36.6 C) (Oral)  Wt 198 lb (89.812 kg)  Body mass index is 29.23 kg/(m^2).  GENERAL: vitals reviewed and listed above, alert, oriented, appears well hydrated and in no acute distress HEENT: atraumatic, conjunctiva  clear, no obvious abnormalities on inspection of external nose and ears  Extremities left with thigh compression stocking left knee is larger than right knee with some mild effusion. Patellar. No focal tenderness although along the joint lines may be more uncomfortable. Negative obvious drawer no grinding.MS: moves all extremities  PSYCH: pleasant and  cooperative, no obvious depression or anxiety  ASSESSMENT AND PLAN:  Discussed the following assessment and plan:  Knee pain, acute, left - subacute most like ly OA  mobic trial and referral under eval for unilateral edema varicosity  - Plan: Ambulatory referral to Orthopedic Surgery  Unspecified essential hypertension - caution with nsaid    Left leg swelling - felt to be from Venous insufficicney  at this time   -Patient advised to return or notify health care team  if symptoms worsen ,persist or new concerns arise.  Patient Instructions  This could be arthritis in the knee  Add antiinflammatory for about 2 weeks and will do referral to othopedics at this time. Agree that some exercise is good for  mainatining muscle strength in  Legs around knees   Monitor blood pressure  Antiinflammatories can sometimes raise BP readings . Contact us if 150 and over   Knee Pain The knee is the complex joint between your thigh and your lower leg. It is made up of bones, tendons, ligaments, and cartilage. The bones that make up the knee are:  The femur in the thigh.  The tibia and fibula in the lower leg.  The patella or kneecap riding in the groove on the lower femur. CAUSES  Knee pain is a common complaint with many causes. A few of these causes are:  Injury, such as:  A ruptured ligament or tendon injury.  Torn cartilage.  Medical conditions, such as:  Gout  Arthritis  Infections  Overuse, over training, or overdoing a physical activity. Knee pain can be minor or severe. Knee pain can accompany debilitating injury. Minor knee problems often respond well to self-care measures or get well on their own. More serious injuries may need medical intervention or even surgery. SYMPTOMS The knee is complex. Symptoms of knee problems can vary widely. Some of the problems are:  Pain with movement and weight bearing.  Swelling and tenderness.  Buckling of the knee.  Inability to  straighten or extend your knee.  Your knee locks and you cannot straighten it.  Warmth and redness with pain and fever.  Deformity or dislocation of the kneecap. DIAGNOSIS  Determining what is wrong may be very straight forward such as when there is an injury. It can also be challenging because of the complexity of the knee. Tests to make a diagnosis may include:  Your caregiver taking a history and doing a physical exam.  Routine X-rays can be used to rule  out other problems. X-rays will not reveal a cartilage tear. Some injuries of the knee can be diagnosed by:  Arthroscopy a surgical technique by which a small video camera is inserted through tiny incisions on the sides of the knee. This procedure is used to examine and repair internal knee joint problems. Tiny instruments can be used during arthroscopy to repair the torn knee cartilage (meniscus).  Arthrography is a radiology technique. A contrast liquid is directly injected into the knee joint. Internal structures of the knee joint then become visible on X-ray film.  An MRI scan is a non X-ray radiology procedure in which magnetic fields and a computer produce two- or three-dimensional images of the inside of the knee. Cartilage tears are often visible using an MRI scanner. MRI scans have largely replaced arthrography in diagnosing cartilage tears of the knee.  Blood work.  Examination of the fluid that helps to lubricate the knee joint (synovial fluid). This is done by taking a sample out using a needle and a syringe. TREATMENT The treatment of knee problems depends on the cause. Some of these treatments are:  Depending on the injury, proper casting, splinting, surgery, or physical therapy care will be needed.  Give yourself adequate recovery time. Do not overuse your joints. If you begin to get sore during workout routines, back off. Slow down or do fewer repetitions.  For repetitive activities such as cycling or running,  maintain your strength and nutrition.  Alternate muscle groups. For example, if you are a weight lifter, work the upper body on one day and the lower body the next.  Either tight or weak muscles do not give the proper support for your knee. Tight or weak muscles do not absorb the stress placed on the knee joint. Keep the muscles surrounding the knee strong.  Take care of mechanical problems.  If you have flat feet, orthotics or special shoes may help. See your caregiver if you need help.  Arch supports, sometimes with wedges on the inner or outer aspect of the heel, can help. These can shift pressure away from the side of the knee most bothered by osteoarthritis.  A brace called an "unloader" brace also may be used to help ease the pressure on the most arthritic side of the knee.  If your caregiver has prescribed crutches, braces, wraps or ice, use as directed. The acronym for this is PRICE. This means protection, rest, ice, compression, and elevation.  Nonsteroidal anti-inflammatory drugs (NSAIDs), can help relieve pain. But if taken immediately after an injury, they may actually increase swelling. Take NSAIDs with food in your stomach. Stop them if you develop stomach problems. Do not take these if you have a history of ulcers, stomach pain, or bleeding from the bowel. Do not take without your caregiver's approval if you have problems with fluid retention, heart failure, or kidney problems.  For ongoing knee problems, physical therapy may be helpful.  Glucosamine and chondroitin are over-the-counter dietary supplements. Both may help relieve the pain of osteoarthritis in the knee. These medicines are different from the usual anti-inflammatory drugs. Glucosamine may decrease the rate of cartilage destruction.  Injections of a corticosteroid drug into your knee joint may help reduce the symptoms of an arthritis flare-up. They may provide pain relief that lasts a few months. You may have to wait  a few months between injections. The injections do have a small increased risk of infection, water retention, and elevated blood sugar levels.  Hyaluronic acid injected into  damaged joints may ease pain and provide lubrication. These injections may work by reducing inflammation. A series of shots may give relief for as long as 6 months.  Topical painkillers. Applying certain ointments to your skin may help relieve the pain and stiffness of osteoarthritis. Ask your pharmacist for suggestions. Many over the-counter products are approved for temporary relief of arthritis pain.  In some countries, doctors often prescribe topical NSAIDs for relief of chronic conditions such as arthritis and tendinitis. A review of treatment with NSAID creams found that they worked as well as oral medications but without the serious side effects. PREVENTION  Maintain a healthy weight. Extra pounds put more strain on your joints.  Get strong, stay limber. Weak muscles are a common cause of knee injuries. Stretching is important. Include flexibility exercises in your workouts.  Be smart about exercise. If you have osteoarthritis, chronic knee pain or recurring injuries, you may need to change the way you exercise. This does not mean you have to stop being active. If your knees ache after jogging or playing basketball, consider switching to swimming, water aerobics, or other low-impact activities, at least for a few days a week. Sometimes limiting high-impact activities will provide relief.  Make sure your shoes fit well. Choose footwear that is right for your sport.  Protect your knees. Use the proper gear for knee-sensitive activities. Use kneepads when playing volleyball or laying carpet. Buckle your seat belt every time you drive. Most shattered kneecaps occur in car accidents.  Rest when you are tired. SEEK MEDICAL CARE IF:  You have knee pain that is continual and does not seem to be getting better.  SEEK  IMMEDIATE MEDICAL CARE IF:  Your knee joint feels hot to the touch and you have a high fever. MAKE SURE YOU:   Understand these instructions.  Will watch your condition.  Will get help right away if you are not doing well or get worse. Document Released: 07/27/2007 Document Revised: 12/22/2011 Document Reviewed: 07/27/2007 [facility] Patient Information 2015 [place], [place]. This information is not intended to replace advice given to you by your health care provider. Make sure you discuss any questions you have with your health care provider.      Standley Brooking. Loc Feinstein M.D.

## 2014-07-11 ENCOUNTER — Encounter: Payer: PRIVATE HEALTH INSURANCE | Admitting: Vascular Surgery

## 2014-07-11 ENCOUNTER — Encounter (HOSPITAL_COMMUNITY): Payer: PRIVATE HEALTH INSURANCE

## 2014-07-18 ENCOUNTER — Other Ambulatory Visit: Payer: Self-pay

## 2014-07-18 DIAGNOSIS — Z1231 Encounter for screening mammogram for malignant neoplasm of breast: Secondary | ICD-10-CM

## 2014-07-19 ENCOUNTER — Encounter: Payer: Self-pay | Admitting: Internal Medicine

## 2014-07-20 ENCOUNTER — Other Ambulatory Visit: Payer: Self-pay | Admitting: Internal Medicine

## 2014-07-21 NOTE — Telephone Encounter (Signed)
Pt scheduled for CPX on 08/30/14.  Sent to the pharmacy by e-scribe.

## 2014-07-24 ENCOUNTER — Other Ambulatory Visit: Payer: Self-pay | Admitting: Internal Medicine

## 2014-07-24 NOTE — Telephone Encounter (Signed)
#  [phone] sent to the pharmacy by e-scribe.  Pt has upcoming CPE on 08/30/14.

## 2014-07-28 ENCOUNTER — Ambulatory Visit
Admission: RE | Admit: 2014-07-28 | Discharge: 2014-07-28 | Disposition: A | Discharge status: Home / Self Care | Payer: 59 | Source: Ambulatory Visit

## 2014-07-28 DIAGNOSIS — Z1231 Encounter for screening mammogram for malignant neoplasm of breast: Secondary | ICD-10-CM

## 2014-08-17 ENCOUNTER — Other Ambulatory Visit: Payer: Self-pay | Admitting: Internal Medicine

## 2014-08-17 NOTE — Telephone Encounter (Signed)
Sent to the pharmacy by e-scribe.  Pt has upcoming appt on 08/30/14

## 2014-08-23 ENCOUNTER — Other Ambulatory Visit (INDEPENDENT_AMBULATORY_CARE_PROVIDER_SITE_OTHER): Payer: 59

## 2014-08-23 DIAGNOSIS — E785 Hyperlipidemia, unspecified: Secondary | ICD-10-CM

## 2014-08-23 DIAGNOSIS — Z0000 Encounter for general adult medical examination without abnormal findings: Secondary | ICD-10-CM

## 2014-08-23 LAB — HEPATIC FUNCTION PANEL
ALT: 21 U/L (ref 0–35)
AST: 20 U/L (ref 0–37)
Albumin: 3.5 g/dL (ref 3.5–5.2)
Alkaline Phosphatase: 65 U/L (ref 39–117)
BILIRUBIN TOTAL: 0.6 mg/dL (ref 0.2–1.2)
Bilirubin, Direct: 0.1 mg/dL (ref 0.0–0.3)
Total Protein: 7.1 g/dL (ref 6.0–8.3)

## 2014-08-23 LAB — BASIC METABOLIC PANEL
BUN: 13 mg/dL (ref 6–23)
CHLORIDE: 104 meq/L (ref 96–112)
CO2: 23 mEq/L (ref 19–32)
Calcium: 9.5 mg/dL (ref 8.4–10.5)
Creatinine, Ser: 0.8 mg/dL (ref 0.4–1.2)
GFR: 78.01 mL/min (ref >60.00)
GLUCOSE: 152 mg/dL — AB (ref 70–99)
POTASSIUM: 4.4 meq/L (ref 3.5–5.1)
Sodium: 138 mEq/L (ref 135–145)

## 2014-08-23 LAB — CBC WITH DIFFERENTIAL/PLATELET
Basophils Absolute: 0.1 10*3/uL (ref 0.0–0.1)
Basophils Relative: 0.7 % (ref 0.0–3.0)
EOS PCT: 3.8 % (ref 0.0–5.0)
Eosinophils Absolute: 0.3 10*3/uL (ref 0.0–0.7)
HEMATOCRIT: 39.5 % (ref 36.0–46.0)
Hemoglobin: 13 g/dL (ref 12.0–15.0)
LYMPHS ABS: 3.9 10*3/uL (ref 0.7–4.0)
Lymphocytes Relative: 48.2 % — ABNORMAL HIGH (ref 12.0–46.0)
MCHC: 33.1 g/dL (ref 30.0–36.0)
MCV: 93.1 fl (ref 78.0–100.0)
MONOS PCT: 8.8 % (ref 3.0–12.0)
Monocytes Absolute: 0.7 10*3/uL (ref 0.1–1.0)
NEUTROS ABS: 3.1 10*3/uL (ref 1.4–7.7)
Neutrophils Relative %: 38.5 % — ABNORMAL LOW (ref 43.0–77.0)
PLATELETS: 300 10*3/uL (ref 150.0–400.0)
RBC: 4.24 Mil/uL (ref 3.87–5.11)
RDW: 12.8 % (ref 11.5–15.5)
WBC: 8 10*3/uL (ref 4.0–10.5)

## 2014-08-23 LAB — MICROALBUMIN / CREATININE URINE RATIO
CREATININE, U: 80.7 mg/dL
MICROALB UR: 0.6 mg/dL (ref 0.0–1.9)
Microalb Creat Ratio: 0.7 mg/g (ref 0.0–30.0)

## 2014-08-23 LAB — LIPID PANEL
Cholesterol: 166 mg/dL (ref 0–200)
HDL: 36.8 mg/dL — AB (ref >39.00)
NonHDL: 129.2
TRIGLYCERIDES: 236 mg/dL — AB (ref 0.0–149.0)
Total CHOL/HDL Ratio: 5
VLDL: 47.2 mg/dL — ABNORMAL HIGH (ref 0.0–40.0)

## 2014-08-23 LAB — LDL CHOLESTEROL, DIRECT: Direct LDL: 95.1 mg/dL

## 2014-08-23 LAB — HEMOGLOBIN A1C: Hgb A1c MFr Bld: 6.9 % — ABNORMAL HIGH (ref 4.6–6.5)

## 2014-08-23 LAB — TSH: TSH: 1.8 u[IU]/mL (ref 0.35–4.50)

## 2014-08-28 ENCOUNTER — Ambulatory Visit (INDEPENDENT_AMBULATORY_CARE_PROVIDER_SITE_OTHER): Payer: 59 | Admitting: Internal Medicine

## 2014-08-28 ENCOUNTER — Encounter: Payer: Self-pay | Admitting: Internal Medicine

## 2014-08-28 VITALS — BP 136/70 | Temp 98.4°F | Ht 69.0 in | Wt 193.0 lb

## 2014-08-28 DIAGNOSIS — Z8709 Personal history of other diseases of the respiratory system: Secondary | ICD-10-CM

## 2014-08-28 DIAGNOSIS — J069 Acute upper respiratory infection, unspecified: Secondary | ICD-10-CM

## 2014-08-28 MED ORDER — DOXYCYCLINE HYCLATE 100 MG PO TABS: 100.0000 mg | ORAL_TABLET | Freq: Two times a day (BID) | ORAL | Status: DC

## 2014-08-28 NOTE — Progress Notes (Signed)
Pre visit review using our clinic review tool, if applicable. No additional management support is needed unless otherwise documented below in the visit note.  Chief Complaint  Patient presents with  . Cough    Started on Saturday  . Chills  . Night Sweats  . Nasal Congestion  . Sinus Pain/Pressure    HPI: Patient Holly Beasley  comes in today for SDA for  new problem evaluation. Onset 3-4 days ago . Uri sx cough chills green d/c some pnd ha for hours .    Husband had illness for 3 weeks and then got antibiotic for the cough to get better .  Doing better since had sinus surgery.   ROS: See pertinent positives and negatives per HPI. No  Fever .  No sob no wheezing   Past Medical History  Diagnosis Date  . Osteopenia     dexa -1.7 h -1.35   . Sleep apnea   . Scoliosis     leg length discrepancy  . Allergy   . Depression   . Osteoarthritis   . Skin cancer     basal cell nose  . History of ETOH abuse     recovering  in remission  . Hyperlipidemia   . Dry eyes     Family History  Problem Relation Age of Onset  . Arthritis Brother   . Alcohol abuse Brother   . Lymphoma Mother   . Diabetes Mother   . Hyperlipidemia Mother   . Heart failure Father   . Hyperlipidemia Father   . Alcohol abuse Brother     History   Social History  . Marital Status: Married    Spouse Name: N/A    Number of Children: N/A  . Years of Education: N/A   Social History Main Topics  . Smoking status: Never Smoker   . Smokeless tobacco: Never Used  . Alcohol Use: No     Comment: recovering  . Drug Use: No  . Sexual Activity: None   Other Topics Concern  . 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 atorvastatin (LIPITOR) 40 MG  tablet Take 1 tablet (40 mg total) by mouth daily.  . calcium gluconate 500 MG tablet Take 500 mg by mouth 2 (two) times daily.   . carvedilol (COREG) 12.5 MG tablet TAKE 1 TABLET BY MOUTH TWICE A DAY WITH A MEAL  . ciclesonide (OMNARIS) 50 MCG/ACT nasal spray Place 2 sprays into both nostrils daily.  . cycloSPORINE (RESTASIS) 0.05 % ophthalmic emulsion Place 1 drop into both eyes at bedtime as needed.   . DULoxetine (CYMBALTA) 30 MG capsule Take 90 mg by mouth daily.   . fish oil-omega-3 fatty acids 1000 MG capsule Take 1 g by mouth daily.  . meloxicam (MOBIC) 15 MG tablet Take 1 tablet (15 mg total) by mouth daily. For  2 weeks and then as needed  . metFORMIN (GLUCOPHAGE-XR) 500 MG 24 hr tablet Take 4 per day  . montelukast (SINGULAIR) 10 MG tablet TAKE 1 TABLET AT BEDTIME  . Multiple Vitamin (MULTIVITAMIN) tablet Take 1 tablet by mouth daily.    Marland Kitchen  omeprazole (PRILOSEC) 40 MG capsule TAKE 1 CAPSULE BY MOUTH ONCE DAILY  . VITAMIN D, CHOLECALCIFEROL, PO Take by mouth.    . doxycycline (VIBRA-TABS) 100 MG tablet Take 1 tablet (100 mg total) by mouth 2 (two) times daily.    EXAM:  BP 136/70 mmHg  Temp(Src) 98.4 F (36.9 C) (Oral)  Ht 5\' 9"  (1.753 m)  Wt 193 lb (87.544 kg)  BMI 28.49 kg/m2  Body mass index is 28.49 kg/(m^2).  GENERAL: vitals reviewed and listed above, alert, oriented, appears well hydrated and in no acute distress mild ocngestion HEENT: atraumatic, conjunctiva  clear, no obvious abnormalities on inspection of external nose and ears  tms clear  Nares  Congestion OP : no lesion edema or exudate  Face non tender at this time  NECK: no obvious masses on inspection palpation  LUNGS: clear to auscultation bilaterally, no wheezes, rales or rhonchi, good air movement CV: HRRR, no clubbing cyanosis  MS: moves all extremities without noticeable focal  abnormality PSYCH: pleasant and cooperative, no obvious depression or anxiety  ASSESSMENT AND PLAN:  Discussed the following  assessment and plan:  Acute upper respiratory infection of multiple sites - at risk prob viral at this time  can add antoibiotoic if progressing sinus inf sx   Hx of bacterial sinusitis - hx surgery and frontal sinus infection  -Patient advised to return or notify health care team  if symptoms worsen ,persist or new concerns arise.  Patient Instructions  This seems like a viral respiratory infection  And sinus involvement that of usually resoolves on its own in another week or so . However because of you risk of sinus infection problem.   i f HEad pain and worsening of sinus sx  Can add antibiotic .  Marland Kitchen  Change your CPX to next tues Nov 24th    Wanda K. Panosh M.D.

## 2014-08-28 NOTE — Patient Instructions (Signed)
This seems like a viral respiratory infection  And sinus involvement that of usually resoolves on its own in another week or so . However because of you risk of sinus infection problem.   i f HEad pain and worsening of sinus sx  Can add antibiotic .  Marland Kitchen  Change your CPX to next tues Nov 24th

## 2014-08-30 ENCOUNTER — Encounter: Payer: PRIVATE HEALTH INSURANCE | Admitting: Internal Medicine

## 2014-09-15 ENCOUNTER — Encounter: Payer: Self-pay | Admitting: Internal Medicine

## 2014-09-15 ENCOUNTER — Ambulatory Visit (INDEPENDENT_AMBULATORY_CARE_PROVIDER_SITE_OTHER): Payer: 59 | Admitting: Internal Medicine

## 2014-09-15 VITALS — BP 142/80 | Temp 98.1°F | Ht 67.75 in | Wt 194.1 lb

## 2014-09-15 DIAGNOSIS — Z0000 Encounter for general adult medical examination without abnormal findings: Secondary | ICD-10-CM

## 2014-09-15 DIAGNOSIS — I10 Essential (primary) hypertension: Secondary | ICD-10-CM

## 2014-09-15 DIAGNOSIS — E785 Hyperlipidemia, unspecified: Secondary | ICD-10-CM

## 2014-09-15 DIAGNOSIS — E119 Type 2 diabetes mellitus without complications: Secondary | ICD-10-CM

## 2014-09-15 MED ORDER — LISINOPRIL 10 MG PO TABS: 10.0000 mg | ORAL_TABLET | Freq: Every day | ORAL | Status: DC

## 2014-09-15 NOTE — Progress Notes (Signed)
Pre visit review using our clinic review tool, if applicable. No additional management support is needed unless otherwise documented below in the visit note.   Chief Complaint  Patient presents with  . Annual Exam  . Diabetes  . Hypertension    HPI: Patient  Holly Beasley  63 y.o. comes in today for Preventive Health Care visit   Update : Left leg arthritis  Cartilage and venous in compression to do  Ablation ?  Sinus better didn't have to take antibiotic  Bp readings up at night 150/160 range  .Marland Kitchen Dm on metformin  b blocker helps with performance anxiety Wears night guard at night .  Some teeth issues with this  Implant  Asks about lipitor and causing diabetes . No se of med Health Maintenance  Topic Date Due  . PNEUMOCOCCAL POLYSACCHARIDE VACCINE (1) 03/05/1953  . FOOT EXAM  03/05/1961  . PAP SMEAR  11/13/2012  . OPHTHALMOLOGY EXAM  07/05/2014  . HEMOGLOBIN A1C  02/21/2015  . INFLUENZA VACCINE  05/14/2015  . URINE MICROALBUMIN  08/24/2015  . MAMMOGRAM  07/28/2016  . TETANUS/TDAP  04/03/2018  . COLONOSCOPY  02/08/2019  . ZOSTAVAX  Completed   Health Maintenance Review LIFESTYLE:  Exercise:   Tobacco/ETS:  NO Alcohol: per day NONE Sugar beverages:     DIET SODA  Sleep: 4-10  Drug use: no Bone density:  Colonoscopy:  PAP:  LAST PAP 2011  DR Mody   Neg bx  MAMMO:  UTD  taking yogurt and vit d    ROS:  GEN/ HEENT: No fever, significant weight changes sweats headaches vision problems hearing changes, CV/ PULM; No chest pain shortness of breath cough, syncope,edema  change in exercise tolerance. GI /GU: No adominal pain, vomiting, change in bowel habits. No blood in the stool. No significant GU symptoms. SKIN/HEME: ,no acute skin rashes suspicious lesions or bleeding. No lymphadenopathy, nodules, masses.  NEURO/ PSYCH:  No neurologic signs such as weakness numbness. No depression anxiety. IMM/ Allergy: No unusual infections.  Allergy .   REST of 12 system review  negative except as per HPI   Past Medical History  Diagnosis Date  . Osteopenia     dexa -1.7 h -1.35   . Sleep apnea   . Scoliosis     leg length discrepancy  . Allergy   . Depression   . Osteoarthritis   . Skin cancer     basal cell nose  . History of ETOH abuse     recovering  in remission  . Hyperlipidemia   . Dry eyes     Past Surgical History  Procedure Laterality Date  . Colonoscopy w/ polypectomy      precancerous   2004  . Tonsillectomy and adenoidectomy  1967  . Nasal sinus surgery  11-13-2008    polyps removed  . Breast biopsy      2003  . Skin cancer removal      basal cell face    Family History  Problem Relation Age of Onset  . Arthritis Brother   . Alcohol abuse Brother   . Lymphoma Mother   . Diabetes Mother   . Hyperlipidemia Mother   . Heart failure Father   . Hyperlipidemia Father   . Alcohol abuse Brother     History   Social History  . Marital Status: Married    Spouse Name: N/A    Number of Children: N/A  . Years of Education: N/A   Social History Main  Topics  . Smoking status: Never Smoker   . Smokeless tobacco: Never Used  . Alcohol Use: No     Comment: recovering  . Drug Use: No  . Sexual Activity: None   Other Topics Concern  . 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    Retired Conservation officer, nature   Married   ETOH recovering   [place] 2    Has family iin [place]             Outpatient Encounter Prescriptions as of 09/15/2014  Medication Sig  . atorvastatin (LIPITOR) 40 MG tablet Take 40 mg by mouth daily.   Marland Kitchen CALCIUM PO Take 500 mg by mouth 2 (two) times daily.  . carvedilol (COREG) 12.5 MG tablet Take 12.5 mg by mouth 2 (two) times daily with a meal.   . Cholecalciferol (VITAMIN D PO) Take 500 Units by mouth daily.  . ciclesonide (OMNARIS) 50 MCG/ACT nasal spray Place 2 sprays into both nostrils daily.  . cycloSPORINE (RESTASIS) 0.05 %  ophthalmic emulsion Place 1 drop into both eyes at bedtime.  . DULoxetine (CYMBALTA) 60 MG capsule Take 120 mg by mouth at bedtime.   . metFORMIN (GLUCOPHAGE) 500 MG tablet Take 3-4 tablets daily with a meal  . montelukast (SINGULAIR) 10 MG tablet Take 10 mg by mouth at bedtime.  . MULTIPLE VITAMIN PO Take 1 tablet by mouth daily.  . Omega-3 Fatty Acids (FISH OIL) 1000 MG CAPS Take 1 capsule by mouth daily.  Marland Kitchen omeprazole (PRILOSEC) 40 MG capsule Take 40 mg by mouth daily.  . [DISCONTINUED] Biotin (BIOTIN 5000) 5 MG CAPS Take 2 capsules by mouth daily.  . [DISCONTINUED] calcium gluconate 500 MG tablet Take 500 mg by mouth 2 (two) times daily.   . [DISCONTINUED] carvedilol (COREG) 12.5 MG tablet TAKE 1 TABLET BY MOUTH TWICE A DAY WITH A MEAL  . [DISCONTINUED] ciclesonide (OMNARIS) 50 MCG/ACT nasal spray Place 2 sprays into both nostrils daily.  . [DISCONTINUED] cycloSPORINE (RESTASIS) 0.05 % ophthalmic emulsion Place 1 drop into both eyes at bedtime as needed.   . [DISCONTINUED] DULoxetine (CYMBALTA) 60 MG capsule Take 120 mg by mouth at bedtime.  . [DISCONTINUED] fish oil-omega-3 fatty acids 1000 MG capsule Take 1 g by mouth daily.  . [DISCONTINUED] meloxicam (MOBIC) 15 MG tablet Take 1 tablet (15 mg total) by mouth daily. For  2 weeks and then as needed  . [DISCONTINUED] metFORMIN (GLUCOPHAGE-XR) 500 MG 24 hr tablet Take 4 per day  . [DISCONTINUED] montelukast (SINGULAIR) 10 MG tablet TAKE 1 TABLET AT BEDTIME  . [DISCONTINUED] Multiple Vitamin (MULTIVITAMIN) tablet Take 1 tablet by mouth daily.    . [DISCONTINUED] omeprazole (PRILOSEC) 40 MG capsule TAKE 1 CAPSULE BY MOUTH ONCE DAILY  . [DISCONTINUED] VITAMIN D, CHOLECALCIFEROL, PO Take by mouth.    Marland Kitchen lisinopril (PRINIVIL,ZESTRIL) 10 MG tablet Take 1 tablet (10 mg total) by mouth daily.  . [DISCONTINUED] atorvastatin (LIPITOR) 40 MG tablet Take 1 tablet (40 mg total) by mouth daily. (Patient not taking: Reported on 09/15/2014)  . [DISCONTINUED]  doxycycline (VIBRA-TABS) 100 MG tablet Take 1 tablet (100 mg total) by mouth 2 (two) times daily.  . [DISCONTINUED] DULoxetine (CYMBALTA) 30 MG capsule Take 90 mg by mouth daily.     EXAM:  BP 142/80 mmHg  Temp(Src) 98.1 F (36.7 C) (Oral)  Ht 5' 7.75" (1.721 m)  Wt 194 lb  1.6 oz (88.043 kg)  BMI 29.73 kg/m2  Body mass index is 29.73 kg/(m^2).  Physical Exam: Vital signs reviewed TYO:MAYO is a well-developed well-nourished alert cooperative    who appearsr stated age in no acute distress.  HEENT: normocephalic atraumatic , Eyes: PERRL EOM's full, conjunctiva clear, Nares: paten,t no deformity discharge or tenderness., Ears: no deformity EAC's clear TMs with normal landmarks. Mouth: clear OP, no lesions, edema.  Moist mucous membranes. Dentition in adequate repair. NECK: supple without masses, thyromegaly or bruits. CHEST/PULM:  Clear to auscultation and percussion breath sounds equal no wheeze , rales or rhonchi. No chest wall deformities or tenderness. Mild kyphosis CV: PMI is nondisplaced, S1 S2 no gallops, murmurs, rubs. Peripheral pulses are full without delay.No JVD .  Breast: normal by inspection . No dimpling, discharge, masses, tenderness or discharge . ABDOMEN: Bowel sounds normal nontender  No guard or rebound, no hepato splenomegal no CVA tenderness.  No hernia. Extremtities:  No clubbing cyanosis or edema,left leg in compression stocking  Some welling of left knee no redness NEURO:  Oriented x3, cranial nerves 3-12 appear to be intact, no obvious focal weakness,gait within normal limits no abnormal reflexes or asymmetrical SKIN: No acute rashes normal turgor, color, no bruising or petechiae. PSYCH: Oriented, good eye contact, no obvious depression anxiety, cognition and judgment appear normal. LN: no cervical axillary inguinal adenopathy daibetic foot   exam right  Eczema foot intact  pulse and sensation Lab Results  Component Value Date   WBC 8.0 08/23/2014   HGB 13.0  08/23/2014   HCT 39.5 08/23/2014   PLT 300.0 08/23/2014   GLUCOSE 152* 08/23/2014   CHOL 166 08/23/2014   TRIG 236.0* 08/23/2014   HDL 36.80* 08/23/2014   LDLDIRECT 95.1 08/23/2014   LDLCALC 91 04/18/2014   ALT 21 08/23/2014   AST 20 08/23/2014   NA 138 08/23/2014   K 4.4 08/23/2014   CL 104 08/23/2014   CREATININE 0.8 08/23/2014   BUN 13 08/23/2014   CO2 23 08/23/2014   TSH 1.80 08/23/2014   HGBA1C 6.9* 08/23/2014   MICROALBUR 0.6 08/23/2014   bp readings reviewed  160 168 146 range  Readings   ASSESSMENT AND PLAN:  Discussed the following assessment and plan:  Visit for preventive health examination - get a pap rom gyne   Diabetes mellitus,  - controlled   Essential hypertension - late bp inc  intensify rx add acei expectant managmentabout poss se and plan fu   Hyperlipidemia - tg elevated plan lsi stayon lipitor   Patient Care Team: Burnis Medin, MD as PCP - General Camillo Flaming, OD as Referring Physician (Optometry) Jiles Prows, MD as Attending Physician (General Practice) Rolm Bookbinder, MD as Consulting Physician (Dermatology) Elveria Royals, MD as Consulting Physician (Obstetrics and Gynecology) Norma Fredrickson, MD as Consulting Physician (Psychiatry) Mal Misty, MD as Consulting Physician (Vascular Surgery) Patient Instructions  Intensify lifestyle interventions. Dec sugars   a1c is good  Stay on lipitor at this time  Benefit is more than  risk . Need better blood pressure control Add on  Ace inhibitor class  Helps protect artery and kidneys in diabetes  also . Contact if gets a cough from this  And we can change meds  Get back with gyne check   For reasons discussed    Diabetes and Standards of Medical Care Diabetes is complicated. You may find that your diabetes team includes a dietitian, nurse, diabetes educator, eye doctor, and more. To  help everyone know what is going on and to help you get the care you deserve, the following schedule of care  was developed to help keep you on track. Below are the tests, exams, vaccines, medicines, education, and plans you will need. HbA1c test This test shows how well you have controlled your glucose over the past 2-3 months. It is used to see if your diabetes management plan needs to be adjusted.   It is performed at least 2 times a year if you are meeting treatment goals.  It is performed 4 times a year if therapy has changed or if you are not meeting treatment goals. Blood pressure test  This test is performed at every routine medical visit. The goal is less than 140/90 mm Hg for most people, but 130/80 mm Hg in some cases. Ask your health care provider about your goal. Dental exam  Follow up with the dentist regularly. Eye exam  If you are diagnosed with type 1 diabetes as a child, get an exam upon reaching the age of 60 years or older and have had diabetes for 3-5 years. Yearly eye exams are recommended after that initial eye exam.  If you are diagnosed with type 1 diabetes as an adult, get an exam within 5 years of diagnosis and then yearly.  If you are diagnosed with type 2 diabetes, get an exam as soon as possible after the diagnosis and then yearly. Foot care exam  Visual foot exams are performed at every routine medical visit. The exams check for cuts, injuries, or other problems with the feet.  A comprehensive foot exam should be done yearly. This includes visual inspection as well as assessing foot pulses and testing for loss of sensation.  Check your feet nightly for cuts, injuries, or other problems with your feet. Tell your health care provider if anything is not healing. Kidney function test (urine microalbumin)  This test is performed once a year.  Type 1 diabetes: The first test is performed 5 years after diagnosis.  Type 2 diabetes: The first test is performed at the time of diagnosis.  A serum creatinine and estimated glomerular filtration rate (eGFR) test is done  once a year to assess the level of chronic kidney disease (CKD), if present. Lipid profile (cholesterol, HDL, LDL, triglycerides)  Performed every 5 years for most people.  The goal for LDL is less than 100 mg/dL. If you are at high risk, the goal is less than 70 mg/dL.  The goal for HDL is 40 mg/dL-50 mg/dL for men and 50 mg/dL-60 mg/dL for women. An HDL cholesterol of 60 mg/dL or higher gives some protection against heart disease.  The goal for triglycerides is less than 150 mg/dL. Influenza vaccine, pneumococcal vaccine, and hepatitis B vaccine  The influenza vaccine is recommended yearly.  It is recommended that people with diabetes who are over 3 years old get the pneumonia vaccine. In some cases, two separate shots may be given. Ask your health care provider if your pneumonia vaccination is up to date.  The hepatitis B vaccine is also recommended for adults with diabetes. Diabetes self-management education  Education is recommended at diagnosis and ongoing as needed. Treatment plan  Your treatment plan is reviewed at every medical visit. Document Released: 07/27/2009 Document Revised: 02/13/2014 Document Reviewed: 03/01/2013 [facility] Patient Information 2015 [place], [place]. This information is not intended to replace advice given to you by your health care provider. Make sure you discuss any questions you have with  your health care provider.     Standley Brooking. Panosh M.D.

## 2014-09-15 NOTE — Patient Instructions (Addendum)
Intensify lifestyle interventions. Dec sugars   a1c is good  Stay on lipitor at this time  Benefit is more than  risk . Need better blood pressure control Add on  Ace inhibitor class  Helps protect artery and kidneys in diabetes  also . Contact if gets a cough from this  And we can change meds  Get back with gyne check   For reasons discussed    Diabetes and Standards of Medical Care Diabetes is complicated. You may find that your diabetes team includes a dietitian, nurse, diabetes educator, eye doctor, and more. To help everyone know what is going on and to help you get the care you deserve, the following schedule of care was developed to help keep you on track. Below are the tests, exams, vaccines, medicines, education, and plans you will need. HbA1c test This test shows how well you have controlled your glucose over the past 2-3 months. It is used to see if your diabetes management plan needs to be adjusted.   It is performed at least 2 times a year if you are meeting treatment goals.  It is performed 4 times a year if therapy has changed or if you are not meeting treatment goals. Blood pressure test  This test is performed at every routine medical visit. The goal is less than 140/90 mm Hg for most people, but 130/80 mm Hg in some cases. Ask your health care provider about your goal. Dental exam  Follow up with the dentist regularly. Eye exam  If you are diagnosed with type 1 diabetes as a child, get an exam upon reaching the age of 71 years or older and have had diabetes for 3-5 years. Yearly eye exams are recommended after that initial eye exam.  If you are diagnosed with type 1 diabetes as an adult, get an exam within 5 years of diagnosis and then yearly.  If you are diagnosed with type 2 diabetes, get an exam as soon as possible after the diagnosis and then yearly. Foot care exam  Visual foot exams are performed at every routine medical visit. The exams check for cuts,  injuries, or other problems with the feet.  A comprehensive foot exam should be done yearly. This includes visual inspection as well as assessing foot pulses and testing for loss of sensation.  Check your feet nightly for cuts, injuries, or other problems with your feet. Tell your health care provider if anything is not healing. Kidney function test (urine microalbumin)  This test is performed once a year.  Type 1 diabetes: The first test is performed 5 years after diagnosis.  Type 2 diabetes: The first test is performed at the time of diagnosis.  A serum creatinine and estimated glomerular filtration rate (eGFR) test is done once a year to assess the level of chronic kidney disease (CKD), if present. Lipid profile (cholesterol, HDL, LDL, triglycerides)  Performed every 5 years for most people.  The goal for LDL is less than 100 mg/dL. If you are at high risk, the goal is less than 70 mg/dL.  The goal for HDL is 40 mg/dL-50 mg/dL for men and 50 mg/dL-60 mg/dL for women. An HDL cholesterol of 60 mg/dL or higher gives some protection against heart disease.  The goal for triglycerides is less than 150 mg/dL. Influenza vaccine, pneumococcal vaccine, and hepatitis B vaccine  The influenza vaccine is recommended yearly.  It is recommended that people with diabetes who are over 23 years old get the pneumonia  vaccine. In some cases, two separate shots may be given. Ask your health care provider if your pneumonia vaccination is up to date.  The hepatitis B vaccine is also recommended for adults with diabetes. Diabetes self-management education  Education is recommended at diagnosis and ongoing as needed. Treatment plan  Your treatment plan is reviewed at every medical visit. Document Released: 07/27/2009 Document Revised: 02/13/2014 Document Reviewed: 03/01/2013 [facility] Patient Information 2015 [place], [place]. This information is not intended to replace advice given to you by your  health care provider. Make sure you discuss any questions you have with your health care provider.

## 2014-09-18 ENCOUNTER — Encounter: Payer: Self-pay | Admitting: Vascular Surgery

## 2014-09-19 ENCOUNTER — Ambulatory Visit (INDEPENDENT_AMBULATORY_CARE_PROVIDER_SITE_OTHER): Payer: 59 | Admitting: Vascular Surgery

## 2014-09-19 ENCOUNTER — Ambulatory Visit: Payer: PRIVATE HEALTH INSURANCE | Admitting: Vascular Surgery

## 2014-09-19 ENCOUNTER — Encounter: Payer: Self-pay | Admitting: Vascular Surgery

## 2014-09-19 VITALS — BP 125/100 | HR 82 | Resp 16 | Ht 68.0 in | Wt 194.0 lb

## 2014-09-19 DIAGNOSIS — I83892 Varicose veins of left lower extremities with other complications: Secondary | ICD-10-CM

## 2014-09-19 NOTE — Progress Notes (Signed)
Subjective:     Patient ID: Holly Beasley, female   DOB: 11-06-1950, 63 y.o.   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 She has tried wearing a long-leg elastic compression stockings 20-30 mm gradient and has also tried elevation ibuprofen with no improvement in her symptomatology. Continues to have aching throbbing burning discomfort and swelling in the left leg as the day progresses. She has no history of DVT. This is affecting her daily living. She also needs surgery on her left knee which will not be done until following resolution of the venous issues.  Past Medical History  Diagnosis Date  . Osteopenia     dexa -1.7 h -1.35   . Sleep apnea   . Scoliosis     leg length discrepancy  . Allergy   . Depression   . Osteoarthritis   . Skin cancer     basal cell nose  . History of ETOH abuse     recovering  in remission  . Hyperlipidemia   . Dry eyes     History  Substance Use Topics  . Smoking status: Never Smoker   . Smokeless tobacco: Never Used  . Alcohol Use: No     Comment: recovering    Family History  Problem Relation Age of Onset  . Arthritis Brother   . Alcohol abuse Brother   . Lymphoma Mother   . Diabetes Mother   . Hyperlipidemia Mother   . Heart failure Father   . Hyperlipidemia Father   . Alcohol abuse Brother     Allergies  Allergen Reactions  . Amoxicillin     REACTION: unspecified  . Cephalexin     REACTION: unspecified  . Sulfamethoxazole     REACTION: unspecified    Current outpatient prescriptions: atorvastatin (LIPITOR) 40 MG tablet, Take 40 mg by mouth daily. , Disp: , Rfl: 3;  CALCIUM PO, Take 500 mg by mouth 2 (two) times daily., Disp: , Rfl: ;  carvedilol (COREG) 12.5 MG tablet, Take 12.5 mg by mouth 2 (two) times daily with a meal. , Disp: , Rfl: 0;  Cholecalciferol (VITAMIN D PO), Take 500 Units by mouth daily., Disp: , Rfl:  ciclesonide (OMNARIS)  50 MCG/ACT nasal spray, Place 2 sprays into both nostrils daily., Disp: , Rfl: ;  cycloSPORINE (RESTASIS) 0.05 % ophthalmic emulsion, Place 1 drop into both eyes at bedtime., Disp: , Rfl: ;  DULoxetine (CYMBALTA) 60 MG capsule, Take 120 mg by mouth at bedtime. , Disp: , Rfl: ;  lisinopril (PRINIVIL,ZESTRIL) 10 MG tablet, Take 1 tablet (10 mg total) by mouth daily., Disp: 90 tablet, Rfl: 1 metFORMIN (GLUCOPHAGE) 500 MG tablet, Take 3-4 tablets daily with a meal, Disp: , Rfl: ;  montelukast (SINGULAIR) 10 MG tablet, Take 10 mg by mouth at bedtime., Disp: , Rfl: 0;  MULTIPLE VITAMIN PO, Take 1 tablet by mouth daily., Disp: , Rfl: ;  Omega-3 Fatty Acids (FISH OIL) 1000 MG CAPS, Take 1 capsule by mouth daily., Disp: , Rfl: ;  omeprazole (PRILOSEC) 40 MG capsule, Take 40 mg by mouth daily., Disp: , Rfl: 0  BP 125/100 mmHg  Pulse 82  Resp 16  Ht 5\' 8"  (1.727 m)  Wt 194 lb (87.998 kg)  BMI 29.50 kg/m2  Body mass index is 29.5 kg/(m^2).           Review of Systems denies chest pain, dyspnea on exertion, PND, orthopnea, hemoptysis  Objective:   Physical Exam BP 125/100 mmHg  Pulse 82  Resp 16  Ht 5\' 8"  (1.727 m)  Wt 194 lb (87.998 kg)  BMI 29.50 kg/m2  Gen. well-developed well-nourished female no apparent distress alert and oriented 3 Lungs no rhonchi or wheezing Left leg with bulging varicosities in the medial calf over the great saphenous system extending posteriorly with 1+ distal edema. 3+ dorsalis pedis pulse palpable.  Patient has documented gross reflux throughout left great saphenous systems applying these bulging varicosities with no DVT or deep venous reflux.     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 We'll proceed  with recertification to perform this in the near future and will leave her symptoms so that she can then proceed with her left knee surgery.

## 2014-09-20 ENCOUNTER — Ambulatory Visit (INDEPENDENT_AMBULATORY_CARE_PROVIDER_SITE_OTHER): Payer: 59 | Admitting: Sports Medicine

## 2014-09-20 ENCOUNTER — Encounter: Payer: Self-pay | Admitting: Sports Medicine

## 2014-09-20 ENCOUNTER — Other Ambulatory Visit: Payer: Self-pay | Admitting: *Deleted

## 2014-09-20 VITALS — BP 135/77 | HR 82 | Ht 68.0 in | Wt 194.0 lb

## 2014-09-20 DIAGNOSIS — S83207A Unspecified tear of unspecified meniscus, current injury, left knee, initial encounter: Secondary | ICD-10-CM

## 2014-09-20 DIAGNOSIS — M25562 Pain in left knee: Secondary | ICD-10-CM

## 2014-09-20 DIAGNOSIS — M1712 Unilateral primary osteoarthritis, left knee: Secondary | ICD-10-CM

## 2014-09-20 DIAGNOSIS — I83892 Varicose veins of left lower extremities with other complications: Secondary | ICD-10-CM

## 2014-09-20 MED ORDER — TRAMADOL HCL 50 MG PO TABS: 50.0000 mg | ORAL_TABLET | Freq: Four times a day (QID) | ORAL | Status: DC | PRN

## 2014-09-21 DIAGNOSIS — S83209A Unspecified tear of unspecified meniscus, current injury, unspecified knee, initial encounter: Secondary | ICD-10-CM | POA: Insufficient documentation

## 2014-09-21 DIAGNOSIS — M25562 Pain in left knee: Secondary | ICD-10-CM | POA: Insufficient documentation

## 2014-09-21 NOTE — Progress Notes (Signed)
Patient ID: Holly Beasley, female   DOB: [birth year], 63 y.o.   MRN: [id number]  Subjective: Patient presents with left knee pain. Pain has been intermittent in this knee for years but worsened several months ago.  At time of worsening pain, she also noticed significant swelling. She states pain is achy and mainly localized to lateral knee. It is worse with stairs and standing from a seated position. This knee would give out on her previously but she has had locking in this knee since August.  She also says she has a palpable nodule in her anterior knee. She takes some of her husband's Hydrocodone to help with the pain. She was seen by orthopedist at [facility] who performed an MRI showing a lateral meniscus tear and associated lateral compartmental OA with reactive bony changes in lateral femoral epicondyle and lateral tibial condyle. No numbness, tingling, or LE weakness.  She works with a Physiological scientist who advised her to come here for additional recommendations.  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  She wears compression stockings.  Objective: BP 135/77 mmHg  Pulse 82  Ht 5\' 8"  (1.727 m)  Wt 194 lb (87.998 kg)  BMI 29.50 kg/m2 General: calm, cooperative, NAD HEENT: conj clear, sclera anicteric, Crisp/AT Respiratory: breathing non-labored Cardiovascular: LE pulses intact and equal bilaterally MSK: overweight Left Knee ROM: flexion to 130 degrees; lacks 10 degrees of extension Palpation: lateral>medial JLT; warm to palpation indicative of effusion Inspection: no leg length discrepancy; valgus deformity of knee  Moderate effusion No laxity on anterior/posterior drawer or Lachmann's No laxity on varus/valgus stress Pain without click on lateral McMurray's testing No patellar or patellar tendon tenderness Feet: bilateral  pes planus and slight overpronation  Left>right LE pitting edema   Assessment/Plan: Patient is a 63 y/o F with right knee pain secondary to osteoarthritis.  1. Left Knee Pain 2.  Lateral Mensical Tear, left 3. Knee Osteoarthritis 4.  Knee Effusion, left  Potential treatment options were discussed with patient. She will continue to consider surgery in light of locking/catching she has been experiencing since August; MRI reviewed showing lateral meniscus tear. Will try compression knee sleeve to help alleviate effusion and provide support; if this is ineffective, consider aspiration. Consider injection in the future if pain worsens. Patient to continue biking and thigh/hip strengthening.  She was given medial arch support today (placed on pre-existing insoles) given valgus deformity to try and offload some pressure from her lateral compartment. Will try Tramadol to help with her knee pain. Follow-up in 4 weeks.  Patient seen and discussed with Dr. Oneida Alar.  Creig Hines PGY-3 Family Medicine  Agree with plan   Stefanie Libel, MD

## 2014-09-22 NOTE — Assessment & Plan Note (Signed)
There is significant change on Exam and MRI  We will try conservative care and NWB strength work  Compression sleeve

## 2014-09-22 NOTE — Assessment & Plan Note (Signed)
She has signifigant OA so whether surgery would help is equivocal I suggested trying a longer period of conservative care and if the mechanical sxs stop she would not seek surgical care

## 2014-10-05 ENCOUNTER — Encounter: Payer: Self-pay | Admitting: Vascular Surgery

## 2014-10-09 ENCOUNTER — Encounter: Payer: Self-pay | Admitting: Vascular Surgery

## 2014-10-09 ENCOUNTER — Ambulatory Visit (INDEPENDENT_AMBULATORY_CARE_PROVIDER_SITE_OTHER): Payer: 59 | Admitting: Vascular Surgery

## 2014-10-09 VITALS — BP 154/76 | HR 75 | Resp 16 | Ht 68.0 in | Wt 194.0 lb

## 2014-10-09 DIAGNOSIS — I83892 Varicose veins of left lower extremities with other complications: Secondary | ICD-10-CM

## 2014-10-09 DIAGNOSIS — I83899 Varicose veins of unspecified lower extremities with other complications: Secondary | ICD-10-CM | POA: Insufficient documentation

## 2014-10-09 NOTE — Progress Notes (Signed)
Subjective:     Patient ID: Holly Beasley, female   DOB: 1951/06/03, 63 y.o.   MRN: [id number]  HPI this 63 year old female had laser ablation of the left great saphenous vein from the distal thigh to near the saphenofemoral junction plus multiple stab phlebectomy of painful varicosities performed under local tumescent anesthesia. She tolerated the procedure well. A total of 1860 J of energy was utilized.   Review of Systems     Objective:   Physical Exam BP 154/76 mmHg  Pulse 75  Resp 16  Ht 5\' 8"  (1.727 m)  Wt 194 lb (87.998 kg)  BMI 29.50 kg/m2       Assessment:     Well-tolerated laser ablation left great saphenous vein plus multiple stab phlebectomy of painful varicosities    Plan:     Return in one week for venous duplex exam to confirm closure left great saphenous vein and this will complete patient's treatment regimen

## 2014-10-09 NOTE — Progress Notes (Signed)
     Laser Ablation Procedure    Date: 10/09/2014   Holly Beasley DOB:Jan 25, 1951  Consent signed: Yes    Surgeon:  Dr. Sherren Mocha Early  Procedure: Laser Ablation: left Greater Saphenous Vein  BP 154/76 mmHg  Pulse 75  Resp 16  Ht 5\' 8"  (1.727 m)  Wt 194 lb (87.998 kg)  BMI 29.50 kg/m2  Tumescent Anesthesia: 400 cc 0.9% NaCl with 50 cc Lidocaine HCL with 1% Epi and 15 cc 8.4% NaHCO3  Local Anesthesia: 8 cc Lidocaine HCL and NaHCO3 (ratio 2:1)  Start: 11:10 am   Stop: 12:05  Pulsed mode: 15 watts, 539ms delay, 1.0 duration  Total energy: 1856, total pulses: 124, total time: 2:04  Stab Phlebectomy: 10-20 Sites: Calf  Patient tolerated procedure well  Notes:   Description of Procedure:  After marking the course of the secondary varicosities, the patient was placed on the operating table in the supine position, and the left leg was prepped and draped in sterile fashion.   Local anesthetic was administered and under ultrasound guidance the saphenous vein was accessed with a micro needle and guide wire; then the mirco puncture sheath was placed.  A guide wire was inserted saphenofemoral junction , followed by a 5 french sheath.  The position of the sheath and then the laser fiber below the junction was confirmed using the ultrasound.  Tumescent anesthesia was administered along the course of the saphenous vein using ultrasound guidance. The patient was placed in Trendelenburg position and protective laser glasses were placed on patient and staff, and the laser was fired at 15 watts continuous mode advancing 1-37mm/second for a total of 1856 joules.   For stab phlebectomies, local anesthetic was administered at the previously marked varicosities, and tumescent anesthesia was administered around the vessels.  Ten to 20 stab wounds were made using the tip of an 11 blade. And using the vein hook, the phlebectomies were performed using a hemostat to avulse the varicosities.  Adequate  hemostasis was achieved.     Steri strips were applied to the stab wounds and ABD pads and thigh high compression stockings were applied.  Ace wrap bandages were applied over the phlebectomy sites and at the top of the saphenofemoral junction. Blood loss was less than 15 cc.  The patient ambulated out of the operating room having tolerated the procedure well.

## 2014-10-10 ENCOUNTER — Encounter: Payer: Self-pay | Admitting: Vascular Surgery

## 2014-10-16 ENCOUNTER — Encounter: Payer: Self-pay | Admitting: Vascular Surgery

## 2014-10-16 ENCOUNTER — Ambulatory Visit (HOSPITAL_COMMUNITY)
Admission: RE | Admit: 2014-10-16 | Discharge: 2014-10-16 | Disposition: A | Discharge status: Home / Self Care | Payer: 59 | Source: Ambulatory Visit | Attending: Vascular Surgery | Admitting: Vascular Surgery

## 2014-10-16 ENCOUNTER — Ambulatory Visit (INDEPENDENT_AMBULATORY_CARE_PROVIDER_SITE_OTHER): Payer: Self-pay | Admitting: Vascular Surgery

## 2014-10-16 VITALS — BP 156/85 | HR 68 | Resp 16 | Ht 68.0 in | Wt 194.0 lb

## 2014-10-16 DIAGNOSIS — I83892 Varicose veins of left lower extremities with other complications: Secondary | ICD-10-CM

## 2014-10-16 NOTE — Progress Notes (Signed)
Subjective:     Patient ID: Holly Beasley, female   DOB: 02-27-1951, 64 y.o.   MRN: [id number]  HPI this 64 year old female returns 1 week post laser ablation left great saphenous vein for gross reflux with painful varicosities and edema. She has worn her elastic compression stocking and taken ibuprofen as instructed. She has had mild to moderate discomfort along the course of the great saphenous vein in the mid to proximal thigh which is resolving.  Past Medical History  Diagnosis Date  . Osteopenia     dexa -1.7 h -1.35   . Sleep apnea   . Scoliosis     leg length discrepancy  . Allergy   . Depression   . Osteoarthritis   . Skin cancer     basal cell nose  . History of ETOH abuse     recovering  in remission  . Hyperlipidemia   . Dry eyes   . Varicose veins     History  Substance Use Topics  . Smoking status: Never Smoker   . Smokeless tobacco: Never Used  . Alcohol Use: No     Comment: recovering    Family History  Problem Relation Age of Onset  . Arthritis Brother   . Alcohol abuse Brother   . Lymphoma Mother   . Diabetes Mother   . Hyperlipidemia Mother   . Heart failure Father   . Hyperlipidemia Father   . Alcohol abuse Brother     Allergies  Allergen Reactions  . Amoxicillin     REACTION: unspecified  . Cephalexin     REACTION: unspecified  . Sulfamethoxazole     REACTION: unspecified    Current outpatient prescriptions: atorvastatin (LIPITOR) 40 MG tablet, Take 40 mg by mouth daily. , Disp: , Rfl: 3;  CALCIUM PO, Take 500 mg by mouth 2 (two) times daily., Disp: , Rfl: ;  carvedilol (COREG) 12.5 MG tablet, Take 12.5 mg by mouth 2 (two) times daily with a meal. , Disp: , Rfl: 0;  Cholecalciferol (VITAMIN D PO), Take 500 Units by mouth daily., Disp: , Rfl:  ciclesonide (OMNARIS) 50 MCG/ACT nasal spray, Place 2 sprays into both nostrils daily., Disp: , Rfl: ;  cycloSPORINE (RESTASIS) 0.05 % ophthalmic emulsion, Place 1 drop into both eyes at bedtime.,  Disp: , Rfl: ;  DULoxetine (CYMBALTA) 60 MG capsule, Take 120 mg by mouth at bedtime. , Disp: , Rfl: ;  lisinopril (PRINIVIL,ZESTRIL) 10 MG tablet, Take 1 tablet (10 mg total) by mouth daily., Disp: 90 tablet, Rfl: 1 metFORMIN (GLUCOPHAGE) 500 MG tablet, Take 3-4 tablets daily with a meal, Disp: , Rfl: ;  montelukast (SINGULAIR) 10 MG tablet, Take 10 mg by mouth at bedtime., Disp: , Rfl: 0;  MULTIPLE VITAMIN PO, Take 1 tablet by mouth daily., Disp: , Rfl: ;  Omega-3 Fatty Acids (FISH OIL) 1000 MG CAPS, Take 1 capsule by mouth daily., Disp: , Rfl: ;  omeprazole (PRILOSEC) 40 MG capsule, Take 40 mg by mouth daily., Disp: , Rfl: 0 traMADol (ULTRAM) 50 MG tablet, Take 1 tablet (50 mg total) by mouth every 6 (six) hours as needed., Disp: 90 tablet, Rfl: 3  BP 156/85 mmHg  Pulse 68  Resp 16  Ht 5\' 8"  (1.727 m)  Wt 194 lb (87.998 kg)  BMI 29.50 kg/m2  Body mass index is 29.5 kg/(m^2).           Review of Systems denies chest pain, dyspnea on exertion, PND, orthopnea, hemoptysis.  Objective:   Physical Exam BP 156/85 mmHg  Pulse 68  Resp 16  Ht 5\' 8"  (1.727 m)  Wt 194 lb (87.998 kg)  BMI 29.50 kg/m2  Gen. well-developed well-nourished female no apparent stress alert and oriented 3 Lungs no rhonchi or wheezing Left leg with mild ecchymosis medial thigh. Mild tenderness to palpation. 1+ distal edema. 3+ dorsalis pedis pulse palpable. Stab phlebectomy sites healing nicely.  Today I ordered a venous duplex exam of the left eye reviewed and interpreted. There is no DVT. There is total closure of the great saphenous vein from the knee to near the saphenofemoral junction.     Assessment:     Successful laser ablation left great saphenous vein for gross reflux with painful varicosities and edema    Plan:     Return to see me on when necessary basis

## 2014-10-17 ENCOUNTER — Telehealth: Payer: Self-pay | Admitting: Internal Medicine

## 2014-10-17 NOTE — Telephone Encounter (Signed)
Stop the lisinopril Begin losartan 50 mg 1 by mouth daily dispensed 30 refill 3 If this is not on her formulary check to see what ARB is preferred and we can prescribe that

## 2014-10-17 NOTE — Telephone Encounter (Signed)
Patient states Lisinopril is causing cough.  She would like know if you can prescribe a different medication??  Patient would like a callback to discuss alternative.  [id number] - [place],  - [place]. AT [address]

## 2014-10-17 NOTE — Telephone Encounter (Signed)
LMOM for the pt to return my call.

## 2014-10-18 MED ORDER — LOSARTAN POTASSIUM 50 MG PO TABS: 50.0000 mg | ORAL_TABLET | Freq: Every day | ORAL | Status: DC

## 2014-10-18 NOTE — Telephone Encounter (Signed)
Pt notified to stop the lisinopril and start losartan 50 mg 1 po qd.  Sent to the pharmacy by e-scribe.

## 2014-10-22 ENCOUNTER — Other Ambulatory Visit: Payer: Self-pay | Admitting: Internal Medicine

## 2014-10-25 ENCOUNTER — Encounter: Payer: Self-pay | Admitting: Sports Medicine

## 2014-10-25 ENCOUNTER — Ambulatory Visit (INDEPENDENT_AMBULATORY_CARE_PROVIDER_SITE_OTHER): Payer: 59 | Admitting: Sports Medicine

## 2014-10-25 VITALS — BP 154/89 | Ht 68.0 in | Wt 190.0 lb

## 2014-10-25 DIAGNOSIS — S83207A Unspecified tear of unspecified meniscus, current injury, left knee, initial encounter: Secondary | ICD-10-CM

## 2014-10-25 NOTE — Assessment & Plan Note (Signed)
-  Left Lateral Knee Pain with lateral meniscal tear, refractory to conservative management -Known medial compartment DJD, but mechanical locking concerning for significant meniscal tear or loose body -Appt with Dr. Rhona Raider for consideration for left knee arthoscopy  In the meantime, the following exercises will strengthen the leg to make recovery much faster from any subsequent procedures: 1. Straight leg raises, laying on your back, lift your knee up and bring down. Do 10x 3 repeats 2. Laying on your right side, lift left leg up with straight knee, bring down. Do 10x 3 repeats.  Follow-up with Dr. Rhona Raider at [place] and then with [place] after if needed.

## 2014-10-25 NOTE — Patient Instructions (Signed)
-  Left Lateral Knee Pain with lateral meniscal tear, refractory to conservative management -Known medial compartment DJD, but mechanical locking concerning for significant meniscal tear or loose body -Appt with Dr. Rhona Raider for consideration for left knee arthoscopy  In the meantime, the following exercises will strengthen the leg to make recovery much faster from any subsequent procedures: 1. Straight leg raises, laying on your back, lift your knee up and bring down. Do 10x 3 repeats 2. Laying on your right side, lift left leg up with straight knee, bring down. Do 10x 3 repeats.  Follow-up with Dr. Rhona Raider at [place] and then with [place] after if needed.

## 2014-10-25 NOTE — Progress Notes (Signed)
   Subjective:    Patient ID: Holly Beasley, female    DOB: [birth year], 64 y.o.   MRN: [id number]  HPI Holly Beasley is a 64 -year-old female who presents for follow-up of left knee pain. Pain has been intermittent in this knee for years but worsened several months ago. At time of worsening pain, she also noticed significant swelling. She states pain is achy and mainly localized to lateral knee. It is worse with stairs and standing from a seated position. This knee would give out on her previously but she has had locking in this knee since August. She was seen by orthopedist at [facility] who performed an MRI showing a lateral meniscus tear and associated lateral compartmental OA with reactive bony changes in lateral femoral epicondyle and lateral tibial condyle. No numbness, tingling, or LE weakness.   She also has issues with varicose veins in this leg, and has had recent surgery, which was a success. She wears compression stockings.  Today, she notices that the left knee pain has persisted. She still notes mechanical locking approximately 5-6 times daily. The lateral knee pain persists. She has tried wearing a body helix compression sleeve, but in light of her recent surgery had to wear more compression stockings instead. Her left knee pain has significantly interfered with her function, and will wake her up at night.  Please see problem based assessment and plan in the problem list.  Review of Systems 7 point review of systems was performed and was otherwise negative unless noted in the history of present illness.     Objective:   Physical Exam BP 154/89 mmHg  Ht 5\' 8"  (1.727 m)  Wt 190 lb (86.183 kg)  BMI 28.90 kg/m2 GEN: The patient is well-developed well-nourished female and in no acute distress.  She is awake alert and oriented x3. SKIN: warm and well-perfused, no rash  EXTR: No lower extremity edema or calf tenderness Neuro: Strength 5/5 globally. Sensation intact  throughout. DTRs 2/4 bilaterally. No focal deficits. Vasc: +2 bilateral distal pulses. No edema.  MSK: Examination of the left knee reveals mild to moderate effusion. Range of motion is from 10-130 with pain at the endpoint of motion. She lacks 10-15 of full extension. She has tenderness palpation over the lateral joint line. Her knee position is valgus. No laxity with valgus or varus testing. She has a painful clicking with McMurray's. Negative patellar grind test. She also has bilateral pes planus with hyperpronation of the bilateral ankles. Mild diffuse left lower extremity pitting edema, though this is seemingly improved compared to prior exam.     Assessment & Plan:  Please see problem based assessment and plan in the problem list.

## 2014-10-27 ENCOUNTER — Other Ambulatory Visit: Payer: Self-pay | Admitting: Internal Medicine

## 2014-10-30 NOTE — Telephone Encounter (Signed)
Sent to the pharmacy by e-scribe. 

## 2014-11-08 ENCOUNTER — Encounter (HOSPITAL_BASED_OUTPATIENT_CLINIC_OR_DEPARTMENT_OTHER): Payer: Self-pay | Admitting: *Deleted

## 2014-11-08 ENCOUNTER — Other Ambulatory Visit: Payer: Self-pay

## 2014-11-08 ENCOUNTER — Other Ambulatory Visit: Payer: Self-pay | Admitting: Orthopaedic Surgery

## 2014-11-08 ENCOUNTER — Encounter (HOSPITAL_BASED_OUTPATIENT_CLINIC_OR_DEPARTMENT_OTHER)
Admission: RE | Admit: 2014-11-08 | Discharge: 2014-11-08 | Disposition: A | Discharge status: Home / Self Care | Payer: 59 | Source: Ambulatory Visit | Attending: Orthopaedic Surgery | Admitting: Orthopaedic Surgery

## 2014-11-08 DIAGNOSIS — Z881 Allergy status to other antibiotic agents status: Secondary | ICD-10-CM | POA: Diagnosis not present

## 2014-11-08 DIAGNOSIS — E785 Hyperlipidemia, unspecified: Secondary | ICD-10-CM | POA: Diagnosis not present

## 2014-11-08 DIAGNOSIS — M25562 Pain in left knee: Secondary | ICD-10-CM | POA: Diagnosis present

## 2014-11-08 DIAGNOSIS — M23201 Derangement of unspecified lateral meniscus due to old tear or injury, left knee: Secondary | ICD-10-CM | POA: Diagnosis not present

## 2014-11-08 DIAGNOSIS — M1712 Unilateral primary osteoarthritis, left knee: Secondary | ICD-10-CM | POA: Diagnosis not present

## 2014-11-08 DIAGNOSIS — Z85828 Personal history of other malignant neoplasm of skin: Secondary | ICD-10-CM | POA: Diagnosis not present

## 2014-11-08 DIAGNOSIS — Z882 Allergy status to sulfonamides status: Secondary | ICD-10-CM | POA: Diagnosis not present

## 2014-11-08 DIAGNOSIS — K219 Gastro-esophageal reflux disease without esophagitis: Secondary | ICD-10-CM | POA: Diagnosis not present

## 2014-11-08 DIAGNOSIS — M23204 Derangement of unspecified medial meniscus due to old tear or injury, left knee: Secondary | ICD-10-CM | POA: Diagnosis not present

## 2014-11-08 DIAGNOSIS — M8580 Other specified disorders of bone density and structure, unspecified site: Secondary | ICD-10-CM | POA: Diagnosis not present

## 2014-11-08 DIAGNOSIS — F329 Major depressive disorder, single episode, unspecified: Secondary | ICD-10-CM | POA: Diagnosis not present

## 2014-11-08 DIAGNOSIS — I10 Essential (primary) hypertension: Secondary | ICD-10-CM | POA: Diagnosis not present

## 2014-11-08 LAB — BASIC METABOLIC PANEL
ANION GAP: 8 (ref 5–15)
BUN: 11 mg/dL (ref 6–23)
CALCIUM: 9.5 mg/dL (ref 8.4–10.5)
CHLORIDE: 104 mmol/L (ref 96–112)
CO2: 27 mmol/L (ref 19–32)
Creatinine, Ser: 0.74 mg/dL (ref 0.50–1.10)
GFR calc Af Amer: >90 mL/min (ref >90)
GFR calc non Af Amer: 89 mL/min — ABNORMAL LOW (ref >90)
GLUCOSE: 160 mg/dL — AB (ref 70–99)
Potassium: 4.6 mmol/L (ref 3.5–5.1)
SODIUM: 139 mmol/L (ref 135–145)

## 2014-11-08 NOTE — Progress Notes (Signed)
Pt came in-ekg-bmet t done- Uses a mouth piece for snoring-did not need a cpap

## 2014-11-08 NOTE — H&P (Signed)
Holly Beasley is an 64 y.o. female.   Chief Complaint: left knee pain HPI: Holly Beasley is a 64 year old retired woman who is here in referral through Dr. Oneida Alar and also her friends, the [facility].  She would basically like a knee arthroscopy.  She has had more than a year of knee difficulty.  Her greatest problem is mechanical in that it will catch and lock on her.  She has a great deal of difficulty on stairs.  She's received excellent workup through Dr. Mardelle Matte and through Dr. Oneida Alar as well.  She's had an injection which helped her for about a week.  She used a brace and Dr. Oneida Alar made some shoe inserts.  She's tried some pills as well.  Her pain is intermittent and severe.  She thinks the problem is getting worse.  She had some vein work done on her left leg late last year and has been released by Dr. Kellie Simmering at this point.    MRI:  I reviewed an MRI scan films and report of a study done at the [facility] on 08/04/14.  This shows a lateral meniscus tear with some moderate degenerative changes.  Past Medical History  Diagnosis Date  . Osteopenia     dexa -1.7 h -1.35   . Scoliosis     leg length discrepancy  . Allergy   . Depression   . Osteoarthritis   . Skin cancer     basal cell nose  . History of ETOH abuse     recovering  in remission  . Hyperlipidemia   . Dry eyes   . Varicose veins   . Hypertension   . Sleep apnea     did not meet criteria for cpap-uses a mouthpiece  . GERD (gastroesophageal reflux disease)   . Wears contact lenses     Past Surgical History  Procedure Laterality Date  . Colonoscopy w/ polypectomy      precancerous   2004  . Tonsillectomy and adenoidectomy  1967  . Nasal sinus surgery  11-13-2008    polyps removed  . Skin cancer removal      basal cell face  . Breast biopsy      2003-rt    Family History  Problem Relation Age of Onset  . Arthritis Brother   . Alcohol abuse Brother   . Lymphoma Mother   . Diabetes Mother   . Hyperlipidemia Mother    . Heart failure Father   . Hyperlipidemia Father   . Alcohol abuse Brother    Social History:  reports that she has never smoked. She has never used smokeless tobacco. She reports that she does not drink alcohol or use illicit drugs.  Allergies:  Allergies  Allergen Reactions  . Amoxicillin     REACTION: unspecified  . Cephalexin     REACTION: unspecified  . Sulfamethoxazole     REACTION: unspecified    No prescriptions prior to admission    No results found for this or any previous visit (from the past 48 hour(s)). No results found.  Review of Systems  Musculoskeletal: Positive for joint pain.       Left knee  All other systems reviewed and are negative.   Height 5\' 8"  (1.727 m), weight 86.183 kg (190 lb). Physical Exam  Constitutional: She is oriented to person, place, and time. She appears well-developed and well-nourished.  HENT:  Head: Normocephalic and atraumatic.  Eyes: Conjunctivae are normal. Pupils are equal, round, and reactive to light.  Neck: Normal range of motion.  Cardiovascular: Normal rate.   Respiratory: Effort normal.  GI: Soft.  Musculoskeletal:  Left knee has trace effusion.  She does have 1+ pitting edema of this lower leg with no edema on the opposite leg.  She has intense lateral joint line pain and a McMurray's says which is positive for pain in that direction.  Ligaments feel stable.  There is some mild crepitation towards full extension.   Hip motion is full and painfree and SLR is negative on both sides.  There is no palpable LAD behind either knee.  Sensation and motor function are intact on both sides and there are palpable pulses on both sides.    Neurological: She is alert and oriented to person, place, and time.  Skin: Skin is warm and dry.  Psychiatric: She has a normal mood and affect. Her behavior is normal. Judgment and thought content normal.     Assessment/Plan Assessment:  Left knee torn lateral meniscus and moderate DJD by  MRI  Plan: I would agree with the other doctors that Holly Beasley is in need of a knee arthroscopy.  I told her that Dr. Mardelle Matte has done an excellent job working her up and would do an excellent job with her surgery but she has decided that she would like to have it done here.  I reviewed risk of anesthesia and infection related to this intervention.  I also stressed this will make her knee better but certainly not new.  I told her that at some point she might need a knee replacement but hopefully that will be many years down the road.  I stressed the importance of some postoperative rehabilitation to optimize her result.   Sammy Cassar, Larwance Sachs 11/08/2014, 12:17 PM

## 2014-11-09 ENCOUNTER — Encounter (HOSPITAL_BASED_OUTPATIENT_CLINIC_OR_DEPARTMENT_OTHER): Payer: Self-pay | Admitting: *Deleted

## 2014-11-10 ENCOUNTER — Encounter (HOSPITAL_BASED_OUTPATIENT_CLINIC_OR_DEPARTMENT_OTHER)
Admission: RE | Disposition: A | Discharge status: Home / Self Care | Payer: Self-pay | Source: Ambulatory Visit | Attending: Orthopaedic Surgery

## 2014-11-10 ENCOUNTER — Ambulatory Visit (HOSPITAL_BASED_OUTPATIENT_CLINIC_OR_DEPARTMENT_OTHER): Payer: 59 | Admitting: Anesthesiology

## 2014-11-10 ENCOUNTER — Ambulatory Visit (HOSPITAL_BASED_OUTPATIENT_CLINIC_OR_DEPARTMENT_OTHER)
Admission: RE | Admit: 2014-11-10 | Discharge: 2014-11-10 | Disposition: A | Discharge status: Home / Self Care | Payer: 59 | Source: Ambulatory Visit | Attending: Orthopaedic Surgery | Admitting: Orthopaedic Surgery

## 2014-11-10 ENCOUNTER — Encounter (HOSPITAL_BASED_OUTPATIENT_CLINIC_OR_DEPARTMENT_OTHER): Payer: Self-pay | Admitting: Anesthesiology

## 2014-11-10 DIAGNOSIS — I10 Essential (primary) hypertension: Secondary | ICD-10-CM | POA: Insufficient documentation

## 2014-11-10 DIAGNOSIS — M23201 Derangement of unspecified lateral meniscus due to old tear or injury, left knee: Secondary | ICD-10-CM | POA: Diagnosis not present

## 2014-11-10 DIAGNOSIS — F329 Major depressive disorder, single episode, unspecified: Secondary | ICD-10-CM | POA: Insufficient documentation

## 2014-11-10 DIAGNOSIS — E785 Hyperlipidemia, unspecified: Secondary | ICD-10-CM | POA: Insufficient documentation

## 2014-11-10 DIAGNOSIS — Z881 Allergy status to other antibiotic agents status: Secondary | ICD-10-CM | POA: Insufficient documentation

## 2014-11-10 DIAGNOSIS — Z882 Allergy status to sulfonamides status: Secondary | ICD-10-CM | POA: Insufficient documentation

## 2014-11-10 DIAGNOSIS — M1712 Unilateral primary osteoarthritis, left knee: Secondary | ICD-10-CM | POA: Insufficient documentation

## 2014-11-10 DIAGNOSIS — M23204 Derangement of unspecified medial meniscus due to old tear or injury, left knee: Secondary | ICD-10-CM | POA: Insufficient documentation

## 2014-11-10 DIAGNOSIS — M8580 Other specified disorders of bone density and structure, unspecified site: Secondary | ICD-10-CM | POA: Insufficient documentation

## 2014-11-10 DIAGNOSIS — K219 Gastro-esophageal reflux disease without esophagitis: Secondary | ICD-10-CM | POA: Insufficient documentation

## 2014-11-10 DIAGNOSIS — Z85828 Personal history of other malignant neoplasm of skin: Secondary | ICD-10-CM | POA: Insufficient documentation

## 2014-11-10 HISTORY — PX: KNEE ARTHROSCOPY WITH LATERAL MENISECTOMY: SHX6193

## 2014-11-10 HISTORY — DX: Gastro-esophageal reflux disease without esophagitis: K21.9

## 2014-11-10 HISTORY — DX: Essential (primary) hypertension: I10

## 2014-11-10 HISTORY — DX: Presence of spectacles and contact lenses: Z97.3

## 2014-11-10 LAB — POCT HEMOGLOBIN-HEMACUE: Hemoglobin: 15.4 g/dL — ABNORMAL HIGH (ref 12.0–15.0)

## 2014-11-10 LAB — GLUCOSE, CAPILLARY
Glucose-Capillary: 137 mg/dL — ABNORMAL HIGH (ref 70–99)
Glucose-Capillary: 139 mg/dL — ABNORMAL HIGH (ref 70–99)

## 2014-11-10 SURGERY — ARTHROSCOPY, KNEE, WITH LATERAL MENISCECTOMY
Anesthesia: General | Site: Knee | Laterality: Left

## 2014-11-10 MED ORDER — FENTANYL CITRATE 0.05 MG/ML IJ SOLN: 50.0000 ug | INTRAMUSCULAR | Status: DC | PRN

## 2014-11-10 MED ORDER — PROPOFOL 10 MG/ML IV BOLUS
INTRAVENOUS | Status: DC | PRN
  Administered 2014-11-10: 200 mg via INTRAVENOUS

## 2014-11-10 MED ORDER — CHLORHEXIDINE GLUCONATE 4 % EX LIQD: 60.0000 mL | Freq: Once | CUTANEOUS | Status: DC

## 2014-11-10 MED ORDER — VANCOMYCIN HCL IN DEXTROSE 1-5 GM/200ML-% IV SOLN
INTRAVENOUS | Status: AC
  Filled 2014-11-10: qty 200

## 2014-11-10 MED ORDER — HYDROMORPHONE HCL 1 MG/ML IJ SOLN
0.2500 mg | INTRAMUSCULAR | Status: DC | PRN
  Administered 2014-11-10: 0.5 mg via INTRAVENOUS
  Administered 2014-11-10 (×2): 0.25 mg via INTRAVENOUS

## 2014-11-10 MED ORDER — MORPHINE SULFATE 4 MG/ML IJ SOLN
INTRAMUSCULAR | Status: AC
  Filled 2014-11-10: qty 1

## 2014-11-10 MED ORDER — DEXAMETHASONE SODIUM PHOSPHATE 4 MG/ML IJ SOLN
INTRAMUSCULAR | Status: DC | PRN
  Administered 2014-11-10: 10 mg via INTRAVENOUS

## 2014-11-10 MED ORDER — METHYLPREDNISOLONE ACETATE 80 MG/ML IJ SUSP
INTRAMUSCULAR | Status: AC
  Filled 2014-11-10: qty 1

## 2014-11-10 MED ORDER — HYDROMORPHONE HCL 1 MG/ML IJ SOLN
INTRAMUSCULAR | Status: AC
  Filled 2014-11-10: qty 1

## 2014-11-10 MED ORDER — LACTATED RINGERS IV SOLN
INTRAVENOUS | Status: DC
  Administered 2014-11-10: 13:00:00 via INTRAVENOUS

## 2014-11-10 MED ORDER — VANCOMYCIN HCL IN DEXTROSE 1-5 GM/200ML-% IV SOLN
1000.0000 mg | INTRAVENOUS | Status: AC
  Administered 2014-11-10: 1000 mg via INTRAVENOUS

## 2014-11-10 MED ORDER — OXYCODONE HCL 5 MG/5ML PO SOLN: 5.0000 mg | Freq: Once | ORAL | Status: DC | PRN

## 2014-11-10 MED ORDER — BUPIVACAINE-EPINEPHRINE 0.5% -1:200000 IJ SOLN
INTRAMUSCULAR | Status: DC | PRN
  Administered 2014-11-10: 20 mL

## 2014-11-10 MED ORDER — BUPIVACAINE-EPINEPHRINE (PF) 0.5% -1:200000 IJ SOLN
INTRAMUSCULAR | Status: AC
Start: 2014-11-10 — End: 2014-11-10
  Filled 2014-11-10: qty 30

## 2014-11-10 MED ORDER — METHYLPREDNISOLONE ACETATE 80 MG/ML IJ SUSP
INTRAMUSCULAR | Status: DC | PRN
  Administered 2014-11-10: 80 mg

## 2014-11-10 MED ORDER — VANCOMYCIN HCL 1000 MG IV SOLR
1000.0000 mg | INTRAVENOUS | Status: DC | PRN
  Administered 2014-11-10: 1000 mg via INTRAVENOUS

## 2014-11-10 MED ORDER — PROMETHAZINE HCL 25 MG/ML IJ SOLN: 6.2500 mg | INTRAMUSCULAR | Status: DC | PRN

## 2014-11-10 MED ORDER — MIDAZOLAM HCL 2 MG/2ML IJ SOLN
INTRAMUSCULAR | Status: AC
  Filled 2014-11-10: qty 2

## 2014-11-10 MED ORDER — MIDAZOLAM HCL 2 MG/2ML IJ SOLN: 1.0000 mg | INTRAMUSCULAR | Status: DC | PRN

## 2014-11-10 MED ORDER — FENTANYL CITRATE 0.05 MG/ML IJ SOLN
INTRAMUSCULAR | Status: DC | PRN
  Administered 2014-11-10 (×4): 50 ug via INTRAVENOUS

## 2014-11-10 MED ORDER — LIDOCAINE HCL (CARDIAC) 20 MG/ML IV SOLN
INTRAVENOUS | Status: DC | PRN
  Administered 2014-11-10: 50 mg via INTRAVENOUS

## 2014-11-10 MED ORDER — FENTANYL CITRATE 0.05 MG/ML IJ SOLN
INTRAMUSCULAR | Status: AC
  Filled 2014-11-10: qty 6

## 2014-11-10 MED ORDER — MIDAZOLAM HCL 5 MG/5ML IJ SOLN
INTRAMUSCULAR | Status: DC | PRN
  Administered 2014-11-10: 2 mg via INTRAVENOUS

## 2014-11-10 MED ORDER — OXYCODONE HCL 5 MG PO TABS: 5.0000 mg | ORAL_TABLET | Freq: Once | ORAL | Status: DC | PRN

## 2014-11-10 MED ORDER — SODIUM CHLORIDE 0.9 % IR SOLN
Status: DC | PRN
  Administered 2014-11-10: 3000 mL

## 2014-11-10 MED ORDER — HYDROCODONE-ACETAMINOPHEN 5-325 MG PO TABS: 1.0000 | ORAL_TABLET | Freq: Four times a day (QID) | ORAL | Status: DC | PRN

## 2014-11-10 SURGICAL SUPPLY — 39 items
BANDAGE ELASTIC 6 VELCRO ST LF (GAUZE/BANDAGES/DRESSINGS) ×3 IMPLANT
BLADE CUDA 5.5 (BLADE) IMPLANT
BLADE CUTTER GATOR 3.5 (BLADE) ×3 IMPLANT
BLADE GREAT WHITE 4.2 (BLADE) IMPLANT
BNDG GAUZE ELAST 4 BULKY (GAUZE/BANDAGES/DRESSINGS) ×3 IMPLANT
CANISTER SUCT 3000ML (MISCELLANEOUS) IMPLANT
DRAPE ARTHROSCOPY W/POUCH 114 (DRAPES) ×3 IMPLANT
DRAPE U-SHAPE 47X51 STRL (DRAPES) ×3 IMPLANT
DRSG EMULSION OIL 3X3 NADH (GAUZE/BANDAGES/DRESSINGS) ×3 IMPLANT
DURAPREP 26ML APPLICATOR (WOUND CARE) ×3 IMPLANT
ELECT MENISCUS 165MM 90D (ELECTRODE) IMPLANT
ELECT REM PT RETURN 9FT ADLT (ELECTROSURGICAL)
ELECTRODE REM PT RTRN 9FT ADLT (ELECTROSURGICAL) IMPLANT
GAUZE SPONGE 4X4 12PLY STRL (GAUZE/BANDAGES/DRESSINGS) ×3 IMPLANT
GLOVE BIO SURGEON STRL SZ 6.5 (GLOVE) ×4 IMPLANT
GLOVE BIO SURGEON STRL SZ8 (GLOVE) ×4 IMPLANT
GLOVE BIOGEL PI IND STRL 7.0 (GLOVE) ×2 IMPLANT
GLOVE BIOGEL PI IND STRL 8 (GLOVE) ×4 IMPLANT
GLOVE BIOGEL PI INDICATOR 7.0 (GLOVE) ×2
GLOVE BIOGEL PI INDICATOR 8 (GLOVE) ×2
GOWN STRL REUS W/ TWL LRG LVL3 (GOWN DISPOSABLE) ×2 IMPLANT
GOWN STRL REUS W/ TWL XL LVL3 (GOWN DISPOSABLE) ×4 IMPLANT
GOWN STRL REUS W/TWL LRG LVL3 (GOWN DISPOSABLE) ×3
GOWN STRL REUS W/TWL XL LVL3 (GOWN DISPOSABLE) ×6
IV NS IRRIG 3000ML ARTHROMATIC (IV SOLUTION) ×2 IMPLANT
KNEE WRAP E Z 3 GEL PACK (MISCELLANEOUS) ×3 IMPLANT
MANIFOLD NEPTUNE II (INSTRUMENTS) ×2 IMPLANT
PACK ARTHROSCOPY DSU (CUSTOM PROCEDURE TRAY) ×3 IMPLANT
PACK BASIN DAY SURGERY FS (CUSTOM PROCEDURE TRAY) ×3 IMPLANT
PENCIL BUTTON HOLSTER BLD 10FT (ELECTRODE) IMPLANT
SET ARTHROSCOPY TUBING (MISCELLANEOUS) ×3
SET ARTHROSCOPY TUBING LN (MISCELLANEOUS) ×2 IMPLANT
SHEET MEDIUM DRAPE 40X70 STRL (DRAPES) ×3 IMPLANT
SYR 3ML 18GX1 1/2 (SYRINGE) IMPLANT
TOWEL OR 17X24 6PK STRL BLUE (TOWEL DISPOSABLE) ×3 IMPLANT
TOWEL OR NON WOVEN STRL DISP B (DISPOSABLE) ×3 IMPLANT
WAND 30 DEG SABER W/CORD (SURGICAL WAND) IMPLANT
WAND STAR VAC 90 (SURGICAL WAND) IMPLANT
WATER STERILE IRR 1000ML POUR (IV SOLUTION) ×3 IMPLANT

## 2014-11-10 NOTE — Anesthesia Procedure Notes (Signed)
Procedure Name: LMA Insertion Date/Time: 11/10/2014 12:40 PM Performed by: Toula Moos L Pre-anesthesia Checklist: Patient identified, Emergency Drugs available, Suction available, Patient being monitored and Timeout performed Patient Re-evaluated:Patient Re-evaluated prior to inductionOxygen Delivery Method: Circle System Utilized Preoxygenation: Pre-oxygenation with 100% oxygen Intubation Type: IV induction Ventilation: Mask ventilation without difficulty LMA: LMA inserted LMA Size: 4.0 Number of attempts: 1 Airway Equipment and Method: Bite block Placement Confirmation: positive ETCO2 Tube secured with: Tape Dental Injury: Teeth and Oropharynx as per pre-operative assessment

## 2014-11-10 NOTE — Discharge Instructions (Signed)

## 2014-11-10 NOTE — Transfer of Care (Signed)
Immediate Anesthesia Transfer of Care Note  Patient: Holly Beasley  Procedure(s) Performed: Procedure(s): LEFT KNEE ARTHROSCOPY PARTIAL LATERAL MENISECTOMY CHONDROPLASTY (Left)  Patient Location: PACU  Anesthesia Type:General  Level of Consciousness: sedated and patient cooperative  Airway & Oxygen Therapy: Patient Spontanous Breathing and Patient connected to face mask oxygen  Post-op Assessment: Report given to RN and Post -op Vital signs reviewed and stable  Post vital signs: Reviewed and stable  Last Vitals:  Filed Vitals:   11/10/14 1212  BP: 134/83  Pulse: 77  Temp: 36.7 C  Resp: 20    Complications: No apparent anesthesia complications

## 2014-11-10 NOTE — Anesthesia Preprocedure Evaluation (Signed)
Anesthesia Evaluation    Airway        Dental   Pulmonary sleep apnea ,          Cardiovascular hypertension, + Peripheral Vascular Disease     Neuro/Psych  Headaches, PSYCHIATRIC DISORDERS Depression    GI/Hepatic Neg liver ROS, GERD-  ,  Endo/Other  diabetes  Renal/GU negative Renal ROS     Musculoskeletal   Abdominal   Peds  Hematology   Anesthesia Other Findings   Reproductive/Obstetrics                             Anesthesia Physical Anesthesia Plan  ASA: III  Anesthesia Plan: General   Post-op Pain Management:    Induction: Intravenous  Airway Management Planned: LMA  Additional Equipment:   Intra-op Plan:   Post-operative Plan: Extubation in OR  Informed Consent:   Plan Discussed with:   Anesthesia Plan Comments:         Anesthesia Quick Evaluation

## 2014-11-10 NOTE — Anesthesia Postprocedure Evaluation (Signed)
Anesthesia Post Note  Patient: Holly Beasley  Procedure(s) Performed: Procedure(s) (LRB): LEFT KNEE ARTHROSCOPY PARTIAL LATERAL MENISECTOMY CHONDROPLASTY (Left)  Anesthesia type: general  Patient location: PACU  Post pain: Pain level controlled  Post assessment: Patient's Cardiovascular Status Stable  Last Vitals:  Filed Vitals:   11/10/14 1500  BP: 171/92  Pulse: 77  Temp:   Resp: 12    Post vital signs: Reviewed and stable  Level of consciousness: sedated  Complications: No apparent anesthesia complications

## 2014-11-10 NOTE — Interval H&P Note (Signed)
OK for surgery PD 

## 2014-11-10 NOTE — Op Note (Signed)
#  [id number] 

## 2014-11-13 ENCOUNTER — Encounter (HOSPITAL_BASED_OUTPATIENT_CLINIC_OR_DEPARTMENT_OTHER): Payer: Self-pay | Admitting: Orthopaedic Surgery

## 2014-11-13 NOTE — Op Note (Signed)
Holly Beasley, Holly Beasley              ACCOUNT NO.:  [id number]  MEDICAL RECORD NO.:  [id number]  LOCATION:                                 FACILITY:  PHYSICIAN:  Monico Blitz. Nichlas Pitera, M.D.DATE OF BIRTH:  01-08-1951  DATE OF PROCEDURE:  11/10/2014 DATE OF DISCHARGE:  11/10/2014                              OPERATIVE REPORT   POSTOPERATIVE DIAGNOSES: 1. Left knee torn lateral meniscus. 2. Left knee chondromalacia.  POSTOPERATIVE DIAGNOSES: 1. Left knee torn lateral meniscus. 2. Left knee chondromalacia.  PROCEDURES: 1. Left knee partial lateral meniscectomy. 2. Left knee chondroplasty and abrasion, lateral and patellofemoral.  ANESTHESIA:  ATTENDING SURGEON:  Monico Blitz. Rhona Raider, M.D.  ASSISTANT:  None.  INDICATION FOR PROCEDURE:  The patient is a 64 year old woman with a long history of left knee difficulty.  This has persisted despite pills and an injection and bracing and time.  By MRI scan, she has some degenerative change and torn lateral meniscus.  She has pain which limits her ability to rest and remain active and she is offered an arthroscopy.  Informed operative consent was obtained after discussion of possible complications including reaction to anesthesia and infection.  SUMMARY OF FINDINGS AND PROCEDURE:  Under general anesthesia, an arthroscopy of the left knee was performed.  Suprapatellar pouch was benign while the patellofemoral joint exhibited some grade 3 and 4 change .  A thorough chondroplasty was done along with abrasion to bleeding bone to the intertrochlear groove.  Medial compartment looked relatively benign with just some mild meniscal tear addressed with a brief partial medial meniscectomy.  She had no degenerative changes in this compartment.  ACL looked intact.  The lateral compartment unfortunately had advanced grade 4 change on both aspects of the joint. She also had a torn lateral meniscus as shown on the MRI scan, and this was addressed with  about 20% partial lateral meniscectomy.  She was injected with the usual agents plus Depo-Medrol at the end of the case and discharged home same day to follow up in less than a week.  DESCRIPTION OF PROCEDURE:  The patient was taken operating suite where general anesthetic was applied without difficulty.  She was positioned supine and prepped and draped in normal sterile fashion.  After the administration of preop IV vancomycin and an appropriate time-out, an arthroscopy of the left knee was performed through total of 2 portals. Findings were as noted above and procedure consisted mostly of the partial lateral meniscectomy along with the abrasion chondroplasty patellofemoral.  The knee was thoroughly irrigated followed by placement of Marcaine with epinephrine and morphine plus some Depo-Medrol. Adaptic was placed over the portals followed by dry gauze and loose Ace wrap.  Estimated blood loss and intraoperative fluids can be obtained from anesthesia records.  DISPOSITION:  The patient was extubated in the operating room and taken to recovery in stable condition.  She was scheduled to go home same-day and follow up in the office in less than a week.  I will contact her by phone tonight.     Monico Blitz Rhona Raider, M.D.     PGD/MEDQ  D:  11/10/2014  T:  11/11/2014  Job:  [id number]

## 2014-12-05 ENCOUNTER — Other Ambulatory Visit: Payer: Self-pay | Admitting: Internal Medicine

## 2014-12-06 NOTE — Telephone Encounter (Signed)
Sent to the pharmacy by e-scribe. 

## 2014-12-07 ENCOUNTER — Telehealth: Payer: Self-pay | Admitting: *Deleted

## 2014-12-07 NOTE — Telephone Encounter (Signed)
Returning Holly Beasley's earlier phone call.  She is s/p endovenous laser ablation left greater saphenous vein with stab phlebectomy (left leg) by Dr. Mamie Nick on 10-09-2014.  She states she is s/p left knee arthroscopy with lateral menisectomy on 11-10-2014 by Dr. Melrose Nakayama.  She states she has been experiencing left leg swelling from her knee down to her foot.  She states she has a "pink" area above her left ankle that is warm to touch. She states she has an appointment to see Dr. Rhona Raider tomorrow(Friday 12-08-2014).  She states she would like to see Dr. Kellie Simmering as soon as possible.  Appointment made on 12-11-2014 (Monday) for venous reflux of left leg at 11:30A and to see Dr. Kellie Simmering at 12:15 PM.  Encouraged her to elevate left leg, wear compression hose (if she can tolerate), and take Ibuprofen 600 mg with meals (2-3 times daily).  Encouraged her to call [facility] if symptoms worsen or if she has further questions or concerns.  Holly Beasley verbalized understanding and appears relieved.

## 2014-12-08 ENCOUNTER — Other Ambulatory Visit: Payer: Self-pay | Admitting: *Deleted

## 2014-12-08 ENCOUNTER — Encounter: Payer: Self-pay | Admitting: Vascular Surgery

## 2014-12-08 DIAGNOSIS — M79662 Pain in left lower leg: Secondary | ICD-10-CM

## 2014-12-08 DIAGNOSIS — M79605 Pain in left leg: Secondary | ICD-10-CM

## 2014-12-08 DIAGNOSIS — M7989 Other specified soft tissue disorders: Principal | ICD-10-CM

## 2014-12-11 ENCOUNTER — Ambulatory Visit (HOSPITAL_COMMUNITY)
Admission: RE | Admit: 2014-12-11 | Discharge: 2014-12-11 | Disposition: A | Discharge status: Home / Self Care | Payer: 59 | Source: Ambulatory Visit | Attending: Vascular Surgery | Admitting: Vascular Surgery

## 2014-12-11 ENCOUNTER — Ambulatory Visit (INDEPENDENT_AMBULATORY_CARE_PROVIDER_SITE_OTHER): Payer: 59 | Admitting: Vascular Surgery

## 2014-12-11 VITALS — BP 159/84 | HR 80 | Resp 16 | Ht 68.0 in | Wt 194.0 lb

## 2014-12-11 DIAGNOSIS — R600 Localized edema: Secondary | ICD-10-CM

## 2014-12-11 DIAGNOSIS — M79605 Pain in left leg: Secondary | ICD-10-CM | POA: Diagnosis not present

## 2014-12-11 DIAGNOSIS — I83892 Varicose veins of left lower extremities with other complications: Secondary | ICD-10-CM

## 2014-12-11 DIAGNOSIS — M79662 Pain in left lower leg: Secondary | ICD-10-CM

## 2014-12-11 DIAGNOSIS — M7989 Other specified soft tissue disorders: Secondary | ICD-10-CM | POA: Diagnosis not present

## 2014-12-11 NOTE — Progress Notes (Signed)
Subjective:     Patient ID: Holly Beasley, female   DOB: 04-02-1951, 64 y.o.   MRN: [id number]  HPI this 64 year old female returns for evaluation of edema in the left leg. She had laser ablation of the left great saphenous vein performed in December 2015 for chronic edema with gross reflux throughout a large great saphenous vein and no deep vein reflux. She did well and had arthroscopy performed by Dr. Christena Flake 11/10/2014. A few weeks later she is started developing edema beginning at the knee level including the knee joint and the lower leg. That has persisted and she came today for further evaluation. She has been wearing her elastic compression stocking which has improved the situation. She has no edema on the contralateral right leg. She has no history of liver or renal insufficiency.  Past Medical History  Diagnosis Date  . Osteopenia     dexa -1.7 h -1.35   . Scoliosis     leg length discrepancy  . Allergy   . Depression   . Osteoarthritis   . Skin cancer     basal cell nose  . History of ETOH abuse     recovering  in remission  . Hyperlipidemia   . Dry eyes   . Varicose veins   . Hypertension   . Sleep apnea     did not meet criteria for cpap-uses a mouthpiece  . GERD (gastroesophageal reflux disease)   . Wears contact lenses     History  Substance Use Topics  . Smoking status: Never Smoker   . Smokeless tobacco: Never Used  . Alcohol Use: No     Comment: recovering-1983    Family History  Problem Relation Age of Onset  . Arthritis Brother   . Alcohol abuse Brother   . Lymphoma Mother   . Diabetes Mother   . Hyperlipidemia Mother   . Heart failure Father   . Hyperlipidemia Father   . Alcohol abuse Brother     Allergies  Allergen Reactions  . Amoxicillin     REACTION: unspecified  . Cephalexin     REACTION: unspecified  . Sulfamethoxazole     REACTION: unspecified     Current outpatient prescriptions:  .  atorvastatin (LIPITOR) 40 MG tablet,  Take 40 mg by mouth daily. , Disp: , Rfl: 3 .  CALCIUM PO, Take 500 mg by mouth 2 (two) times daily., Disp: , Rfl:  .  carvedilol (COREG) 12.5 MG tablet, Take 12.5 mg by mouth 2 (two) times daily with a meal. , Disp: , Rfl: 0 .  Cholecalciferol (VITAMIN D PO), Take 500 Units by mouth daily., Disp: , Rfl:  .  ciclesonide (OMNARIS) 50 MCG/ACT nasal spray, Place 2 sprays into both nostrils daily., Disp: , Rfl:  .  cycloSPORINE (RESTASIS) 0.05 % ophthalmic emulsion, Place 1 drop into both eyes at bedtime., Disp: , Rfl:  .  DULoxetine (CYMBALTA) 60 MG capsule, Take 120 mg by mouth at bedtime. , Disp: , Rfl:  .  HYDROcodone-acetaminophen (NORCO) 5-325 MG per tablet, Take 1-2 tablets by mouth every 6 (six) hours as needed for moderate pain., Disp: 40 tablet, Rfl: 0 .  losartan (COZAAR) 50 MG tablet, Take 1 tablet (50 mg total) by mouth daily., Disp: 90 tablet, Rfl: 0 .  metFORMIN (GLUCOPHAGE) 500 MG tablet, Take 3-4 tablets daily with a meal, Disp: , Rfl:  .  metFORMIN (GLUCOPHAGE-XR) 500 MG 24 hr tablet, TAKE 3 TABLETS EVERY DAY, Disp: 270 tablet, Rfl:  0 .  montelukast (SINGULAIR) 10 MG tablet, Take 10 mg by mouth at bedtime., Disp: , Rfl: 0 .  MULTIPLE VITAMIN PO, Take 1 tablet by mouth daily., Disp: , Rfl:  .  Omega-3 Fatty Acids (FISH OIL) 1000 MG CAPS, Take 1 capsule by mouth daily., Disp: , Rfl:  .  omeprazole (PRILOSEC) 40 MG capsule, Take 40 mg by mouth daily., Disp: , Rfl: 0  BP 159/84 mmHg  Pulse 80  Resp 16  Ht 5\' 8"  (1.727 m)  Wt 194 lb (87.998 kg)  BMI 29.50 kg/m2  Body mass index is 29.5 kg/(m^2).         Review of Systems denies chest pain, dyspnea on exertion, PND, orthopnea, hemoptysis, claudication.     Objective:   Physical Exam BP 159/84 mmHg  Pulse 80  Resp 16  Ht 5\' 8"  (1.727 m)  Wt 194 lb (87.998 kg)  BMI 29.50 kg/m2  Gen. well-developed well-nourished female in no apparent distress alert and oriented 3 Lungs no rhonchi or wheezing Left leg with 1+ edema  pitting in the pretibial region. This begins at the knee level and extends to the foot. 3+ dorsalis pedis pulse palpable. No bulging varicosities noted.  That ordered a venous duplex exam the left leg which I reviewed and interpreted. There is no DVT. There is no superficial venous thrombophlebitis. The left great saphenous vein is totally closed from the calf to near the saphenofemoral junction as previously noted.     Assessment:     Edema left leg 2 months post laser ablation left great saphenous vein in one month post arthroscopy left knee-no evidence of DVT-successful laser ablation left great saphenous vein    Plan:     #1 long leg elastic compression stockings 20-30 millimeter gradient #2 elevate foot of bed 2-3 inches at night and elevate legs intermittently during day #3 no need for further vascular workup at this time

## 2014-12-12 ENCOUNTER — Encounter: Payer: Self-pay | Admitting: Internal Medicine

## 2014-12-12 ENCOUNTER — Other Ambulatory Visit (INDEPENDENT_AMBULATORY_CARE_PROVIDER_SITE_OTHER): Payer: 59

## 2014-12-12 DIAGNOSIS — E119 Type 2 diabetes mellitus without complications: Secondary | ICD-10-CM

## 2014-12-12 DIAGNOSIS — I10 Essential (primary) hypertension: Secondary | ICD-10-CM

## 2014-12-12 LAB — BASIC METABOLIC PANEL
BUN: 9 mg/dL (ref 6–23)
CALCIUM: 8.9 mg/dL (ref 8.4–10.5)
CHLORIDE: 105 meq/L (ref 96–112)
CO2: 27 meq/L (ref 19–32)
Creatinine, Ser: 0.72 mg/dL (ref 0.40–1.20)
GFR: 86.74 mL/min (ref >60.00)
Glucose, Bld: 222 mg/dL — ABNORMAL HIGH (ref 70–99)
POTASSIUM: 3.8 meq/L (ref 3.5–5.1)
Sodium: 138 mEq/L (ref 135–145)

## 2014-12-12 LAB — HEMOGLOBIN A1C: HEMOGLOBIN A1C: 7.4 % — AB (ref 4.6–6.5)

## 2014-12-17 ENCOUNTER — Other Ambulatory Visit: Payer: Self-pay | Admitting: Internal Medicine

## 2014-12-18 ENCOUNTER — Ambulatory Visit (INDEPENDENT_AMBULATORY_CARE_PROVIDER_SITE_OTHER): Payer: 59 | Admitting: Internal Medicine

## 2014-12-18 ENCOUNTER — Encounter: Payer: Self-pay | Admitting: Internal Medicine

## 2014-12-18 VITALS — BP 145/80 | Temp 97.6°F | Ht 68.0 in | Wt 194.3 lb

## 2014-12-18 DIAGNOSIS — Z8709 Personal history of other diseases of the respiratory system: Secondary | ICD-10-CM

## 2014-12-18 DIAGNOSIS — E785 Hyperlipidemia, unspecified: Secondary | ICD-10-CM

## 2014-12-18 DIAGNOSIS — E119 Type 2 diabetes mellitus without complications: Secondary | ICD-10-CM

## 2014-12-18 DIAGNOSIS — I10 Essential (primary) hypertension: Secondary | ICD-10-CM | POA: Insufficient documentation

## 2014-12-18 DIAGNOSIS — J329 Chronic sinusitis, unspecified: Secondary | ICD-10-CM

## 2014-12-18 DIAGNOSIS — Z79899 Other long term (current) drug therapy: Secondary | ICD-10-CM | POA: Insufficient documentation

## 2014-12-18 DIAGNOSIS — J309 Allergic rhinitis, unspecified: Secondary | ICD-10-CM

## 2014-12-18 MED ORDER — METFORMIN HCL ER 500 MG PO TB24: 2000.0000 mg | ORAL_TABLET | Freq: Every day | ORAL | Status: DC

## 2014-12-18 MED ORDER — LOSARTAN POTASSIUM 100 MG PO TABS: 100.0000 mg | ORAL_TABLET | Freq: Every day | ORAL | Status: DC

## 2014-12-18 MED ORDER — CICLESONIDE 50 MCG/ACT NA SUSP: 2.0000 | Freq: Every day | NASAL | Status: DC

## 2014-12-18 NOTE — Telephone Encounter (Signed)
Sent to the pharmacy by e-scribe. 

## 2014-12-18 NOTE — Patient Instructions (Addendum)
Blood sugar control has gotten worse Avoid sugars and simple carbs  Will do a referral to  Diabetes education an teaching  Learn about eating and how to monitor glucose   Intensify lifestyle interventions.as we discussed. Increase metformin to 4 500 mg per day 2 twice a day. Increase losartan to 100 mg per day   Ok to take over the [employer] ahead about the cymbalta refill that we can also rx .,  Plan  Labs and fu in 4 months or as needed    .

## 2014-12-18 NOTE — Progress Notes (Signed)
Pre visit review using our clinic review tool, if applicable. No additional management support is needed unless otherwise documented below in the visit note.  Chief Complaint  Patient presents with  . Follow-up    HPI: Holly Beasley  comes in today for follow up of  multiple medical problems.  Since last visit     Jan 29th mensicus surgery left knee  and vein December.  Post surgical  swelling.   DM holidays and less cardio.  meformin tid instead of 2000  No se noted  Gets shaking low bg at times  Doesn't check bg and hasnt been to diabetes teaching .   LIPID taking med  HT added arb lower when had surgery  135   Nose of med  50 mg   omnaris : From [place]  Needs refill  Has helped more than others  For her chronic allergyu sinus condition  cymbalta  120 mg per day  [place]  releaseing her  Can we take this over `1``? ROS: See pertinent positives and negatives per HPI.  Past Medical History  Diagnosis Date  . Osteopenia     dexa -1.7 h -1.35   . Scoliosis     leg length discrepancy  . Allergy   . Depression   . Osteoarthritis   . Skin cancer     basal cell nose  . History of ETOH abuse     recovering  in remission  . Hyperlipidemia   . Dry eyes   . Varicose veins   . Hypertension   . Sleep apnea     did not meet criteria for cpap-uses a mouthpiece  . GERD (gastroesophageal reflux disease)   . Wears contact lenses     Family History  Problem Relation Age of Onset  . Arthritis Brother   . Alcohol abuse Brother   . Lymphoma Mother   . Diabetes Mother   . Hyperlipidemia Mother   . Heart failure Father   . Hyperlipidemia Father   . Alcohol abuse Brother     History   Social History  . Marital Status: Married    Spouse Name: N/A  . Number of Children: N/A  . Years of Education: N/A   Social History Main Topics  . Smoking status: Never Smoker   . Smokeless tobacco: Never Used  . Alcohol Use: No     Comment: recovering-1983  . Drug Use: No  .  Sexual Activity: Not on file   Other Topics Concern  . 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    Retired Conservation officer, nature   Married   ETOH recovering   [place] 2    Has family [place]in [place]             Outpatient Encounter Prescriptions as of 12/18/2014  Medication Sig  . atorvastatin (LIPITOR) 40 MG tablet Take 40 mg by mouth daily.   Marland Kitchen CALCIUM PO Take 500 mg by mouth 2 (two) times daily.  . carvedilol (COREG) 12.5 MG tablet Take 12.5 mg by mouth 2 (two) times daily with a meal.   . Cholecalciferol (VITAMIN D PO) Take 500 Units by mouth daily.  . ciclesonide (OMNARIS) 50 MCG/ACT nasal spray Place 2 sprays into both nostrils daily.  . cycloSPORINE (RESTASIS) 0.05 % ophthalmic emulsion Place 1 drop into both eyes at bedtime.  . DULoxetine (CYMBALTA) 60 MG capsule Take  120 mg by mouth at bedtime.   Marland Kitchen HYDROcodone-acetaminophen (NORCO) 5-325 MG per tablet Take 1-2 tablets by mouth every 6 (six) hours as needed for moderate pain.  . montelukast (SINGULAIR) 10 MG tablet Take 10 mg by mouth at bedtime.  . MULTIPLE VITAMIN PO Take 1 tablet by mouth daily.  . Omega-3 Fatty Acids (FISH OIL) 1000 MG CAPS Take 1 capsule by mouth daily.  Marland Kitchen omeprazole (PRILOSEC) 40 MG capsule Take 40 mg by mouth daily.  . [DISCONTINUED] ciclesonide (OMNARIS) 50 MCG/ACT nasal spray Place 2 sprays into both nostrils daily.  . [DISCONTINUED] losartan (COZAAR) 50 MG tablet Take 1 tablet (50 mg total) by mouth daily.  Marland Kitchen losartan (COZAAR) 100 MG tablet Take 1 tablet (100 mg total) by mouth daily.  . metFORMIN (GLUCOPHAGE-XR) 500 MG 24 hr tablet Take 4 tablets (2,000 mg total) by mouth daily.  . [DISCONTINUED] metFORMIN (GLUCOPHAGE) 500 MG tablet Take 3-4 tablets daily with a meal  . [DISCONTINUED] metFORMIN (GLUCOPHAGE-XR) 500 MG 24 hr tablet TAKE 3 TABLETS EVERY DAY    EXAM:  BP 145/80 mmHg  Temp(Src) 97.6 F (36.4 C) (Oral)  Ht 5\' 8"  (1.727  m)  Wt 194 lb 4.8 oz (88.134 kg)  BMI 29.55 kg/m2  Body mass index is 29.55 kg/(m^2).  GENERAL: vitals reviewed and listed above, alert, oriented, appears well hydrated and in no acute distress HEENT: atraumatic, conjunctiva  clear, no obvious abnormalities on inspection of external nose and ears NECK: no obvious masses on inspection palpation  LUNGS: clear to auscultation bilaterally, no wheezes, rales or rhonchi, good air movement CV: HRRR, no clubbing cyanosis nl cap refill  MS: moves all extremities without noticeable focal  Abnormality lle inb compression wrap lslight limp  PSYCH: pleasant and cooperative, no obvious depression or anxiety Lab Results  Component Value Date   WBC 8.0 08/23/2014   HGB 15.4* 11/10/2014   HCT 39.5 08/23/2014   PLT 300.0 08/23/2014   GLUCOSE 222* 12/12/2014   CHOL 166 08/23/2014   TRIG 236.0* 08/23/2014   HDL 36.80* 08/23/2014   LDLDIRECT 95.1 08/23/2014   LDLCALC 91 04/18/2014   ALT 21 08/23/2014   AST 20 08/23/2014   NA 138 12/12/2014   K 3.8 12/12/2014   CL 105 12/12/2014   CREATININE 0.72 12/12/2014   BUN 9 12/12/2014   CO2 27 12/12/2014   TSH 1.80 08/23/2014   HGBA1C 7.4* 12/12/2014   MICROALBUR 0.6 08/23/2014   BP Readings from Last 3 Encounters:  12/18/14 145/80  12/11/14 159/84  11/10/14 168/76   Wt Readings from Last 3 Encounters:  12/18/14 194 lb 4.8 oz (88.134 kg)  12/11/14 194 lb (87.998 kg)  11/10/14 191 lb (86.637 kg)     ASSESSMENT AND PLAN:  Discussed the following assessment and plan:  Diabetes mellitus type 2, controlled, without complications - worsengin control shaky at times diabetes educ referral nutrition and  BG monitoring  incmetformin to 2000 per day as tolerated - Plan: Amb Referral to [facility]  Essential hypertension - better but not at goal incrase cozaar to 100 mg per day  - Plan: Amb Referral to [facility]  Hx of bacterial sinusitis - ok to take over rx teh omnaris  from dr Carmelina Peal   Medication management - dr Casimiro Needle releaseing her ok to contact us for refills of cymbalta  when needed and we will fu.  Hyperlipidemia - Plan: Amb Referral to [facility]  Allergic rhinitis, unspecified allergic  rhinitis type  Chronic sinusitis, unspecified location rx omnaris   Total visit 62mins > 50% spent counseling and coordinating care     -Patient advised to return or notify health care team  if symptoms worsen ,persist or new concerns arise.  Patient Instructions  Blood sugar control has gotten worse Avoid sugars and simple carbs  Will do a referral to  Diabetes education an teaching  Learn about eating and how to monitor glucose   Intensify lifestyle interventions.as we discussed. Increase metformin to 4 500 mg per day 2 twice a day. Increase losartan to 100 mg per day   Ok to take over the [employer] ahead about the cymbalta refill that we can also rx .,  Plan  Labs and fu in 4 months or as needed    .  Standley Brooking. Murdock Jellison M.D.

## 2014-12-28 ENCOUNTER — Encounter: Payer: Self-pay | Admitting: Internal Medicine

## 2015-01-02 ENCOUNTER — Encounter: Payer: 59 | Attending: Internal Medicine

## 2015-01-02 VITALS — Ht 68.0 in | Wt 195.1 lb

## 2015-01-02 DIAGNOSIS — Z713 Dietary counseling and surveillance: Secondary | ICD-10-CM | POA: Diagnosis not present

## 2015-01-02 DIAGNOSIS — E119 Type 2 diabetes mellitus without complications: Secondary | ICD-10-CM | POA: Diagnosis not present

## 2015-01-02 NOTE — Progress Notes (Signed)
Patient was seen on 01/02/15 for the first of a series of three diabetes self-management courses at [facility].  Patient Education Plan per assessed needs and concerns is to attend four course education program for Diabetes Self Management Education.  The following learning objectives were met by the patient during this class:  Describe diabetes  State some common risk factors for diabetes  Defines the role of glucose and insulin  Identifies type of diabetes and pathophysiology  Describe the relationship between diabetes and cardiovascular risk  State the members of the Healthcare Team  States the rationale for glucose monitoring  State when to test glucose  State their individual Target Range  State the importance of logging glucose readings  Describe how to interpret glucose readings  Identifies A1C target  Explain the correlation between A1c and eAG values  State symptoms and treatment of high blood glucose  State symptoms and treatment of low blood glucose  Explain proper technique for glucose testing  Identifies proper sharps disposal  Handouts given during class include:  Living Well with Diabetes book  Carb Counting and Meal Planning book  Meal Plan Card  Carbohydrate guide  Meal planning worksheet  Low Sodium Flavoring Tips  The diabetes portion plate  Z3G to eAG Conversion Chart  Diabetes Medications  Diabetes Recommended Care Schedule  Support Group  Diabetes Success Plan  Core Class Satisfaction Survey  Follow-Up Plan:  Attend core 2

## 2015-01-09 DIAGNOSIS — E119 Type 2 diabetes mellitus without complications: Secondary | ICD-10-CM

## 2015-01-09 NOTE — Progress Notes (Signed)

## 2015-01-16 ENCOUNTER — Encounter: Payer: 59 | Attending: Internal Medicine

## 2015-01-16 DIAGNOSIS — E119 Type 2 diabetes mellitus without complications: Secondary | ICD-10-CM | POA: Diagnosis not present

## 2015-01-16 DIAGNOSIS — Z713 Dietary counseling and surveillance: Secondary | ICD-10-CM | POA: Insufficient documentation

## 2015-01-22 ENCOUNTER — Other Ambulatory Visit: Payer: Self-pay | Admitting: Internal Medicine

## 2015-01-22 NOTE — Telephone Encounter (Signed)
Sent to the pharmacy by e-scribe. 

## 2015-01-22 NOTE — Progress Notes (Signed)
Patient was seen on 01/16/15 for the third of a series of three diabetes self-management courses at the [facility].   Holly Beasley the amount of activity recommended for healthy living . Describe activities suitable for individual needs . Identify ways to regularly incorporate activity into daily life . Identify barriers to activity and ways to over come these barriers  Identify diabetes medications being personally used and their primary action for lowering glucose and possible side effects . Describe role of stress on blood glucose and develop strategies to address psychosocial issues . Identify diabetes complications and ways to prevent them  Explain how to manage diabetes during illness . Evaluate success in meeting personal goal . Establish 2-3 goals that they will plan to diligently work on until they return for the  18-month follow-up visit  Goals:   I will count my carb choices at most meals and snacks  Drastically reduce sugar consumption; snack healthier  I will be active 5 times a week  I train 3X a week - need to add walking  I will take my diabetes medications as scheduled  I will eat less unhealthy fats by eating less butter  I will test my glucose at my discretion per MD  I will look at patterns in my record book at least 3 days a month  To help manage stress I will  walk at least 3-5 times a week  Your patient has identified these potential barriers to change:  Motivation  Your patient has identified their diabetes self-care support plan as  Family Support On-line Resources Plan:  Attend Core 4 in 4 months

## 2015-01-23 ENCOUNTER — Telehealth: Payer: Self-pay | Admitting: Internal Medicine

## 2015-01-23 MED ORDER — GLUCOSE BLOOD VI STRP: ORAL_STRIP | Status: DC

## 2015-01-23 MED ORDER — ACCU-CHEK AVIVA PLUS W/DEVICE KIT
PACK | Status: DC
Start: 2015-01-23 — End: 2016-09-01

## 2015-01-23 NOTE — Telephone Encounter (Signed)
Pt said the name of the meter that her insurance will approve   [employer] [facility] and test strips   Pharmacy [facility] [place]

## 2015-01-23 NOTE — Telephone Encounter (Signed)
Sent to the pharmacy by e-scribe. 

## 2015-02-13 ENCOUNTER — Telehealth: Payer: Self-pay | Admitting: Internal Medicine

## 2015-02-13 NOTE — Telephone Encounter (Signed)
Spoke to the pt.  Notified her that she should take 1 tablet daily.  This was increased in her last OV.   Filled for 1 year in March.  She notified me that she had been taking two tablets daily.  Informed her to stop taking two daily.  Take one unless instructed by [facility].  Asked the pt if she has had any lightheadedness, dizziness, fainting etc.  She denied all sx.  She will call the pharmacy to see if she can get replacement tablets.  Instructed her to call back if needed.  Will now forward to [facility] for review.

## 2015-02-13 NOTE — Telephone Encounter (Signed)
Pt has been taking losartan 100 mg twice a day instead of once a day. Pt needs to know should she take one or 2 pills a day. Pt said dr Regis Bill told her to take 2 pills a day Pt [facility] [address]

## 2015-02-13 NOTE — Telephone Encounter (Signed)
Agree 100 mg losartan per day  ( vs 2 ;50 mg per day)

## 2015-03-05 ENCOUNTER — Other Ambulatory Visit: Payer: Self-pay | Admitting: Internal Medicine

## 2015-03-05 NOTE — Telephone Encounter (Signed)
Sent to the pharmacy by e-scribe. 

## 2015-04-12 ENCOUNTER — Other Ambulatory Visit (INDEPENDENT_AMBULATORY_CARE_PROVIDER_SITE_OTHER): Payer: 59

## 2015-04-12 DIAGNOSIS — E119 Type 2 diabetes mellitus without complications: Secondary | ICD-10-CM

## 2015-04-12 DIAGNOSIS — I10 Essential (primary) hypertension: Secondary | ICD-10-CM | POA: Diagnosis not present

## 2015-04-12 LAB — BASIC METABOLIC PANEL
BUN: 11 mg/dL (ref 6–23)
CALCIUM: 9.6 mg/dL (ref 8.4–10.5)
CHLORIDE: 104 meq/L (ref 96–112)
CO2: 30 meq/L (ref 19–32)
Creatinine, Ser: 0.8 mg/dL (ref 0.40–1.20)
GFR: 76.73 mL/min (ref >60.00)
Glucose, Bld: 124 mg/dL — ABNORMAL HIGH (ref 70–99)
POTASSIUM: 4.2 meq/L (ref 3.5–5.1)
Sodium: 141 mEq/L (ref 135–145)

## 2015-04-12 LAB — HEMOGLOBIN A1C: HEMOGLOBIN A1C: 6.2 % (ref 4.6–6.5)

## 2015-04-19 ENCOUNTER — Ambulatory Visit (INDEPENDENT_AMBULATORY_CARE_PROVIDER_SITE_OTHER): Payer: 59 | Admitting: Internal Medicine

## 2015-04-19 ENCOUNTER — Encounter: Payer: Self-pay | Admitting: Internal Medicine

## 2015-04-19 VITALS — BP 130/72 | Temp 98.2°F | Ht 68.0 in | Wt 190.0 lb

## 2015-04-19 DIAGNOSIS — E119 Type 2 diabetes mellitus without complications: Secondary | ICD-10-CM

## 2015-04-19 DIAGNOSIS — I10 Essential (primary) hypertension: Secondary | ICD-10-CM | POA: Diagnosis not present

## 2015-04-19 DIAGNOSIS — R600 Localized edema: Secondary | ICD-10-CM

## 2015-04-19 MED ORDER — MONTELUKAST SODIUM 10 MG PO TABS: 10.0000 mg | ORAL_TABLET | Freq: Every day | ORAL | Status: DC

## 2015-04-19 NOTE — Progress Notes (Signed)
Pre visit review using our clinic review tool, if applicable. No additional management support is needed unless otherwise documented below in the visit note.  Chief Complaint  Patient presents with  . Follow-up    HPI: HONESTY MENTA 64 y.o.  comes in for chronic disease/ medication management   DM went to the diabetes class was very helpful has cut out all sugar still doing strength training training plans on getting some more aerobic you. Feels fine has been only able to tolerate 1500 mg of metformin a day taking 500 3 times a day with each meal hasn't checked her readings recently Hypertension:  Seems to be okay not checking no side effects of medicine  ROS: See pertinent positives and negatives per HPI. Left leg swelling does decrease with constant use a compression stocking on the left however when he stops it it comes back get some sensitive skin symptoms when she wears it every single day  Past Medical History  Diagnosis Date  . Osteopenia     dexa -1.7 h -1.35   . Scoliosis     leg length discrepancy  . Allergy   . Depression   . Osteoarthritis   . Skin cancer     basal cell nose  . History of ETOH abuse     recovering  in remission  . Hyperlipidemia   . Dry eyes   . Varicose veins   . Hypertension   . Sleep apnea     did not meet criteria for cpap-uses a mouthpiece  . GERD (gastroesophageal reflux disease)   . Wears contact lenses   . Diabetes mellitus without complication     Family History  Problem Relation Age of Onset  . Arthritis Brother   . Alcohol abuse Brother   . Lymphoma Mother   . Diabetes Mother   . Hyperlipidemia Mother   . Heart failure Father   . Hyperlipidemia Father   . Alcohol abuse Brother     History   Social History  . Marital Status: Married    Spouse Name: N/A  . Number of Children: N/A  . Years of Education: N/A   Social History Main Topics  . Smoking status: Never Smoker   . Smokeless tobacco: Never Used  . Alcohol  Use: No     Comment: recovering-1983  . Drug Use: No  . Sexual Activity: Not on file   Other Topics Concern  . 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    Retired Conservation officer, nature   Married   ETOH recovering   [place] [address]    [place] family [place]in [place]             Outpatient Prescriptions Prior to Visit  Medication Sig Dispense Refill  . atorvastatin (LIPITOR) 40 MG tablet Take 40 mg by mouth daily.   3  . atorvastatin (LIPITOR) 40 MG tablet TAKE 1 TABLET (40 MG TOTAL) BY MOUTH DAILY. 90 tablet 1  . Blood Glucose Monitoring Suppl (ACCU-CHEK AVIVA PLUS) W/DEVICE KIT Use to check blood glucose 1-2 times daily 1 kit 0  . CALCIUM PO Take 500 mg by mouth 2 (two) times daily.    . carvedilol (COREG) 12.5 MG tablet Take 12.5 mg by mouth 2 (two) times daily with a meal.   0  . carvedilol (COREG) 12.5 MG tablet TAKE 1 TABLET BY MOUTH TWICE A DAY WITH  A MEAL 180 tablet 0  . Cholecalciferol (VITAMIN D PO) Take 500 Units by mouth daily.    . ciclesonide (OMNARIS) 50 MCG/ACT nasal spray Place 2 sprays into both nostrils daily. 12.5 g prn  . cycloSPORINE (RESTASIS) 0.05 % ophthalmic emulsion Place 1 drop into both eyes at bedtime.    . DULoxetine (CYMBALTA) 60 MG capsule Take 120 mg by mouth at bedtime.     Marland Kitchen glucose blood (ACCU-CHEK AVIVA PLUS) test strip Use to check blood glucose 1-2 times daily 100 each 12  . losartan (COZAAR) 100 MG tablet Take 1 tablet (100 mg total) by mouth daily. 90 tablet 3  . metFORMIN (GLUCOPHAGE-XR) 500 MG 24 hr tablet TAKE 3 TABLETS EVERY DAY 270 tablet 0  . MULTIPLE VITAMIN PO Take 1 tablet by mouth daily.    . Omega-3 Fatty Acids (FISH OIL) 1000 MG CAPS Take 1 capsule by mouth daily.    Marland Kitchen omeprazole (PRILOSEC) 40 MG capsule Take 40 mg by mouth daily.  0  . omeprazole (PRILOSEC) 40 MG capsule TAKE 1 CAPSULE BY MOUTH ONCE DAILY 90 capsule 0  . metFORMIN (GLUCOPHAGE-XR) 500 MG 24 hr tablet Take 4  tablets (2,000 mg total) by mouth daily. 360 tablet 03  . montelukast (SINGULAIR) 10 MG tablet TAKE 1 TABLET AT BEDTIME 90 tablet 0  . HYDROcodone-acetaminophen (NORCO) 5-325 MG per tablet Take 1-2 tablets by mouth every 6 (six) hours as needed for moderate pain. 40 tablet 0  . montelukast (SINGULAIR) 10 MG tablet Take 10 mg by mouth at bedtime.  0   No facility-administered medications prior to visit.     EXAM:  BP 130/72 mmHg  Temp(Src) 98.2 F (36.8 C) (Oral)  Ht _0  (1.727 m)  Wt 190 lb (86.183 kg)  BMI 28.90 kg/m2  Body mass index is 28.9 kg/(m^2).  GENERAL: vitals reviewed and listed above, alert, oriented, appears well hydrated and in no acute distress HEENT: atraumatic, conjunctiva  clear, no obvious abnormalities on inspection of external nose and ears MS: moves all extremities without noticeable focal  abnormality compression stocking on left lower extremity noted. PSYCH: pleasant and cooperative, no obvious depression or anxiety Lab Results  Component Value Date   WBC 8.0 08/23/2014   HGB 15.4* 11/10/2014   HCT 39.5 08/23/2014   PLT 300.0 08/23/2014   GLUCOSE 124* 04/12/2015   CHOL 166 08/23/2014   TRIG 236.0* 08/23/2014   HDL 36.80* 08/23/2014   LDLDIRECT 95.1 08/23/2014   LDLCALC 91 04/18/2014   ALT 21 08/23/2014   AST 20 08/23/2014   NA 141 04/12/2015   K 4.2 04/12/2015   CL 104 04/12/2015   CREATININE 0.80 04/12/2015   BUN 11 04/12/2015   CO2 30 04/12/2015   TSH 1.80 08/23/2014   HGBA1C 6.2 04/12/2015   MICROALBUR 0.6 08/23/2014   Diabetes Health Maintenance Due  Topic Date Due  . OPHTHALMOLOGY EXAM  07/05/2014  . FOOT EXAM  09/16/2015  . HEMOGLOBIN A1C  10/12/2015   BP Readings from Last 3 Encounters:  04/19/15 130/72  12/18/14 145/80  12/11/14 159/84   Wt Readings from Last 3 Encounters:  04/19/15 190 lb (86.183 kg)  01/02/15 195 lb 1.6 oz (88.497 kg)  12/18/14 194 lb 4.8 oz (88.134 kg)       ASSESSMENT AND PLAN:  Discussed the  following assessment and plan:  Diabetes mellitus type 2, controlled, without complications - Much improved A1c down to 6.2 continued lifestyle intervention encouraged as much more effective medicines.  Essential hypertension - Improved with lifestyle intervention continue  Leg edema, left - Discussed use of stocking can do back with vascular needed perhaps use 2 days and then off 1 Wellness and labs hga1c  December  -Patient advised to return or notify health care team  if symptoms worsen ,persist or new concerns arise.  Patient Instructions   Continue lifestyle intervention healthy eating and exercise . Sugars is much better .      Keep it up!.  Wt Readings from Last 3 Encounters:  04/19/15 190 lb (86.183 kg)  01/02/15 195 lb 1.6 oz (88.497 kg)  12/18/14 194 lb 4.8 oz (88.134 kg)   BP Readings from Last 3 Encounters:  04/19/15 130/72  12/18/14 145/80  12/11/14 159/84        Barre Aydelott K. Ethridge Sollenberger M.D.

## 2015-04-19 NOTE — Patient Instructions (Signed)
Continue lifestyle intervention healthy eating and exercise . Sugars is much better .      Keep it up!.  Wt Readings from Last 3 Encounters:  04/19/15 190 lb (86.183 kg)  01/02/15 195 lb 1.6 oz (88.497 kg)  12/18/14 194 lb 4.8 oz (88.134 kg)   BP Readings from Last 3 Encounters:  04/19/15 130/72  12/18/14 145/80  12/11/14 159/84

## 2015-04-23 LAB — HM DIABETES EYE EXAM

## 2015-04-30 ENCOUNTER — Encounter: Payer: Self-pay | Admitting: Family Medicine

## 2015-05-01 ENCOUNTER — Other Ambulatory Visit: Payer: Self-pay | Admitting: Internal Medicine

## 2015-05-01 NOTE — Telephone Encounter (Signed)
Sent to the pharmacy by e-scribe. 

## 2015-05-19 ENCOUNTER — Other Ambulatory Visit: Payer: Self-pay | Admitting: Internal Medicine

## 2015-05-22 NOTE — Telephone Encounter (Signed)
Sent to the pharmacy by e-scribe. 

## 2015-06-04 ENCOUNTER — Other Ambulatory Visit: Payer: Self-pay | Admitting: Internal Medicine

## 2015-06-05 NOTE — Telephone Encounter (Signed)
Sent to the pharmacy by e-scribe.  Pt is scheduled for cpx on 10/16/15

## 2015-06-06 ENCOUNTER — Encounter: Payer: 59 | Attending: Internal Medicine

## 2015-06-06 DIAGNOSIS — Z713 Dietary counseling and surveillance: Secondary | ICD-10-CM | POA: Diagnosis not present

## 2015-06-06 DIAGNOSIS — E119 Type 2 diabetes mellitus without complications: Secondary | ICD-10-CM | POA: Insufficient documentation

## 2015-06-06 NOTE — Progress Notes (Signed)
Appt start time: 0900 end time:  1000.  Patient was seen on 06/06/2015 for a review of the series of three diabetes self-management courses at the [facility]. The following learning objectives were met by the patient during this class:  . Reviewed blood glucose monitoring and interpretation including the recommended target ranges and Hgb A1c.  . Reviewed on carb counting, importance of regularly scheduled meals/snacks, and meal planning.  . Reviewed the effects of physical activity on glucose levels and long-term glucose control.  Recommended goal of 150 minutes of physical activity/week. . Reviewed patient medications and discussed role of medication on blood glucose and possible side effects. . Discussed strategies to manage stress, psychosocial issues, and other obstacles to diabetes management. . Encouraged moderate weight reduction to improve glucose levels.   . Reviewed short-term complications: hyper- and hypo-glycemia.  Discussed causes, symptoms, and treatment options. . Reviewed prevention, detection, and treatment of long-term complications.  Discussed the role of prolonged elevated glucose levels on body systems.  Goals:  Follow Diabetes Meal Plan as instructed  Eat 3 meals and 2 snacks, every 3-5 hrs  Limit carbohydrate intake to 45 grams carbohydrate/meal Limit carbohydrate intake to 15 grams carbohydrate/snack Add lean protein foods to meals/snacks  Monitor glucose levels as instructed by your doctor  Aim for goal of 15-30 mins of physical activity daily as tolerated  Bring food record and glucose log to your next nutrition visit   

## 2015-06-15 ENCOUNTER — Other Ambulatory Visit: Payer: Self-pay | Admitting: Internal Medicine

## 2015-06-15 NOTE — Telephone Encounter (Signed)
Sent to the pharmacy by e-scribe. 

## 2015-07-12 ENCOUNTER — Ambulatory Visit (INDEPENDENT_AMBULATORY_CARE_PROVIDER_SITE_OTHER): Payer: 59 | Admitting: Family Medicine

## 2015-07-12 DIAGNOSIS — Z23 Encounter for immunization: Secondary | ICD-10-CM

## 2015-10-02 ENCOUNTER — Other Ambulatory Visit (INDEPENDENT_AMBULATORY_CARE_PROVIDER_SITE_OTHER): Payer: 59

## 2015-10-02 DIAGNOSIS — Z0000 Encounter for general adult medical examination without abnormal findings: Secondary | ICD-10-CM | POA: Diagnosis not present

## 2015-10-02 LAB — CBC WITH DIFFERENTIAL/PLATELET
BASOS PCT: 0.8 % (ref 0.0–3.0)
Basophils Absolute: 0.1 10*3/uL (ref 0.0–0.1)
Eosinophils Absolute: 0.4 10*3/uL (ref 0.0–0.7)
Eosinophils Relative: 4.9 % (ref 0.0–5.0)
HCT: 36.5 % (ref 36.0–46.0)
Hemoglobin: 12.2 g/dL (ref 12.0–15.0)
Lymphocytes Relative: 37.6 % (ref 12.0–46.0)
Lymphs Abs: 2.9 10*3/uL (ref 0.7–4.0)
MCHC: 33.5 g/dL (ref 30.0–36.0)
MCV: 92.7 fl (ref 78.0–100.0)
Monocytes Absolute: 0.8 10*3/uL (ref 0.1–1.0)
Monocytes Relative: 9.8 % (ref 3.0–12.0)
Neutro Abs: 3.7 10*3/uL (ref 1.4–7.7)
Neutrophils Relative %: 46.9 % (ref 43.0–77.0)
PLATELETS: 333 10*3/uL (ref 150.0–400.0)
RBC: 3.94 Mil/uL (ref 3.87–5.11)
RDW: 13 % (ref 11.5–15.5)
WBC: 7.8 10*3/uL (ref 4.0–10.5)

## 2015-10-02 LAB — HEPATIC FUNCTION PANEL
ALT: 18 U/L (ref 0–35)
AST: 16 U/L (ref 0–37)
Albumin: 4 g/dL (ref 3.5–5.2)
Alkaline Phosphatase: 57 U/L (ref 39–117)
BILIRUBIN DIRECT: 0.1 mg/dL (ref 0.0–0.3)
Total Bilirubin: 0.6 mg/dL (ref 0.2–1.2)
Total Protein: 7 g/dL (ref 6.0–8.3)

## 2015-10-02 LAB — BASIC METABOLIC PANEL
BUN: 10 mg/dL (ref 6–23)
CO2: 29 meq/L (ref 19–32)
CREATININE: 0.76 mg/dL (ref 0.40–1.20)
Calcium: 9.7 mg/dL (ref 8.4–10.5)
Chloride: 103 mEq/L (ref 96–112)
GFR: 81.29 mL/min (ref >60.00)
Glucose, Bld: 170 mg/dL — ABNORMAL HIGH (ref 70–99)
Potassium: 4.2 mEq/L (ref 3.5–5.1)
Sodium: 140 mEq/L (ref 135–145)

## 2015-10-02 LAB — LIPID PANEL
CHOLESTEROL: 143 mg/dL (ref 0–200)
HDL: 50.7 mg/dL (ref >39.00)
LDL Cholesterol: 69 mg/dL (ref 0–99)
NonHDL: 91.99
TRIGLYCERIDES: 117 mg/dL (ref 0.0–149.0)
Total CHOL/HDL Ratio: 3
VLDL: 23.4 mg/dL (ref 0.0–40.0)

## 2015-10-02 LAB — MICROALBUMIN / CREATININE URINE RATIO
Creatinine,U: 52.9 mg/dL
Microalb Creat Ratio: 1.3 mg/g (ref 0.0–30.0)
Microalb, Ur: <0.7 mg/dL (ref 0.0–1.9)

## 2015-10-02 LAB — HEMOGLOBIN A1C: Hgb A1c MFr Bld: 6.9 % — ABNORMAL HIGH (ref 4.6–6.5)

## 2015-10-02 LAB — TSH: TSH: 2.54 u[IU]/mL (ref 0.35–4.50)

## 2015-10-05 ENCOUNTER — Other Ambulatory Visit: Payer: Self-pay | Admitting: Internal Medicine

## 2015-10-16 ENCOUNTER — Encounter: Payer: Self-pay | Admitting: Internal Medicine

## 2015-10-16 ENCOUNTER — Ambulatory Visit (INDEPENDENT_AMBULATORY_CARE_PROVIDER_SITE_OTHER): Payer: 59 | Admitting: Internal Medicine

## 2015-10-16 VITALS — BP 136/78 | Temp 97.3°F | Ht 67.75 in | Wt 186.0 lb

## 2015-10-16 DIAGNOSIS — I10 Essential (primary) hypertension: Secondary | ICD-10-CM

## 2015-10-16 DIAGNOSIS — Q845 Enlarged and hypertrophic nails: Secondary | ICD-10-CM

## 2015-10-16 DIAGNOSIS — Z0000 Encounter for general adult medical examination without abnormal findings: Secondary | ICD-10-CM | POA: Diagnosis not present

## 2015-10-16 DIAGNOSIS — E119 Type 2 diabetes mellitus without complications: Secondary | ICD-10-CM | POA: Diagnosis not present

## 2015-10-16 DIAGNOSIS — L309 Dermatitis, unspecified: Secondary | ICD-10-CM

## 2015-10-16 DIAGNOSIS — E785 Hyperlipidemia, unspecified: Secondary | ICD-10-CM

## 2015-10-16 DIAGNOSIS — Z79899 Other long term (current) drug therapy: Secondary | ICD-10-CM

## 2015-10-16 DIAGNOSIS — L602 Onychogryphosis: Secondary | ICD-10-CM

## 2015-10-16 NOTE — Patient Instructions (Addendum)
Please see dermatology  As soon as possibel consideration of tinea on top of your foot eczema .  Continue compresion stockings as tolerated .  Intensify lifestyle interventions. To help sugars   Lipids are  very  good this time  Make sure bp readings are below goal 140/90      Health Maintenance, Female Adopting a healthy lifestyle and getting preventive care can go a long way to promote health and wellness. Talk with your health care provider about what schedule of regular examinations is right for you. This is a good chance for you to check in with your provider about disease prevention and staying healthy. In between checkups, there are plenty of things you can do on your own. Experts have done a lot of research about which lifestyle changes and preventive measures are most likely to keep you healthy. Ask your health care provider for more information. WEIGHT AND DIET  Eat a healthy diet  Be sure to include plenty of vegetables, fruits, low-fat dairy products, and lean protein.  Do not eat a lot of foods high in solid fats, added sugars, or salt.  Get regular exercise. This is one of the most important things you can do for your health.  Most adults should exercise for at least 150 minutes each week. The exercise should increase your heart rate and make you sweat (moderate-intensity exercise).  Most adults should also do strengthening exercises at least twice a week. This is in addition to the moderate-intensity exercise.  Maintain a healthy weight  Body mass index (BMI) is a measurement that can be used to identify possible weight problems. It estimates body fat based on height and weight. Your health care provider can help determine your BMI and help you achieve or maintain a healthy weight.  For females 39 years of age and older:   A BMI below 18.5 is considered underweight.  A BMI of 18.5 to 24.9 is normal.  A BMI of 25 to 29.9 is considered overweight.  A BMI of 30 and  above is considered obese.  Watch levels of cholesterol and blood lipids  You should start having your blood tested for lipids and cholesterol at 65 years of age, then have this test every 5 years.  You may need to have your cholesterol levels checked more often if:  Your lipid or cholesterol levels are high.  You are older than 65 years of age.  You are at high risk for heart disease.  CANCER SCREENING   Lung Cancer  Lung cancer screening is recommended for adults 26-24 years old who are at high risk for lung cancer because of a history of smoking.  A yearly low-dose CT scan of the lungs is recommended for people who:  Currently smoke.  Have quit within the past 15 years.  Have at least a 30-pack-year history of smoking. A pack year is smoking an average of one pack of cigarettes a day for 1 year.  Yearly screening should continue until it has been 15 years since you quit.  Yearly screening should stop if you develop a health problem that would prevent you from having lung cancer treatment.  Breast Cancer  Practice breast self-awareness. This means understanding how your breasts normally appear and feel.  It also means doing regular breast self-exams. Let your health care provider know about any changes, no matter how small.  If you are in your 20s or 30s, you should have a clinical breast exam (CBE) by a  health care provider every 1-3 years as part of a regular health exam.  If you are 40 or older, have a CBE every year. Also consider having a breast X-ray (mammogram) every year.  If you have a family history of breast cancer, talk to your health care provider about genetic screening.  If you are at high risk for breast cancer, talk to your health care provider about having an MRI and a mammogram every year.  Breast cancer gene (BRCA) assessment is recommended for women who have family members with BRCA-related cancers. BRCA-related cancers  include:  Breast.  Ovarian.  Tubal.  Peritoneal cancers.  Results of the assessment will determine the need for genetic counseling and BRCA1 and BRCA2 testing. Cervical Cancer Your health care provider may recommend that you be screened regularly for cancer of the pelvic organs (ovaries, uterus, and vagina). This screening involves a pelvic examination, including checking for microscopic changes to the surface of your cervix (Pap test). You may be encouraged to have this screening done every 3 years, beginning at age 21.  For women ages 30-65, health care providers may recommend pelvic exams and Pap testing every 3 years, or they may recommend the Pap and pelvic exam, combined with testing for human papilloma virus (HPV), every 5 years. Some types of HPV increase your risk of cervical cancer. Testing for HPV may also be done on women of any age with unclear Pap test results.  Other health care providers may not recommend any screening for nonpregnant women who are considered low risk for pelvic cancer and who do not have symptoms. Ask your health care provider if a screening pelvic exam is right for you.  If you have had past treatment for cervical cancer or a condition that could lead to cancer, you need Pap tests and screening for cancer for at least 20 years after your treatment. If Pap tests have been discontinued, your risk factors (such as having a new sexual partner) need to be reassessed to determine if screening should resume. Some women have medical problems that increase the chance of getting cervical cancer. In these cases, your health care provider may recommend more frequent screening and Pap tests. Colorectal Cancer  This type of cancer can be detected and often prevented.  Routine colorectal cancer screening usually begins at 65 years of age and continues through 65 years of age.  Your health care provider may recommend screening at an earlier age if you have risk factors for  colon cancer.  Your health care provider may also recommend using home test kits to check for hidden blood in the stool.  A small camera at the end of a tube can be used to examine your colon directly (sigmoidoscopy or colonoscopy). This is done to check for the earliest forms of colorectal cancer.  Routine screening usually begins at age 50.  Direct examination of the colon should be repeated every 5-10 years through 65 years of age. However, you may need to be screened more often if early forms of precancerous polyps or small growths are found. Skin Cancer  Check your skin from head to toe regularly.  Tell your health care provider about any new moles or changes in moles, especially if there is a change in a mole's shape or color.  Also tell your health care provider if you have a mole that is larger than the size of a pencil eraser.  Always use sunscreen. Apply sunscreen liberally and repeatedly throughout the day.    Protect yourself by wearing long sleeves, pants, a wide-brimmed hat, and sunglasses whenever you are outside. HEART DISEASE, DIABETES, AND HIGH BLOOD PRESSURE   High blood pressure causes heart disease and increases the risk of stroke. High blood pressure is more likely to develop in:  People who have blood pressure in the high end of the normal range (130-139/85-89 mm Hg).  People who are overweight or obese.  People who are African American.  If you are 18-39 years of age, have your blood pressure checked every 3-5 years. If you are 40 years of age or older, have your blood pressure checked every year. You should have your blood pressure measured twice--once when you are at a hospital or clinic, and once when you are not at a hospital or clinic. Record the average of the two measurements. To check your blood pressure when you are not at a hospital or clinic, you can use:  An automated blood pressure machine at a pharmacy.  A home blood pressure monitor.  If you  are between 55 years and 79 years old, ask your health care provider if you should take aspirin to prevent strokes.  Have regular diabetes screenings. This involves taking a blood sample to check your fasting blood sugar level.  If you are at a normal weight and have a low risk for diabetes, have this test once every three years after 65 years of age.  If you are overweight and have a high risk for diabetes, consider being tested at a younger age or more often. PREVENTING INFECTION  Hepatitis B  If you have a higher risk for hepatitis B, you should be screened for this virus. You are considered at high risk for hepatitis B if:  You were born in a country where hepatitis B is common. Ask your health care provider which countries are considered high risk.  Your parents were born in a high-risk country, and you have not been immunized against hepatitis B (hepatitis B vaccine).  You have HIV or AIDS.  You use needles to inject street drugs.  You live with someone who has hepatitis B.  You have had sex with someone who has hepatitis B.  You get hemodialysis treatment.  You take certain medicines for conditions, including cancer, organ transplantation, and autoimmune conditions. Hepatitis C  Blood testing is recommended for:  Everyone born from 1945 through 1965.  Anyone with known risk factors for hepatitis C. Sexually transmitted infections (STIs)  You should be screened for sexually transmitted infections (STIs) including gonorrhea and chlamydia if:  You are sexually active and are younger than 65 years of age.  You are older than 65 years of age and your health care provider tells you that you are at risk for this type of infection.  Your sexual activity has changed since you were last screened and you are at an increased risk for chlamydia or gonorrhea. Ask your health care provider if you are at risk.  If you do not have HIV, but are at risk, it may be recommended that you  take a prescription medicine daily to prevent HIV infection. This is called pre-exposure prophylaxis (PrEP). You are considered at risk if:  You are sexually active and do not regularly use condoms or know the HIV status of your partner(s).  You take drugs by injection.  You are sexually active with a partner who has HIV. Talk with your health care provider about whether you are at high risk of being infected   with HIV. If you choose to begin PrEP, you should first be tested for HIV. You should then be tested every 3 months for as long as you are taking PrEP.  PREGNANCY   If you are premenopausal and you may become pregnant, ask your health care provider about preconception counseling.  If you may become pregnant, take 400 to 800 micrograms (mcg) of folic acid every day.  If you want to prevent pregnancy, talk to your health care provider about birth control (contraception). OSTEOPOROSIS AND MENOPAUSE   Osteoporosis is a disease in which the bones lose minerals and strength with aging. This can result in serious bone fractures. Your risk for osteoporosis can be identified using a bone density scan.  If you are 11 years of age or older, or if you are at risk for osteoporosis and fractures, ask your health care provider if you should be screened.  Ask your health care provider whether you should take a calcium or vitamin D supplement to lower your risk for osteoporosis.  Menopause may have certain physical symptoms and risks.  Hormone replacement therapy may reduce some of these symptoms and risks. Talk to your health care provider about whether hormone replacement therapy is right for you.  HOME CARE INSTRUCTIONS   Schedule regular health, dental, and eye exams.  Stay current with your immunizations.   Do not use any tobacco products including cigarettes, chewing tobacco, or electronic cigarettes.  If you are pregnant, do not drink alcohol.  If you are breastfeeding, limit how  much and how often you drink alcohol.  Limit alcohol intake to no more than 1 drink per day for nonpregnant women. One drink equals 12 ounces of beer, 5 ounces of wine, or 1 ounces of hard liquor.  Do not use street drugs.  Do not share needles.  Ask your health care provider for help if you need support or information about quitting drugs.  Tell your health care provider if you often feel depressed.  Tell your health care provider if you have ever been abused or do not feel safe at home.   This information is not intended to replace advice given to you by your health care provider. Make sure you discuss any questions you have with your health care provider.   Document Released: 04/14/2011 Document Revised: 10/20/2014 Document Reviewed: 08/31/2013 Elsevier Interactive Patient Education [employer].

## 2015-10-16 NOTE — Progress Notes (Signed)
Pre visit review using our clinic review tool, if applicable. No additional management support is needed unless otherwise documented below in the visit note.  Chief Complaint  Patient presents with  . Annual Exam    HPI: Patient  Holly Beasley  65 y.o. comes in today for Preventive Health Care visit  And Chronic disease management  DM ok cut out sweets most sugars  More however  Since  Holidays  HT not checking readings  Taking meds  Ongoing months  Skin rash on feet has foot dermatitis and saw derm in remote past   Itch and some stinging   Going up foot now  Edema helped but stockings when uses them .   Health Maintenance  Topic Date Due  . Hepatitis C Screening  [birth year]  . HIV Screening  03/05/1966  . PAP SMEAR  11/13/2012  . FOOT EXAM  09/16/2015  . HEMOGLOBIN A1C  04/01/2016  . OPHTHALMOLOGY EXAM  04/22/2016  . INFLUENZA VACCINE  05/13/2016  . MAMMOGRAM  07/28/2016  . TETANUS/TDAP  04/03/2018  . COLONOSCOPY  02/08/2019  . ZOSTAVAX  Completed   Health Maintenance Review LIFESTYLE:  Exercise:   Trainer and walking  Tobacco/ETS: no Alcohol: per day no Sugar beverages:no Sleep: 5- 8 and 4 hours nape  Retired erratic .  Drug use: no   ROS:  See above GEN/ HEENT: No fever, significant weight changes sweats headaches vision problems hearing changes, CV/ PULM; No chest pain shortness of breath cough, syncope,edema  change in exercise tolerance. GI /GU: No adominal pain, vomiting, change in bowel habits. No blood in the stool. No significant GU symptoms. SKIN/HEME: ,no  suspicious lesions or bleeding. No lymphadenopathy, nodules, masses.  NEURO/ PSYCH:  No neurologic signs such as weakness numbness. No depression anxiety. IMM/ Allergy: No unusual infections.  Allergy .   REST of 12 system review negative except as per HPI   Past Medical History  Diagnosis Date  . Osteopenia     dexa -1.7 h -1.35   . Scoliosis     leg length discrepancy  . Allergy   .  Depression   . Osteoarthritis   . Skin cancer     basal cell nose  . History of ETOH abuse     recovering  in remission  . Hyperlipidemia   . Dry eyes   . Varicose veins   . Hypertension   . Sleep apnea     did not meet criteria for cpap-uses a mouthpiece  . GERD (gastroesophageal reflux disease)   . Wears contact lenses   . Diabetes mellitus without complication [facility])     Past Surgical History  Procedure Laterality Date  . Colonoscopy w/ polypectomy      precancerous   2004  . Tonsillectomy and adenoidectomy  1967  . Nasal sinus surgery  11-13-2008    polyps removed  . Skin cancer removal      basal cell face  . Breast biopsy      2003-rt  . Knee arthroscopy with lateral menisectomy Left 11/10/2014    Procedure: LEFT KNEE ARTHROSCOPY PARTIAL LATERAL MENISECTOMY CHONDROPLASTY;  Surgeon: Hessie Dibble, MD;  Location: [place];  Service: Orthopedics;  Laterality: Left;    Family History  Problem Relation Age of Onset  . Arthritis Brother   . Alcohol abuse Brother   . Lymphoma Mother   . Diabetes Mother   . Hyperlipidemia Mother   . Heart failure Father   . Hyperlipidemia  Father   . Alcohol abuse Brother     Social History   Social History  . Marital Status: Married    Spouse Name: N/A  . Number of Children: N/A  . Years of Education: N/A   Social History Main Topics  . Smoking status: Never Smoker   . Smokeless tobacco: Never Used  . Alcohol Use: No     Comment: recovering-1983  . Drug Use: No  . Sexual Activity: Not Asked   Other Topics Concern  . 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    Retired Conservation officer, nature   Married   ETOH recovering   [place] 2    Has family iin [place]             Outpatient Prescriptions Prior to Visit  Medication Sig Dispense Refill  . atorvastatin (LIPITOR) 40 MG tablet TAKE 1 TABLET (40 MG TOTAL) BY MOUTH DAILY. 90 tablet 1    . CALCIUM PO Take 500 mg by mouth 2 (two) times daily.    . carvedilol (COREG) 12.5 MG tablet TAKE 1 TABLET BY MOUTH TWICE A DAY WITH A MEAL 180 tablet 1  . Cholecalciferol (VITAMIN D PO) Take 500 Units by mouth daily.    . ciclesonide (OMNARIS) 50 MCG/ACT nasal spray Place 2 sprays into both nostrils daily. 12.5 g prn  . cycloSPORINE (RESTASIS) 0.05 % ophthalmic emulsion Place 1 drop into both eyes at bedtime.    . DULoxetine (CYMBALTA) 60 MG capsule Take 120 mg by mouth at bedtime.     Marland Kitchen losartan (COZAAR) 100 MG tablet Take 1 tablet (100 mg total) by mouth daily. 90 tablet 3  . metFORMIN (GLUCOPHAGE-XR) 500 MG 24 hr tablet TAKE 3 TABLETS EVERY DAY 270 tablet 1  . montelukast (SINGULAIR) 10 MG tablet TAKE 1 TABLET (10 MG TOTAL) BY MOUTH AT BEDTIME. 90 tablet 1  . MULTIPLE VITAMIN PO Take 1 tablet by mouth daily.    . Omega-3 Fatty Acids (FISH OIL) 1000 MG CAPS Take 1 capsule by mouth daily.    Marland Kitchen omeprazole (PRILOSEC) 40 MG capsule TAKE 1 CAPSULE BY MOUTH ONCE DAILY 90 capsule 1  . Blood Glucose Monitoring Suppl (ACCU-CHEK AVIVA PLUS) W/DEVICE KIT Use to check blood glucose 1-2 times daily (Patient not taking: Reported on 10/16/2015) 1 kit 0  . glucose blood (ACCU-CHEK AVIVA PLUS) test strip Use to check blood glucose 1-2 times daily (Patient not taking: Reported on 10/16/2015) 100 each 12  . atorvastatin (LIPITOR) 40 MG tablet Take 40 mg by mouth daily.   3  . carvedilol (COREG) 12.5 MG tablet Take 12.5 mg by mouth 2 (two) times daily with a meal.   0  . omeprazole (PRILOSEC) 40 MG capsule Take 40 mg by mouth daily.  0   No facility-administered medications prior to visit.     EXAM:  BP 136/78 mmHg  Temp(Src) 97.3 F (36.3 C) (Oral)  Ht 5' 7.75" (1.721 m)  Wt 186 lb (84.369 kg)  BMI 28.49 kg/m2  Body mass index is 28.49 kg/(m^2).  Physical Exam: Vital signs reviewed [id number]:OPFY is a well-developed well-nourished alert cooperative    who appearsr stated age in no acute distress.   HEENT: normocephalic atraumatic , Eyes: PERRL EOM's full, conjunctiva clear, Nares: paten,t no deformity discharge or tenderness., Ears: no deformity EAC's clear TMs with normal landmarks. Mouth: clear OP, no lesions, edema.  Moist mucous membranes. Dentition in adequate repair. NECK: supple without masses, thyromegaly or bruits. CHEST/PULM:  Clear to auscultation and percussion breath sounds equal no wheeze , rales or rhonchi. No chest wall deformities or tenderness. CV: PMI is nondisplaced, S1 S2 no gallops, murmurs, rubs. Peripheral pulses are full without delay.No JVD .  ABDOMEN: Bowel sounds normal nontender  No guard or rebound, no hepato splenomegal no CVA tenderness.  No hernia. Extremtities:  No clubbing cyanosis mild edema above ankle left more than right  no acute joint swelling or redness no focal atrophy NEURO:  Oriented x3, cranial nerves 3-12 appear to be intact, no obvious focal weakness,gait within normal limits no abnormal reflexes or asymmetrical SKIN: No acute rashes normal turgor, color, no bruising or petechiae. Peeling left foot  Scaling and red area   .  Thickened  Nail. Lateral foot moand dorsum left    Thickening callu on heel right no ulcers   PSYCH: Oriented, good eye contact, no obvious depression anxiety, cognition and judgment appear normal. LN: no cervical axillary inguinal adenopathy Pelvic per gyne   Diabetic Foot Exam - Simple   Simple Foot Form  Diabetic Foot exam was performed with the following findings:  Yes 10/16/2015  9:21 AM  Visual Inspection  See comments:  Yes  Sensation Testing  Intact to touch and monofilament testing bilaterally:  Yes  Pulse Check  Posterior Tibialis and Dorsalis pulse intact bilaterally:  Yes  Comments  See text rash and  Scaling thickeded toe nails       Lab Results  Component Value Date   WBC 7.8 10/02/2015   HGB 12.2 10/02/2015   HCT 36.5 10/02/2015   PLT 333.0 10/02/2015   GLUCOSE 170* 10/02/2015   CHOL 143  10/02/2015   TRIG 117.0 10/02/2015   HDL 50.70 10/02/2015   LDLDIRECT 95.1 08/23/2014   LDLCALC 69 10/02/2015   ALT 18 10/02/2015   AST 16 10/02/2015   NA 140 10/02/2015   K 4.2 10/02/2015   CL 103 10/02/2015   CREATININE 0.76 10/02/2015   BUN 10 10/02/2015   CO2 29 10/02/2015   TSH 2.54 10/02/2015   HGBA1C 6.9* 10/02/2015   MICROALBUR <0.7 10/02/2015    ASSESSMENT AND PLAN:  Discussed the following assessment and plan:  Visit for preventive health examination  Controlled type 2 diabetes mellitus without complication, without long-term current use of insulin ([place]) - same meds lsi fu 6 months or as needed  Essential hypertension  HLD (hyperlipidemia) - improved findings continue meds  Foot dermatitis - left  chronic mod to  severe. infla,mmation laterally sharp borders see derm ? tinea other  Thickened nails reviewed monitor hcm  .  Patient Care Team: Burnis Medin, MD as PCP - General Camillo Flaming, OD as Referring Physician (Optometry) Jiles Prows, MD as Attending Physician ([place]) Rolm Bookbinder, MD as Consulting Physician (Dermatology) Azucena Fallen, MD as Consulting Physician (Obstetrics and Gynecology) Norma Fredrickson, MD as Consulting Physician (Psychiatry) Mal Misty, MD as Consulting Physician (Vascular Surgery) Patient Instructions  Please see dermatology  As soon as possibel consideration of tinea on top of your foot eczema .  Continue compresion stockings as tolerated .  Intensify lifestyle interventions. To help sugars   Lipids are  very  good this time  Make sure bp readings are below goal 140/90      Health Maintenance, Female Adopting a healthy lifestyle and getting preventive care can go a long way to promote health and  wellness. Talk with your health care provider about what schedule of regular examinations is right for you. This is a good chance for you to check in with your provider about disease prevention and staying healthy. In  between checkups, there are plenty of things you can do on your own. Experts have done a lot of research about which lifestyle changes and preventive measures are most likely to keep you healthy. Ask your health care provider for more information. WEIGHT AND DIET  Eat a healthy diet  Be sure to include plenty of vegetables, fruits, low-fat dairy products, and lean protein.  Do not eat a lot of foods high in solid fats, added sugars, or salt.  Get regular exercise. This is one of the most important things you can do for your health.  Most adults should exercise for at least 150 minutes each week. The exercise should increase your heart rate and make you sweat (moderate-intensity exercise).  Most adults should also do strengthening exercises at least twice a week. This is in addition to the moderate-intensity exercise.  Maintain a healthy weight  Body mass index (BMI) is a measurement that can be used to identify possible weight problems. It estimates body fat based on height and weight. Your health care provider can help determine your BMI and help you achieve or maintain a healthy weight.  For females 45 years of age and older:   A BMI below 18.5 is considered underweight.  A BMI of 18.5 to 24.9 is normal.  A BMI of 25 to 29.9 is considered overweight.  A BMI of 30 and above is considered obese.  Watch levels of cholesterol and blood lipids  You should start having your blood tested for lipids and cholesterol at 65 years of age, then have this test every 5 years.  You may need to have your cholesterol levels checked more often if:  Your lipid or cholesterol levels are high.  You are older than 64 years of age.  You are at high risk for heart disease.  CANCER SCREENING   Lung Cancer  Lung cancer screening is recommended for adults 51-75 years old who are at high risk for lung cancer because of a history of smoking.  A yearly low-dose CT scan of the lungs is recommended  for people who:  Currently smoke.  Have quit within the past 15 years.  Have at least a 30-pack-year history of smoking. A pack year is smoking an average of one pack of cigarettes a day for 1 year.  Yearly screening should continue until it has been 15 years since you quit.  Yearly screening should stop if you develop a health problem that would prevent you from having lung cancer treatment.  Breast Cancer  Practice breast self-awareness. This means understanding how your breasts normally appear and feel.  It also means doing regular breast self-exams. Let your health care provider know about any changes, no matter how small.  If you are in your 20s or 30s, you should have a clinical breast exam (CBE) by a health care provider every 1-3 years as part of a regular health exam.  If you are 38 or older, have a CBE every year. Also consider having a breast X-ray (mammogram) every year.  If you have a family history of breast cancer, talk to your health care provider about genetic screening.  If you are at high risk for breast cancer, talk to your health care provider about having an MRI and a  mammogram every year.  Breast cancer gene (BRCA) assessment is recommended for women who have family members with BRCA-related cancers. BRCA-related cancers include:  Breast.  Ovarian.  Tubal.  Peritoneal cancers.  Results of the assessment will determine the need for genetic counseling and BRCA1 and BRCA2 testing. Cervical Cancer Your health care provider may recommend that you be screened regularly for cancer of the pelvic organs (ovaries, uterus, and vagina). This screening involves a pelvic examination, including checking for microscopic changes to the surface of your cervix (Pap test). You may be encouraged to have this screening done every 3 years, beginning at age 33.  For women ages 19-65, health care providers may recommend pelvic exams and Pap testing every 3 years, or they may  recommend the Pap and pelvic exam, combined with testing for human papilloma virus (HPV), every 5 years. Some types of HPV increase your risk of cervical cancer. Testing for HPV may also be done on women of any age with unclear Pap test results.  Other health care providers may not recommend any screening for nonpregnant women who are considered low risk for pelvic cancer and who do not have symptoms. Ask your health care provider if a screening pelvic exam is right for you.  If you have had past treatment for cervical cancer or a condition that could lead to cancer, you need Pap tests and screening for cancer for at least 20 years after your treatment. If Pap tests have been discontinued, your risk factors (such as having a new sexual partner) need to be reassessed to determine if screening should resume. Some women have medical problems that increase the chance of getting cervical cancer. In these cases, your health care provider may recommend more frequent screening and Pap tests. Colorectal Cancer  This type of cancer can be detected and often prevented.  Routine colorectal cancer screening usually begins at 65 years of age and continues through 65 years of age.  Your health care provider may recommend screening at an earlier age if you have risk factors for colon cancer.  Your health care provider may also recommend using home test kits to check for hidden blood in the stool.  A small camera at the end of a tube can be used to examine your colon directly (sigmoidoscopy or colonoscopy). This is done to check for the earliest forms of colorectal cancer.  Routine screening usually begins at age 67.  Direct examination of the colon should be repeated every 5-10 years through 65 years of age. However, you may need to be screened more often if early forms of precancerous polyps or small growths are found. Skin Cancer  Check your skin from head to toe regularly.  Tell your health care provider  about any new moles or changes in moles, especially if there is a change in a mole's shape or color.  Also tell your health care provider if you have a mole that is larger than the size of a pencil eraser.  Always use sunscreen. Apply sunscreen liberally and repeatedly throughout the day.  Protect yourself by wearing long sleeves, pants, a wide-brimmed hat, and sunglasses whenever you are outside. HEART DISEASE, DIABETES, AND HIGH BLOOD PRESSURE   High blood pressure causes heart disease and increases the risk of stroke. High blood pressure is more likely to develop in:  People who have blood pressure in the high end of the normal range (130-139/85-89 mm Hg).  People who are overweight or obese.  People who are African  American.  If you are 91-73 years of age, have your blood pressure checked every 3-5 years. If you are 82 years of age or older, have your blood pressure checked every year. You should have your blood pressure measured twice--once when you are at a hospital or clinic, and once when you are not at a hospital or clinic. Record the average of the two measurements. To check your blood pressure when you are not at a hospital or clinic, you can use:  An automated blood pressure machine at a pharmacy.  A home blood pressure monitor.  If you are between 87 years and 69 years old, ask your health care provider if you should take aspirin to prevent strokes.  Have regular diabetes screenings. This involves taking a blood sample to check your fasting blood sugar level.  If you are at a normal weight and have a low risk for diabetes, have this test once every three years after 65 years of age.  If you are overweight and have a high risk for diabetes, consider being tested at a younger age or more often. PREVENTING INFECTION  Hepatitis B  If you have a higher risk for hepatitis B, you should be screened for this virus. You are considered at high risk for hepatitis B if:  You were  born in a country where hepatitis B is common. Ask your health care provider which countries are considered high risk.  Your parents were born in a high-risk country, and you have not been immunized against hepatitis B (hepatitis B vaccine).  You have HIV or AIDS.  You use needles to inject street drugs.  You live with someone who has hepatitis B.  You have had sex with someone who has hepatitis B.  You get hemodialysis treatment.  You take certain medicines for conditions, including cancer, organ transplantation, and autoimmune conditions. Hepatitis C  Blood testing is recommended for:  Everyone born from [birth year] through 1965.  Anyone with known risk factors for hepatitis C. Sexually transmitted infections (STIs)  You should be screened for sexually transmitted infections (STIs) including gonorrhea and chlamydia if:  You are sexually active and are younger than 65 years of age.  You are older than 65 years of age and your health care provider tells you that you are at risk for this type of infection.  Your sexual activity has changed since you were last screened and you are at an increased risk for chlamydia or gonorrhea. Ask your health care provider if you are at risk.  If you do not have HIV, but are at risk, it may be recommended that you take a prescription medicine daily to prevent HIV infection. This is called pre-exposure prophylaxis (PrEP). You are considered at risk if:  You are sexually active and do not regularly use condoms or know the HIV status of your partner(s).  You take drugs by injection.  You are sexually active with a partner who has HIV. Talk with your health care provider about whether you are at high risk of being infected with HIV. If you choose to begin PrEP, you should first be tested for HIV. You should then be tested every 3 months for as long as you are taking PrEP.  PREGNANCY   If you are premenopausal and you may become pregnant, ask your  health care provider about preconception counseling.  If you may become pregnant, take 400 to 800 micrograms (mcg) of folic acid every day.  If you want to prevent  pregnancy, talk to your health care provider about birth control (contraception). OSTEOPOROSIS AND MENOPAUSE   Osteoporosis is a disease in which the bones lose minerals and strength with aging. This can result in serious bone fractures. Your risk for osteoporosis can be identified using a bone density scan.  If you are 104 years of age or older, or if you are at risk for osteoporosis and fractures, ask your health care provider if you should be screened.  Ask your health care provider whether you should take a calcium or vitamin D supplement to lower your risk for osteoporosis.  Menopause may have certain physical symptoms and risks.  Hormone replacement therapy may reduce some of these symptoms and risks. Talk to your health care provider about whether hormone replacement therapy is right for you.  HOME CARE INSTRUCTIONS   Schedule regular health, dental, and eye exams.  Stay current with your immunizations.   Do not use any tobacco products including cigarettes, chewing tobacco, or electronic cigarettes.  If you are pregnant, do not drink alcohol.  If you are breastfeeding, limit how much and how often you drink alcohol.  Limit alcohol intake to no more than 1 drink per day for nonpregnant women. One drink equals 12 ounces of beer, 5 ounces of wine, or 1 ounces of hard liquor.  Do not use street drugs.  Do not share needles.  Ask your health care provider for help if you need support or information about quitting drugs.  Tell your health care provider if you often feel depressed.  Tell your health care provider if you have ever been abused or do not feel safe at home.   This information is not intended to replace advice given to you by your health care provider. Make sure you discuss any questions you have with  your health care provider.   Document Released: 04/14/2011 Document Revised: 10/20/2014 Document Reviewed: 08/31/2013 Elsevier Interactive Patient Education 2016 [place]. Theophil Thivierge M.D.

## 2015-10-24 ENCOUNTER — Other Ambulatory Visit: Payer: Self-pay | Admitting: Internal Medicine

## 2015-10-24 NOTE — Telephone Encounter (Signed)
Sent to the pharmacy by e-scribe. 

## 2015-11-06 ENCOUNTER — Other Ambulatory Visit: Payer: Self-pay | Admitting: Internal Medicine

## 2015-11-06 NOTE — Telephone Encounter (Signed)
Sent to the pharmacy by e-scribe. 

## 2015-11-07 ENCOUNTER — Ambulatory Visit (INDEPENDENT_AMBULATORY_CARE_PROVIDER_SITE_OTHER): Payer: 59 | Admitting: Family Medicine

## 2015-11-07 ENCOUNTER — Encounter: Payer: Self-pay | Admitting: Family Medicine

## 2015-11-07 ENCOUNTER — Telehealth: Payer: Self-pay | Admitting: Internal Medicine

## 2015-11-07 VITALS — BP 110/60 | HR 84 | Temp 98.7°F | Wt 187.0 lb

## 2015-11-07 DIAGNOSIS — A084 Viral intestinal infection, unspecified: Secondary | ICD-10-CM | POA: Diagnosis not present

## 2015-11-07 DIAGNOSIS — R509 Fever, unspecified: Secondary | ICD-10-CM

## 2015-11-07 LAB — POCT INFLUENZA A/B
INFLUENZA A, POC: NEGATIVE
Influenza B, POC: NEGATIVE

## 2015-11-07 MED ORDER — ONDANSETRON 8 MG PO TBDP: 8.0000 mg | ORAL_TABLET | Freq: Three times a day (TID) | ORAL | Status: DC | PRN

## 2015-11-07 NOTE — Telephone Encounter (Signed)
FYI!  Pt coming to see Dr. Yong Channel

## 2015-11-07 NOTE — Progress Notes (Signed)
Garret Reddish, MD  Subjective:  Holly Beasley is a 65 y.o. year old very pleasant female patient who presents for/with See problem oriented charting ROS- no diarrhea. No blurry vision. No chest pain or shortness of breath. Mild abdominal cramping  Past Medical History- hypertension, hyperlipidemia, diabetes  Medications- reviewed and updated Current Outpatient Prescriptions  Medication Sig Dispense Refill  . atorvastatin (LIPITOR) 40 MG tablet TAKE 1 TABLET (40 MG TOTAL) BY MOUTH DAILY. 90 tablet 1  . CALCIUM PO Take 500 mg by mouth 2 (two) times daily.    . carvedilol (COREG) 12.5 MG tablet TAKE 1 TABLET TWICE A DAY WITH A MEAL 180 tablet 2  . Cholecalciferol (VITAMIN D PO) Take 500 Units by mouth daily.    . ciclesonide (OMNARIS) 50 MCG/ACT nasal spray Place 2 sprays into both nostrils daily. 12.5 g prn  . cycloSPORINE (RESTASIS) 0.05 % ophthalmic emulsion Place 1 drop into both eyes at bedtime.    . DULoxetine (CYMBALTA) 60 MG capsule Take 120 mg by mouth at bedtime.     Marland Kitchen glucose blood (ACCU-CHEK AVIVA PLUS) test strip Use to check blood glucose 1-2 times daily 100 each 12  . losartan (COZAAR) 100 MG tablet TAKE 1 TABLET (100 MG TOTAL) BY MOUTH DAILY. 90 tablet 2  . metFORMIN (GLUCOPHAGE-XR) 500 MG 24 hr tablet TAKE 3 TABLETS EVERY DAY 270 tablet 1  . montelukast (SINGULAIR) 10 MG tablet TAKE 1 TABLET (10 MG TOTAL) BY MOUTH AT BEDTIME. 90 tablet 1  . MULTIPLE VITAMIN PO Take 1 tablet by mouth daily.    . Omega-3 Fatty Acids (FISH OIL) 1000 MG CAPS Take 1 capsule by mouth daily.    Marland Kitchen omeprazole (PRILOSEC) 40 MG capsule TAKE 1 CAPSULE BY MOUTH ONCE DAILY 90 capsule 2  . Blood Glucose Monitoring Suppl (ACCU-CHEK AVIVA PLUS) W/DEVICE KIT Use to check blood glucose 1-2 times daily (Patient not taking: Reported on 11/07/2015) 1 kit 0     Objective: BP 110/60 mmHg  Pulse 84  Temp(Src) 98.7 F (37.1 C)  Wt 187 lb (84.823 kg) Gen: NAD, appears fatigued, wearing mask TM normal,  pharynx mildly erythematous, nares normal. Mucous membranes are moist. CV: RRR no murmurs rubs or gallops Lungs: CTAB no crackles, wheeze, rhonchi Abdomen: soft/nontender/nondistended/normal bowel sounds. No rebound or guarding.  Ext: no edema, mild erythema with unclear borders on left pretibial area Skin: warm, dry Neuro: grossly normal, moves all extremities  Results for orders placed or performed in visit on 11/07/15 (from the past 24 hour(s))  POC Influenza A/B     Status: None   Collection Time: 11/07/15 11:50 AM  Result Value Ref Range   Influenza A, POC Negative Negative   Influenza B, POC Negative Negative   Assessment/Plan:  Fever/Chills/nausea S: Monday night 3 hours of chills and then sweating for another 2-3 hours after that. Felt pretty good the next morning and then 5 pm got the chills again and temperature was 102.2 then started sweating. Also had some nausea and near vomiting. Took 1 of husbands zofran and that helped.Husband has been sick with vomiting but no diarrhea- treated by Dr. Sarajane Jews with zofran- thought to be viral gastroenteritis. Took excedrin for pretty bad headache yesterday and has had some intermittent headache but no neck stiffness.  This morning with some minor chills. Did have a flu shot this year in September.fatigue and some body aches. Also noted some mild redness on her lower leg today but has history of sensitive skin A/P:  suspect viral illness including gastroenteritis. We will treat symptomatically with zofran for the nausea though appears to be improving. I did ask patient to ask spot on left leg closely and if expanding redness to return to care (possible cellulitis but not particularly warm or tender and could be viral rash).   Return precautions advised.   Orders Placed This Encounter  Procedures  . POC Influenza A/B   Meds ordered this encounter  Medications  . ondansetron (ZOFRAN-ODT) 8 MG disintegrating tablet    Sig: Take 1 tablet (8 mg  total) by mouth every 8 (eight) hours as needed for nausea or vomiting.    Dispense:  20 tablet    Refill:  0

## 2015-11-07 NOTE — Telephone Encounter (Signed)
Patient Name: [name]  DOB: Apr 05, 1951    Initial Comment Caller states she had chills on Monday night, and than sweating. 102.2, temp. Her husband had a stomach virus. Very Fatigue. She took one of her husbands anti nausea pill, and it helped.   Nurse Assessment  Nurse: Raphael Gibney, RN, Vanita Ingles Date/Time (Eastern Time): 11/07/2015 8:16:29 AM  Confirm and document reason for call. If symptomatic, describe symptoms. You must click the next button to save text entered. ---Caller states she had chills on Monday night for 3 hrs and then she sweated. Had chills yesterday and sweating. Temp 102.2 yesterday. Had some nausea yesterday and took her husband's Zofran and felt better Had some abd pain last night and no pain now. No nausea now. No vomiting or diarrhea. Does not think she has a fever now.  Has the patient traveled out of the country within the last 30 days? ---No  Does the patient have any new or worsening symptoms? ---Yes  Will a triage be completed? ---Yes  Related visit to physician within the last 2 weeks? ---No  Does the PT have any chronic conditions? (i.e. diabetes, asthma, etc.) ---Yes  List chronic conditions. ---prediabetes; HTN  Is this a behavioral health or substance abuse call? ---No     Guidelines    Guideline Title Affirmed Question Affirmed Notes  Weakness (Generalized) and Fatigue [1] MODERATE weakness (i.e., interferes with work, school, normal activities) AND [2] persists > 3 days    Final Disposition User   See Physician within [address] Hours McClave, RN, Vera    Comments  Appt scheduled for 11:15 am 11/07/15 with Dr. Garret Reddish   Referrals  REFERRED TO PCP OFFICE   Disagree/Comply: Comply

## 2015-11-07 NOTE — Patient Instructions (Addendum)
Results for orders placed or performed in visit on 11/07/15 (from the past 24 hour(s))  POC Influenza A/B     Status: None   Collection Time: 11/07/15 11:50 AM  Result Value Ref Range   Influenza A, POC Negative Negative   Influenza B, POC Negative Negative   Suspect you are dealing with a viral illness- a lot of these have been causing fever and GI symptoms. Usually these have been lasting about 72 hours. You are likely infectious until 24 hours last fever or vomiting or diarrhea. If you are still having issues on Friday reasonable to return to care for evaluation especially if having fever

## 2015-11-28 ENCOUNTER — Other Ambulatory Visit: Payer: Self-pay | Admitting: Internal Medicine

## 2015-11-28 NOTE — Telephone Encounter (Signed)
Sent to the pharmacy by e-scribe. 

## 2015-12-19 ENCOUNTER — Other Ambulatory Visit: Payer: Self-pay | Admitting: Internal Medicine

## 2015-12-19 NOTE — Telephone Encounter (Signed)
Sent to the pharmacy by e-scribe. 

## 2016-01-07 ENCOUNTER — Ambulatory Visit (INDEPENDENT_AMBULATORY_CARE_PROVIDER_SITE_OTHER): Payer: 59 | Admitting: Family Medicine

## 2016-01-07 ENCOUNTER — Encounter: Payer: Self-pay | Admitting: Family Medicine

## 2016-01-07 VITALS — BP 126/67 | HR 70 | Temp 98.6°F | Ht 67.75 in | Wt 185.0 lb

## 2016-01-07 DIAGNOSIS — J069 Acute upper respiratory infection, unspecified: Secondary | ICD-10-CM | POA: Diagnosis not present

## 2016-01-07 NOTE — Progress Notes (Signed)
   Subjective:    Patient ID: Holly Beasley, female    DOB: 11-05-1950, 65 y.o.   MRN: [id number]  HPI Here for 5 days of fever to 100.6 degrees, stuffy head, and a dry cough. No ST. Last night she was able to sleep all night without coughing. Today she feels much better. Using Dayquil and Nyquil.   Review of Systems  Constitutional: Negative.   HENT: Positive for congestion and postnasal drip. Negative for sinus pressure and sore throat.   Eyes: Negative.   Respiratory: Positive for cough.        Objective:   Physical Exam  Constitutional: She appears well-developed and well-nourished.  HENT:  Right Ear: External ear normal.  Left Ear: External ear normal.  Nose: Nose normal.  Mouth/Throat: Oropharynx is clear and moist.  Eyes: Conjunctivae are normal.  Neck: Neck supple. No thyromegaly present.  Pulmonary/Chest: Effort normal and breath sounds normal.  Lymphadenopathy:    She has no cervical adenopathy.          Assessment & Plan:  Viral URI, improving. Recheck prn

## 2016-01-07 NOTE — Progress Notes (Signed)
Pre visit review using our clinic review tool, if applicable. No additional management support is needed unless otherwise documented below in the visit note. 

## 2016-01-17 ENCOUNTER — Other Ambulatory Visit: Payer: Self-pay | Admitting: Family Medicine

## 2016-01-17 MED ORDER — DULOXETINE HCL 60 MG PO CPEP: 120.0000 mg | ORAL_CAPSULE | Freq: Every day | ORAL | Status: DC

## 2016-01-17 NOTE — Telephone Encounter (Signed)
Sent to the pharmacy by e-scribe. 

## 2016-04-02 ENCOUNTER — Other Ambulatory Visit: Payer: Self-pay | Admitting: Internal Medicine

## 2016-04-07 ENCOUNTER — Other Ambulatory Visit (INDEPENDENT_AMBULATORY_CARE_PROVIDER_SITE_OTHER): Payer: 59

## 2016-04-07 DIAGNOSIS — Z113 Encounter for screening for infections with a predominantly sexual mode of transmission: Secondary | ICD-10-CM

## 2016-04-07 DIAGNOSIS — E119 Type 2 diabetes mellitus without complications: Secondary | ICD-10-CM

## 2016-04-07 LAB — HEMOGLOBIN A1C: Hgb A1c MFr Bld: 6.9 % — ABNORMAL HIGH (ref 4.6–6.5)

## 2016-04-08 LAB — HEPATITIS C ANTIBODY: HCV AB: NEGATIVE

## 2016-04-08 LAB — HIV ANTIBODY (ROUTINE TESTING W REFLEX): HIV 1&2 Ab, 4th Generation: NONREACTIVE

## 2016-04-14 ENCOUNTER — Encounter: Payer: Self-pay | Admitting: Internal Medicine

## 2016-04-14 ENCOUNTER — Ambulatory Visit (INDEPENDENT_AMBULATORY_CARE_PROVIDER_SITE_OTHER): Payer: 59 | Admitting: Internal Medicine

## 2016-04-14 VITALS — BP 156/70 | Temp 97.7°F | Ht 67.75 in | Wt 192.7 lb

## 2016-04-14 DIAGNOSIS — E119 Type 2 diabetes mellitus without complications: Secondary | ICD-10-CM

## 2016-04-14 DIAGNOSIS — M79606 Pain in leg, unspecified: Secondary | ICD-10-CM | POA: Diagnosis not present

## 2016-04-14 DIAGNOSIS — I10 Essential (primary) hypertension: Secondary | ICD-10-CM | POA: Diagnosis not present

## 2016-04-14 DIAGNOSIS — Z23 Encounter for immunization: Secondary | ICD-10-CM

## 2016-04-14 DIAGNOSIS — E785 Hyperlipidemia, unspecified: Secondary | ICD-10-CM

## 2016-04-14 NOTE — Patient Instructions (Addendum)
Trial off of the Lipitor  For at least 2-3 weeks to see if  Leg pains get better  And then re challenge Thank you. In a message on my chart or contact us otherwise about how this is doing .   Will plan fu depending  How doing  diabetes is at goal at this time  Bp is up today make sure  Below 140/90   For control   6 months  Wellness aannual   Fu  Earlier  Depending on   How the legs are doing.

## 2016-04-14 NOTE — Progress Notes (Signed)
Pre visit review using our clinic review tool, if applicable. No additional management support is needed unless otherwise documented below in the visit note.  Chief Complaint  Patient presents with  . Follow-up    legs hurt  . Diabetes  . Hypertension  . Hyperlipidemia    HPI: Holly Beasley 64 y.o.  Fu Chronic disease management BP had been ok no change  DM  No change meds  Recently her legs hurt she does have arthritis in her hip and knee but this is down her leg. States her legs feel tired. Worse after getting up from a chair. No specific weakness otherwise.  ROS: See pertinent positives and negatives per HPI. No burning pain weakness falling. No change in medicines.  Past Medical History  Diagnosis Date  . Osteopenia     dexa -1.7 h -1.35   . Scoliosis     leg length discrepancy  . Allergy   . Depression   . Osteoarthritis   . Skin cancer     basal cell nose  . History of ETOH abuse     recovering  in remission  . Hyperlipidemia   . Dry eyes   . Varicose veins   . Hypertension   . Sleep apnea     did not meet criteria for cpap-uses a mouthpiece  . GERD (gastroesophageal reflux disease)   . Wears contact lenses   . Diabetes mellitus without complication ([place])     Family History  Problem Relation Age of Onset  . Arthritis Brother   . Alcohol abuse Brother   . Lymphoma Mother   . Diabetes Mother   . Hyperlipidemia Mother   . Heart failure Father   . Hyperlipidemia Father   . Alcohol abuse Brother     Social History   Social History  . Marital Status: Married    Spouse Name: N/A  . Number of Children: N/A  . Years of Education: N/A   Social History Main Topics  . Smoking status: Never Smoker   . Smokeless tobacco: Never Used  . Alcohol Use: No     Comment: recovering-1983  . Drug Use: No  . Sexual Activity: Not Asked   Other Topics Concern  . 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    Retired Conservation officer, nature   Married   ETOH recovering   [place] 2    Has family iin [place]             Outpatient Prescriptions Prior to Visit  Medication Sig Dispense Refill  . atorvastatin (LIPITOR) 40 MG tablet TAKE 1 TABLET (40 MG TOTAL) BY MOUTH DAILY. 90 tablet 1  . Blood Glucose Monitoring Suppl (ACCU-CHEK AVIVA PLUS) W/DEVICE KIT Use to check blood glucose 1-2 times daily 1 kit 0  . carvedilol (COREG) 12.5 MG tablet TAKE 1 TABLET TWICE A DAY WITH A MEAL 180 tablet 2  . Cholecalciferol (VITAMIN D PO) Take 500 Units by mouth daily.    . ciclesonide (OMNARIS) 50 MCG/ACT nasal spray Place 2 sprays into both nostrils daily. 12.5 g prn  . cycloSPORINE (RESTASIS) 0.05 % ophthalmic emulsion Place 1 drop into both eyes at bedtime.    . DULoxetine (CYMBALTA) 60 MG capsule Take 2 capsules (120 mg total) by mouth at bedtime. 180 capsule 0  . glucose blood (ACCU-CHEK AVIVA PLUS) test strip Use to check blood glucose  1-2 times daily 100 each 12  . losartan (COZAAR) 100 MG tablet TAKE 1 TABLET (100 MG TOTAL) BY MOUTH DAILY. 90 tablet 2  . metFORMIN (GLUCOPHAGE-XR) 500 MG 24 hr tablet TAKE 3 TABLETS EVERY DAY 270 tablet 1  . montelukast (SINGULAIR) 10 MG tablet TAKE 1 TABLET (10 MG TOTAL) BY MOUTH AT BEDTIME. 90 tablet 3  . MULTIPLE VITAMIN PO Take 1 tablet by mouth daily.    Marland Kitchen omeprazole (PRILOSEC) 40 MG capsule TAKE 1 CAPSULE BY MOUTH ONCE DAILY 90 capsule 2  . CALCIUM PO Take 500 mg by mouth 2 (two) times daily.    . Omega-3 Fatty Acids (FISH OIL) 1000 MG CAPS Take 1 capsule by mouth daily.    . ondansetron (ZOFRAN-ODT) 8 MG disintegrating tablet Take 1 tablet (8 mg total) by mouth every 8 (eight) hours as needed for nausea or vomiting. (Patient not taking: Reported on 01/07/2016) 20 tablet 0   No facility-administered medications prior to visit.     EXAM:  BP 156/70 mmHg  Temp(Src) 97.7 F (36.5 C) (Oral)  Ht 5' 7.75" (1.721 m)  Wt 192 lb 11.2 oz (87.408 kg)  BMI  29.51 kg/m2  Body mass index is 29.51 kg/(m^2).  GENERAL: vitals reviewed and listed above, alert, oriented, appears well hydrated and in no acute distress HEENT: atraumatic, conjunctiva  clear, no obvious abnormalities on inspection of external nose and ears  CV: HRRR, no clubbing cyanosis or  peripheral edema nl cap refill  MS: moves all extremities without noticeable focal  Abnormality able to get up from chair without assistance a little bit cautious no focal weakness. Left lower extremity slightly larger than right this is not new she has had a venous procedure. PSYCH: pleasant and cooperative, no obvious depression or anxiety Lab Results  Component Value Date   WBC 7.8 10/02/2015   HGB 12.2 10/02/2015   HCT 36.5 10/02/2015   PLT 333.0 10/02/2015   GLUCOSE 170* 10/02/2015   CHOL 143 10/02/2015   TRIG 117.0 10/02/2015   HDL 50.70 10/02/2015   LDLDIRECT 95.1 08/23/2014   LDLCALC 69 10/02/2015   ALT 18 10/02/2015   AST 16 10/02/2015   NA 140 10/02/2015   K 4.2 10/02/2015   CL 103 10/02/2015   CREATININE 0.76 10/02/2015   BUN 10 10/02/2015   CO2 29 10/02/2015   TSH 2.54 10/02/2015   HGBA1C 6.9* 04/07/2016   MICROALBUR <0.7 10/02/2015   BP Readings from Last 3 Encounters:  04/14/16 156/70  01/07/16 126/67  11/07/15 110/60   Wt Readings from Last 3 Encounters:  04/14/16 192 lb 11.2 oz (87.408 kg)  01/07/16 185 lb (83.915 kg)  11/07/15 187 lb (84.823 kg)     ASSESSMENT AND PLAN:  Discussed the following assessment and plan:  Controlled type 2 diabetes mellitus without complication, without long-term current use of insulin (HCC)  Essential hypertension  HLD (hyperlipidemia)  Leg pain, diffuse, unspecified laterality - bilateral poss from back or  med  see  plan  Need for 23-polyvalent pneumococcal polysaccharide vaccine - Plan: Pneumococcal polysaccharide vaccine 23-valent greater than or equal to 2yo subcutaneous/IM Health Maintenance Due  Topic Date Due  .  PAP SMEAR  11/13/2012  . DEXA SCAN  03/05/2016  Agree that we should have a trial off of the statin and she will back with [place] with blood pressure readings and clinical progress on my chart. Otherwise are OV. We can switch to a different statin if needed. Is it is certainly possible  that her spinal stenosis could be acting out. Although that tended to be unilateral symptoms.  -Patient advised to return or notify health care team  if symptoms worsen ,persist or new concerns arise.  Patient Instructions  Trial off of the Lipitor  For at least 2-3 weeks to see if  Leg pains get better  And then re challenge Thank you. In a message on my chart or contact us otherwise about how this is doing .   Will plan fu depending  How doing  diabetes is at goal at this time  Bp is up today make sure  Below 140/90   For control   6 months  Wellness aannual   Fu  Earlier  Depending on   How the legs are doing.      Standley Brooking. Jamecia Lerman M.D.

## 2016-04-17 ENCOUNTER — Other Ambulatory Visit: Payer: Self-pay | Admitting: Internal Medicine

## 2016-04-18 NOTE — Telephone Encounter (Signed)
Sent to the pharmacy by e-scribe. 

## 2016-05-22 ENCOUNTER — Other Ambulatory Visit: Payer: Self-pay | Admitting: Internal Medicine

## 2016-05-22 NOTE — Telephone Encounter (Signed)
Sent to the pharmacy by e-scribe. 

## 2016-06-13 ENCOUNTER — Other Ambulatory Visit: Payer: Self-pay | Admitting: Internal Medicine

## 2016-06-13 NOTE — Telephone Encounter (Signed)
Sent to the pharmacy by e-scribe. 

## 2016-07-09 ENCOUNTER — Ambulatory Visit (INDEPENDENT_AMBULATORY_CARE_PROVIDER_SITE_OTHER): Payer: 59 | Admitting: Family Medicine

## 2016-07-09 DIAGNOSIS — Z23 Encounter for immunization: Secondary | ICD-10-CM

## 2016-07-14 ENCOUNTER — Other Ambulatory Visit: Payer: Self-pay | Admitting: Internal Medicine

## 2016-07-14 DIAGNOSIS — Z1231 Encounter for screening mammogram for malignant neoplasm of breast: Secondary | ICD-10-CM

## 2016-07-16 ENCOUNTER — Other Ambulatory Visit: Payer: Self-pay | Admitting: Internal Medicine

## 2016-07-17 NOTE — Telephone Encounter (Signed)
Sent to the pharmacy by e-scribe for 90 days.  Pt has upcoming appt on 10/17/16 for yearly.

## 2016-07-28 ENCOUNTER — Other Ambulatory Visit: Payer: Self-pay | Admitting: Internal Medicine

## 2016-07-29 LAB — HM DIABETES EYE EXAM

## 2016-07-30 ENCOUNTER — Ambulatory Visit
Admission: RE | Admit: 2016-07-30 | Discharge: 2016-07-30 | Disposition: A | Discharge status: Home / Self Care | Payer: 59 | Source: Ambulatory Visit | Attending: Internal Medicine | Admitting: Internal Medicine

## 2016-07-30 DIAGNOSIS — Z1231 Encounter for screening mammogram for malignant neoplasm of breast: Secondary | ICD-10-CM

## 2016-07-30 NOTE — Telephone Encounter (Signed)
Sent to the pharmacy by e-scribe for 90 days.  Pt has cpx on 10/17/16.

## 2016-08-08 ENCOUNTER — Encounter: Payer: Self-pay | Admitting: Family Medicine

## 2016-08-15 ENCOUNTER — Emergency Department (HOSPITAL_COMMUNITY)
Admission: EM | Admit: 2016-08-15 | Discharge: 2016-08-15 | Disposition: A | Discharge status: Home / Self Care | Payer: 59 | Attending: Emergency Medicine | Admitting: Emergency Medicine

## 2016-08-15 ENCOUNTER — Ambulatory Visit (INDEPENDENT_AMBULATORY_CARE_PROVIDER_SITE_OTHER): Payer: 59 | Admitting: Internal Medicine

## 2016-08-15 ENCOUNTER — Encounter (HOSPITAL_COMMUNITY): Payer: Self-pay | Admitting: Emergency Medicine

## 2016-08-15 ENCOUNTER — Encounter: Payer: Self-pay | Admitting: Internal Medicine

## 2016-08-15 VITALS — HR 150 | Resp 16

## 2016-08-15 DIAGNOSIS — I10 Essential (primary) hypertension: Secondary | ICD-10-CM | POA: Diagnosis not present

## 2016-08-15 DIAGNOSIS — R531 Weakness: Secondary | ICD-10-CM

## 2016-08-15 DIAGNOSIS — E119 Type 2 diabetes mellitus without complications: Secondary | ICD-10-CM | POA: Insufficient documentation

## 2016-08-15 DIAGNOSIS — Z85828 Personal history of other malignant neoplasm of skin: Secondary | ICD-10-CM | POA: Insufficient documentation

## 2016-08-15 DIAGNOSIS — I471 Supraventricular tachycardia, unspecified: Secondary | ICD-10-CM

## 2016-08-15 DIAGNOSIS — Z7984 Long term (current) use of oral hypoglycemic drugs: Secondary | ICD-10-CM | POA: Insufficient documentation

## 2016-08-15 DIAGNOSIS — R002 Palpitations: Secondary | ICD-10-CM | POA: Diagnosis present

## 2016-08-15 DIAGNOSIS — R000 Tachycardia, unspecified: Secondary | ICD-10-CM | POA: Diagnosis not present

## 2016-08-15 HISTORY — DX: Supraventricular tachycardia: I47.1

## 2016-08-15 HISTORY — DX: Supraventricular tachycardia, unspecified: I47.10

## 2016-08-15 LAB — CBC
HEMATOCRIT: 36.4 % (ref 36.0–46.0)
Hemoglobin: 12.3 g/dL (ref 12.0–15.0)
MCH: 31 pg (ref 26.0–34.0)
MCHC: 33.8 g/dL (ref 30.0–36.0)
MCV: 91.7 fL (ref 78.0–100.0)
Platelets: 268 10*3/uL (ref 150–400)
RBC: 3.97 MIL/uL (ref 3.87–5.11)
RDW: 12.8 % (ref 11.5–15.5)
WBC: 8 10*3/uL (ref 4.0–10.5)

## 2016-08-15 LAB — BASIC METABOLIC PANEL
Anion gap: 9 (ref 5–15)
BUN: 12 mg/dL (ref 6–20)
CO2: 22 mmol/L (ref 22–32)
Calcium: 8.8 mg/dL — ABNORMAL LOW (ref 8.9–10.3)
Chloride: 104 mmol/L (ref 101–111)
Creatinine, Ser: 0.87 mg/dL (ref 0.44–1.00)
GFR calc Af Amer: >60 mL/min (ref >60)
Glucose, Bld: 240 mg/dL — ABNORMAL HIGH (ref 65–99)
Potassium: 4.3 mmol/L (ref 3.5–5.1)
Sodium: 135 mmol/L (ref 135–145)

## 2016-08-15 LAB — POCT GLUCOSE (DEVICE FOR HOME USE): POC Glucose: 251 mg/dl — AB (ref 70–99)

## 2016-08-15 NOTE — Progress Notes (Signed)
Chief Complaint  Patient presents with  . Dizziness, Lightheaded, SOB, Sweating    Started this morning.    HPI: Holly Beasley 65 y.o. is a patient who came in acutely and was called to be seen emergently at lunch hour  because she presented ill to the front desk in the  office.  Triage felt  to be emergent. She states that she was in her usual state of health until earlier today when she was speaking to a contractor and suddenly felt very weak. She went and had some fluids still felt bad and was going to go to her gym class. When she got they are told her coach about it he gave her a cool cloth and told her to come here. Her husband brought her to the health center here. She complains of feeling weak diaphoretic although downplays that because she gets that a lot minimally short of breath no vomiting slight nausea no specific headache fever UTI symptoms or cough. She did take her blood pressure medicine today which included carvedilol. No specific chest pain mild short of breath continues her sleep apnea machine last night could've a problem with the mask. ROS: See pertinent positives and negatives per HPI.  Past Medical History:  Diagnosis Date  . Allergy   . Depression   . Depression   . Diabetes mellitus without complication ([place])   . Dry eyes   . GERD (gastroesophageal reflux disease)   . History of ETOH abuse    recovering  in remission  . Hyperlipidemia   . Hypertension   . Osteoarthritis   . Osteopenia    dexa -1.7 h -1.35   . Scoliosis    leg length discrepancy  . Skin cancer    basal cell nose  . Sleep apnea    did not meet criteria for cpap-uses a mouthpiece  . Varicose veins   . Wears contact lenses     Family History  Problem Relation Age of Onset  . Lymphoma Mother   . Diabetes Mother   . Hyperlipidemia Mother   . Heart failure Father   . Hyperlipidemia Father   . Arthritis Brother   . Alcohol abuse Brother   . Alcohol abuse Brother     Social  History   Social History  . Marital status: Married    Spouse name: N/A  . Number of children: N/A  . Years of education: N/A   Social History Main Topics  . Smoking status: Never Smoker  . Smokeless tobacco: Never Used  . Alcohol use No     Comment: recovering-1983  . Drug use: No  . Sexual activity: Not Asked   Other Topics Concern  . 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 atorvastatin (LIPITOR) 40 MG tablet TAKE 1 TABLET (40 MG TOTAL) BY MOUTH DAILY. 90 tablet 1  . Blood Glucose Monitoring Suppl (ACCU-CHEK AVIVA PLUS) W/DEVICE KIT Use to check blood glucose 1-2 times daily (Patient not taking: Reported on 08/15/2016) 1 kit 0  . carvedilol (COREG) 12.5 MG tablet TAKE 1 TABLET TWICE A DAY WITH A MEAL  180 tablet 0  . Cholecalciferol (VITAMIN D PO) Take 500 Units by mouth daily.    . ciclesonide (OMNARIS) 50 MCG/ACT nasal spray Place 2 sprays into both nostrils daily. 12.5 g prn  . cycloSPORINE (RESTASIS) 0.05 % ophthalmic emulsion Place 1 drop into both eyes at bedtime.    . DULoxetine (CYMBALTA) 60 MG capsule TAKE 2 CAPSULES BY MOUTH AT BEDTIME 180 capsule 1  . glucose blood (ACCU-CHEK AVIVA PLUS) test strip Use to check blood glucose 1-2 times daily (Patient not taking: Reported on 08/15/2016) 100 each 12  . losartan (COZAAR) 100 MG tablet TAKE 1 TABLET (100 MG TOTAL) BY MOUTH DAILY. 90 tablet 0  . metFORMIN (GLUCOPHAGE-XR) 500 MG 24 hr tablet TAKE 3 TABLETS EVERY DAY 270 tablet 1  . montelukast (SINGULAIR) 10 MG tablet TAKE 1 TABLET (10 MG TOTAL) BY MOUTH AT BEDTIME. 90 tablet 3  . MULTIPLE VITAMIN PO Take 1 tablet by mouth daily.    Marland Kitchen omeprazole (PRILOSEC) 40 MG capsule TAKE 1 CAPSULE BY MOUTH ONCE DAILY 90 capsule 0   No  facility-administered medications prior to visit.      EXAM:  Pulse (!) 150   Resp 16   SpO2 94% Comment: 99 % with O2  There is no height or weight on file to calculate BMI. rsp 16  No retractions  GENERAL: Diaphoretic non-to kit neck but perioral cyanosis noted and initial assessment but mentation was normal she appear to be pale. HEENT grossly normal atraumatic speech is normal. Neck is supple chest clear to auscultation on the side views cardiovascular S1-S2 very fast heart rate between 140 and 160 and regular no obvious murmur. Peripheral pulses radial appears to be full without cool extremities. Blood pressure unobtainable on auditory. Abdomen soft without organomegaly guarding or rebound extremities negative CCE noted. Neurologic is grossly normal no obvious focal difficulties.  Cognition seems good speech normal  Skin no bruising or obv ecchymosis  Patient was given oxygen by mask still slightly dusky but pinker. EKG shows supraventricular tachycardia see EKG.  She states she had taken glucose tablets before blood sugar reflecting now no history of hypoglycemia. Lab Results  Component Value Date   WBC 8.0 08/15/2016   HGB 12.3 08/15/2016   HCT 36.4 08/15/2016   PLT 268 08/15/2016   GLUCOSE 240 (H) 08/15/2016   CHOL 143 10/02/2015   TRIG 117.0 10/02/2015   HDL 50.70 10/02/2015   LDLDIRECT 95.1 08/23/2014   LDLCALC 69 10/02/2015   ALT 18 10/02/2015   AST 16 10/02/2015   NA 135 08/15/2016   K 4.3 08/15/2016   CL 104 08/15/2016   CREATININE 0.87 08/15/2016   BUN 12 08/15/2016   CO2 22 08/15/2016   TSH 2.54 10/02/2015   HGBA1C 6.9 (H) 04/07/2016   MICROALBUR <0.7 10/02/2015   cbg in 200 range ( after she  took  glucose tabs) ASSESSMENT AND PLAN:  Discussed the following assessment and plan:  Tachycardia - spell with diaphoresis and clincal lip cyanosis  cause TBD.  - Plan: EKG 12-Lead, POCT Glucose (Device for Home Use)  Weakness  Controlled type 2 diabetes  mellitus without complication, without long-term current use of insulin (HCC) - no hx of low   Essential hypertension - took meds today   Acute critical illness with tachycardia. With attendance with patient until  EMS  arrived and transported. - Patient Instructions  To ed by ems   Total visit 30mns > 50% spent attending coordinating care as indicated in  above note and in instructions to patient .   Standley Brooking. [place] M.D.

## 2016-08-15 NOTE — ED Triage Notes (Signed)
Per [facility] patient from PCP for generalized weakness and altered level of consciousness.  Patient was preparing for a workout when she felt like her heart was beating rapidly and she felt weak.  She went to her PCP who called 911 and told EMS they were unable to measure a blood pressure nor obtain and EKG.  On EMS arrival patient was found to be in SVT.  Patient given 6 of adenosine, patient returned to normal sinus.  Patient normal sinus with rate 89 on arrival to ED.  Patient alert and oriented at this time.

## 2016-08-15 NOTE — Patient Instructions (Signed)
To ed by ems

## 2016-08-15 NOTE — ED Provider Notes (Signed)
[facility] DEPT Provider Note   CSN: [id number] Arrival date & time: 08/15/16  1348     History   Chief Complaint Chief Complaint  Patient presents with  . Resolved SVT    HPI Holly Beasley is a 65 y.o. female.   Palpitations   This is a new problem. Episode onset: sx started today. Associated symptoms include diaphoresis and shortness of breath. Pertinent negatives include no fever, no abdominal pain, no nausea and no vomiting. Associated symptoms comments: Diaphoresis .  Patient started having lightheadedness and dyspnea today. She noticed it especially when she was getting ready for work out. Patient ended going to her primary care doctor for evaluation. At the office they were unable to measure a blood pressure obtained an EKG. They noted that she was tachycardic. They called 911. EMS arrived and when they placed the patient on the monitor they noted that she was in a narrow complex tachycardia consistent with SVT. Patient was given 6 mg of adenosine and she returned into a normal sinus rhythm. Patient is feeling much better now after the EMS treatment. She denies any complaints of chest pain or shortness of breath. She never had anything like this before in the past.  Past Medical History:  Diagnosis Date  . Allergy   . Depression   . Depression   . Diabetes mellitus without complication ([place])   . Dry eyes   . GERD (gastroesophageal reflux disease)   . History of ETOH abuse    recovering  in remission  . Hyperlipidemia   . Hypertension   . Osteoarthritis   . Osteopenia    dexa -1.7 h -1.35   . Scoliosis    leg length discrepancy  . Skin cancer    basal cell nose  . Sleep apnea    did not meet criteria for cpap-uses a mouthpiece  . Varicose veins   . Wears contact lenses     Patient Active Problem List   Diagnosis Date Noted  . HLD (hyperlipidemia) 04/14/2016  . Essential hypertension 12/18/2014  . Medication management 12/18/2014  . Varicose veins of  leg with complications 91/91/6606  . Left knee pain 09/21/2014  . Meniscus tear 09/21/2014  . Hx of bacterial sinusitis 08/28/2014  . Knee pain, acute 06/26/2014  . Leg edema, left 06/20/2014  . Varicose veins of lower extremities with other complications 00/45/9977  . Varicose veins 05/03/2014  . Inguinal adenopathy by ultrasound doppler 05/03/2014  . Left leg swelling 04/25/2014  . Diabetes mellitus type 2, controlled, without complications ([place]) 41/42/3953  . Visit for preventive health examination 06/07/2012  . Foot dermatitis 06/07/2012  . Shortness of breath 11/06/2011  . Nausea 11/06/2011  . Hand eczema 06/27/2011  . Prediabetes 03/20/2011  . Preventative health care 03/20/2011  . Dry eyes   . Headache(784.0) 12/30/2010  . Scoliosis   . Osteopenia   . History of ETOH abuse   . Sinusitis, chronic 02/27/2010  . LIBIDO, DECREASED 02/27/2010  . GERD 02/20/2009  . CONSTIPATION, CHRONIC 02/20/2009  . SKIN CANCER, HX OF 02/20/2009  . OSA (obstructive sleep apnea) 03/21/2008  . 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 HYPERTENSION 12/29/2007  . EXTERNAL HEMORRHOIDS WITH OTHER COMPLICATION 20/23/3435  . KNEE PAIN 12/29/2007  . UNEQUAL LEG LENGTH 12/29/2007  . SCOLIOSIS 12/29/2007  . COLONIC POLYPS, HX OF 10/27/2007  . HYPERLIPIDEMIA 06/15/2007  . ALCOHOL ABUSE, IN REMISSION 06/15/2007  . DEPRESSION 06/15/2007  . Allergic rhinitis 06/15/2007  .  Osteoarthritis of left knee 06/15/2007  . OSTEOPENIA 06/15/2007  . HYPERGLYCEMIA, FASTING 06/15/2007    Past Surgical History:  Procedure Laterality Date  . BREAST BIOPSY     2003-rt  . COLONOSCOPY W/ POLYPECTOMY     precancerous   2004  . KNEE ARTHROSCOPY WITH LATERAL MENISECTOMY Left 11/10/2014   Procedure: LEFT KNEE ARTHROSCOPY PARTIAL LATERAL MENISECTOMY CHONDROPLASTY;  Surgeon: Hessie Dibble, MD;  Location: [facility];  Service: Orthopedics;  Laterality: Left;  . NASAL SINUS SURGERY   11-13-2008   polyps removed  . skin cancer removal     basal cell face  . TONSILLECTOMY AND ADENOIDECTOMY  1967    OB History    No data available       Home Medications    Prior to Admission medications   Medication Sig Start Date End Date Taking? Authorizing Provider  atorvastatin (LIPITOR) 40 MG tablet TAKE 1 TABLET (40 MG TOTAL) BY MOUTH DAILY. 06/13/16  Yes Burnis Medin, MD  carvedilol (COREG) 12.5 MG tablet TAKE 1 TABLET TWICE A DAY WITH A MEAL 07/17/16  Yes Burnis Medin, MD  Cholecalciferol (VITAMIN D PO) Take 500 Units by mouth daily.   Yes Historical Provider, MD  cycloSPORINE (RESTASIS) 0.05 % ophthalmic emulsion Place 1 drop into both eyes at bedtime.   Yes Historical Provider, MD  DULoxetine (CYMBALTA) 60 MG capsule TAKE 2 CAPSULES BY MOUTH AT BEDTIME 04/18/16  Yes Burnis Medin, MD  losartan (COZAAR) 100 MG tablet TAKE 1 TABLET (100 MG TOTAL) BY MOUTH DAILY. 07/30/16  Yes Burnis Medin, MD  metFORMIN (GLUCOPHAGE-XR) 500 MG 24 hr tablet TAKE 3 TABLETS EVERY DAY 05/22/16  Yes Burnis Medin, MD  montelukast (SINGULAIR) 10 MG tablet TAKE 1 TABLET (10 MG TOTAL) BY MOUTH AT BEDTIME. 04/02/16  Yes Burnis Medin, MD  MULTIPLE VITAMIN PO Take 1 tablet by mouth daily.   Yes Historical Provider, MD  omeprazole (PRILOSEC) 40 MG capsule TAKE 1 CAPSULE BY MOUTH ONCE DAILY 07/30/16  Yes Burnis Medin, MD  Blood Glucose Monitoring Suppl (ACCU-CHEK AVIVA PLUS) W/DEVICE KIT Use to check blood glucose 1-2 times daily Patient not taking: Reported on 08/15/2016 01/23/15   Burnis Medin, MD  ciclesonide (OMNARIS) 50 MCG/ACT nasal spray Place 2 sprays into both nostrils daily. 12/18/14   Burnis Medin, MD  glucose blood (ACCU-CHEK AVIVA PLUS) test strip Use to check blood glucose 1-2 times daily Patient not taking: Reported on 08/15/2016 01/23/15   Burnis Medin, MD    Family History Family History  Problem Relation Age of Onset  . Lymphoma Mother   . Diabetes Mother   . Hyperlipidemia  Mother   . Heart failure Father   . Hyperlipidemia Father   . Arthritis Brother   . Alcohol abuse Brother   . Alcohol abuse Brother     Social History Social History  Substance Use Topics  . Smoking status: Never Smoker  . Smokeless tobacco: Never Used  . Alcohol use No     Comment: recovering-1983     Allergies   Amoxicillin; Cephalexin; and Sulfamethoxazole   Review of Systems Review of Systems  Constitutional: Positive for diaphoresis. Negative for fever.  Respiratory: Positive for shortness of breath.   Cardiovascular: Positive for palpitations.  Gastrointestinal: Negative for abdominal pain, nausea and vomiting.  All other systems reviewed and are negative.    Physical Exam Updated Vital Signs BP 137/89   Pulse 70   Temp  97.4 F (36.3 C) (Oral)   Resp 18   SpO2 96%   Physical Exam  Constitutional: She appears well-developed and well-nourished. No distress.  HENT:  Head: Normocephalic and atraumatic.  Right Ear: External ear normal.  Left Ear: External ear normal.  Eyes: Conjunctivae are normal. Right eye exhibits no discharge. Left eye exhibits no discharge. No scleral icterus.  Neck: Neck supple. No tracheal deviation present. No thyromegaly present.  Cardiovascular: Normal rate, regular rhythm and intact distal pulses.   Pulmonary/Chest: Effort normal and breath sounds normal. No stridor. No respiratory distress. She has no wheezes. She has no rales.  Abdominal: Soft. Bowel sounds are normal. She exhibits no distension. There is no tenderness. There is no rebound and no guarding.  Musculoskeletal: She exhibits no edema or tenderness.  Neurological: She is alert. She has normal strength. No cranial nerve deficit (no facial droop, extraocular movements intact, no slurred speech) or sensory deficit. She exhibits normal muscle tone. She displays no seizure activity. Coordination normal.  Skin: Skin is warm and dry. No rash noted.  Psychiatric: She has a  normal mood and affect.  Nursing note and vitals reviewed.    ED Treatments / Results  Labs (all labs ordered are listed, but only abnormal results are displayed) Labs Reviewed  BASIC METABOLIC PANEL - Abnormal; Notable for the following:       Result Value   Glucose, Bld 240 (*)    Calcium 8.8 (*)    All other components within normal limits  CBC    EKG  EKG Interpretation  Date/Time:  Friday August 15 2016 13:53:25 EDT Ventricular Rate:  87 PR Interval:    QRS Duration: 86 QT Interval:  372 QTC Calculation: 448 R Axis:   61 Text Interpretation:  Sinus rhythm Abnormal R-wave progression, early transition No significant change since last tracing Confirmed by Tempest Frankland  MD-J, Arther Heisler ([id number]) on 08/15/2016 2:34:00 PM       Procedures Procedures (including critical care time)  Medications Ordered in ED Medications - No data to display   Initial Impression / Assessment and Plan / ED Course  I have reviewed the triage vital signs and the nursing notes.  Pertinent labs & imaging results that were available during my care of the patient were reviewed by me and considered in my medical decision making (see chart for details).  Clinical Course  I reviewed the rhythm strips provided by EMS. Patient did have a narrow complex tachycardia suggestive of PSVT. The patient was monitored in the emergency room. She had no further episodes of tachycardia.  I discussed laboratory testing with the patient. She does have an elevated blood sugar. Patient will continue to monitor her blood sugars an outpatient. She will follow up with [facility] cardiology for further evaluation.  Final Clinical Impressions(s) / ED Diagnoses   Final diagnoses:  SVT (supraventricular tachycardia) ([place])    New Prescriptions New Prescriptions   No medications on file     Dorie Rank, MD 08/15/16 1629

## 2016-09-01 ENCOUNTER — Encounter: Payer: Self-pay | Admitting: Internal Medicine

## 2016-09-01 ENCOUNTER — Ambulatory Visit (INDEPENDENT_AMBULATORY_CARE_PROVIDER_SITE_OTHER): Payer: 59 | Admitting: Internal Medicine

## 2016-09-01 VITALS — BP 152/78 | HR 82 | Ht 69.0 in | Wt 197.0 lb

## 2016-09-01 DIAGNOSIS — R0602 Shortness of breath: Secondary | ICD-10-CM | POA: Diagnosis not present

## 2016-09-01 DIAGNOSIS — I119 Hypertensive heart disease without heart failure: Secondary | ICD-10-CM | POA: Diagnosis not present

## 2016-09-01 DIAGNOSIS — I471 Supraventricular tachycardia: Secondary | ICD-10-CM | POA: Diagnosis not present

## 2016-09-01 MED ORDER — CARVEDILOL 12.5 MG PO TABS: 18.7500 mg | ORAL_TABLET | Freq: Two times a day (BID) | ORAL | 3 refills | Status: DC

## 2016-09-01 NOTE — Progress Notes (Signed)
Electrophysiology Office Note   Date:  09/01/2016   ID:  Holly Beasley, DOB August 11, 1951, MRN [id number]  PCP:  Holly Dawson, MD  Cardiologist:  Holly Beasley previously (2013) Primary Electrophysiologist: Holly Grayer, MD    Chief Complaint  Patient presents with  . New Patient (Initial Visit)    SVT     History of Present Illness: Holly Beasley is a 65 y.o. female who presents today for electrophysiology evaluation.   She presents on referral from Holly Round, MD after recently being evaluated in [facility] ED with SVT.  She reports abrupt onset of tachypalpitations while talking to a Chief Strategy Officer.  She reports that her symptoms persisted for about 2 hours.  She reports that her legs were becoming weak and she was having diaphoresis.  She went to see Dr Regis Bill and had an ekg which documented SVT at 158 bpm.  EMS was called and she was transported [employer].  In transport, she was given adenosine which terminated her tachycardia.  She rather quickly returned to baseline. In retrospect, she has had similar episodes for 10-15 years.  Typically, she notices "short bursts" of tachycardia but has not previously had tachycardia detected.  She has rarely had presyncope with episodes.  She does have intermittent episodes of nausea and diaphoresis with SOB.  She thinks that this could be related.  She thinks that stress may be a trigger.  She has been treated with carvedilol for several years for performance anxiety.  She has reduced caffeine significantly.    Today, she denies symptoms of chest pain, sorthopnea, PND, lower extremity edema, claudication,  syncope, bleeding, or neurologic sequela. The patient is tolerating medications without difficulties and is otherwise without complaint today.    Past Medical History:  Diagnosis Date  . Allergy   . Depression   . Diabetes mellitus without complication ([place])   . Dry eyes   . GERD (gastroesophageal reflux disease)   . History of ETOH  abuse    recovering  in remission  . Hyperlipidemia   . Hypertension   . Osteoarthritis   . Osteopenia    dexa -1.7 h -1.35   . Scoliosis    leg length discrepancy  . Skin cancer    basal cell nose  . Sleep apnea    did not meet criteria for cpap-uses a mouthpiece  . SVT (supraventricular tachycardia) ([place]) 08/15/2016  . Varicose veins   . Wears contact lenses    Past Surgical History:  Procedure Laterality Date  . BREAST BIOPSY     2003-rt  . COLONOSCOPY W/ POLYPECTOMY     precancerous   2004  . KNEE ARTHROSCOPY WITH LATERAL MENISECTOMY Left 11/10/2014   Procedure: LEFT KNEE ARTHROSCOPY PARTIAL LATERAL MENISECTOMY CHONDROPLASTY;  Surgeon: Hessie Dibble, MD;  Location: [facility];  Service: Orthopedics;  Laterality: Left;  . NASAL SINUS SURGERY  11-13-2008   polyps removed  . skin cancer removal     basal cell face  . TONSILLECTOMY AND ADENOIDECTOMY  1967  . VENOUS ABLATION  2015   Dr Kellie Simmering     Current Outpatient Prescriptions  Medication Sig Dispense Refill  . carvedilol (COREG) 12.5 MG tablet Take 12.5 mg by mouth 2 (two) times daily with a meal.    . ciclesonide (OMNARIS) 50 MCG/ACT nasal spray Place 2 sprays into both nostrils daily as needed for allergies.    . Cyanocobalamin (VITAMIN B-12 PO) Take 5,000 mcg by mouth daily.    Marland Kitchen  cycloSPORINE (RESTASIS) 0.05 % ophthalmic emulsion Place 1 drop into both eyes at bedtime.    . DULoxetine (CYMBALTA) 60 MG capsule TAKE 2 CAPSULES BY MOUTH AT BEDTIME 180 capsule 1  . losartan (COZAAR) 50 MG tablet Take 50 mg by mouth daily.    . metFORMIN (GLUCOPHAGE-XR) 500 MG 24 hr tablet Take 500 mg by mouth 3 (three) times daily.     . montelukast (SINGULAIR) 10 MG tablet TAKE 1 TABLET (10 MG TOTAL) BY MOUTH AT BEDTIME. 90 tablet 3  . MULTIPLE VITAMIN PO Take 1 tablet by mouth daily.    . Omega-3 Fatty Acids (FISH OIL) 1000 MG CAPS Take 1 capsule by mouth daily.    Marland Kitchen omeprazole (PRILOSEC) 40 MG capsule TAKE 1 CAPSULE  BY MOUTH ONCE DAILY 90 capsule 0  . atorvastatin (LIPITOR) 40 MG tablet TAKE 1 TABLET (40 MG TOTAL) BY MOUTH DAILY. (Patient not taking: Reported on 09/01/2016) 90 tablet 1   No current facility-administered medications for this visit.     Allergies:   Amoxicillin; Bactrim [sulfamethoxazole-trimethoprim]; Cephalexin; and Sulfamethoxazole   Social History:  The patient  reports that she has never smoked. She has never used smokeless tobacco. She reports that she does not drink alcohol or use drugs.   Family History:  The patient's  family history includes Alcohol abuse in her brother and brother; Arthritis in her brother; Diabetes in her mother; Heart failure in her father; Hyperlipidemia in her father and mother; Lymphoma in her mother.   Sisters + palpitations   ROS:  Please see the history of present illness.   All other systems are reviewed and negative.    PHYSICAL EXAM: VS:  BP (!) 152/78   Pulse 82   Ht 5\' 9"  (1.753 m)   Wt 197 lb (89.4 kg)   BMI 29.09 kg/m  , BMI Body mass index is 29.09 kg/m. GEN: Well nourished, well developed, in no acute distress  HEENT: normal  Neck: no JVD, carotid bruits, or masses Cardiac: RRR; no murmurs, rubs, or gallops,no edema  Respiratory:  clear to auscultation bilaterally, normal work of breathing GI: soft, nontender, nondistended, + BS MS: no deformity or atrophy  Skin: warm and dry  Neuro:  Strength and sensation are intact Psych: euthymic mood, full affect  EKG:  EKG from 08/15/16 reveals short RP SVT at 161 bpm, ems strips show the same,  Sinus ekg also reviewed   Recent Labs: 10/02/2015: ALT 18; TSH 2.54 08/15/2016: BUN 12; Creatinine, Ser 0.87; Hemoglobin 12.3; Platelets 268; Potassium 4.3; Sodium 135    Lipid Panel     Component Value Date/Time   CHOL 143 10/02/2015 0821   TRIG 117.0 10/02/2015 0821   HDL 50.70 10/02/2015 0821   CHOLHDL 3 10/02/2015 0821   VLDL 23.4 10/02/2015 0821   LDLCALC 69 10/02/2015 0821    LDLDIRECT 95.1 08/23/2014 0846     Wt Readings from Last 3 Encounters:  09/01/16 197 lb (89.4 kg)  04/14/16 192 lb 11.2 oz (87.4 kg)  01/07/16 185 lb (83.9 kg)      Other studies Reviewed: Additional studies/ records that were reviewed today include: Dr Lanny Hurst prior note, Dr Velora Mediate notes  Review of the above records today demonstrates: as above   ASSESSMENT AND PLAN:  1.  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  She is currently taking coreg. Therapeutic strategies for supraventricular tachycardia including medicine and ablation were discussed in detail with the patient today. Risk, benefits,  and alternatives to EP study and radiofrequency ablation were also discussed in detail today. These risks include but are not limited to stroke, bleeding, vascular damage, tamponade, perforation, damage to the heart and other structures, AV block requiring pacemaker, worsening renal function, and death. The patient understands these risk and wishes to continue medical therapy. I will increase coreg to 18.75mg  BID today.  Reassess in 6 weeks  2. SOB/ diaphoresis Occurs during SVT primarily Concerning for ischemia Given HTN, overweight, and DM, will further risk stratify with exercise myoview. Echo to evaluate for structural changes  3. Hypertensive cardiovascular disease Elevated bp today Increase coreg  Follow-up: return to see me in 6 weeks  Current medicines are reviewed at length with the patient today.   The patient does not have concerns regarding her medicines.  The following changes were made today:  none   Signed, Holly Grayer, MD  09/01/2016 9:20 AM     [facility] [address]  [place] [address] [phone] (office) [phone] (fax)

## 2016-09-01 NOTE — Patient Instructions (Signed)
Medication Instructions:  Your physician has recommended you make the following change in your medication:  1) Increase Coreg to 18.75 mg twice daily   Labwork: None ordered   Testing/Procedures: Your physician has requested that you have an echocardiogram. Echocardiography is a painless test that uses sound waves to create images of your heart. It provides your doctor with information about the size and shape of your heart and how well your heart's chambers and valves are working. This procedure takes approximately one hour. There are no restrictions for this procedure.  Your physician has requested that you have en exercise stress myoview. For further information please visit [email or web address]. Please follow instruction sheet, as given.    Follow-Up: Your physician recommends that you schedule a follow-up appointment in: 6 weeks with Dr Rayann Heman   Any Other Special Instructions Will Be Listed Below (If Applicable).     If you need a refill on your cardiac medications before your next appointment, please call your pharmacy.

## 2016-09-22 ENCOUNTER — Telehealth (HOSPITAL_COMMUNITY): Payer: Self-pay | Admitting: *Deleted

## 2016-09-22 NOTE — Telephone Encounter (Signed)
Patient given detailed instructions per Myocardial Perfusion Study Information Sheet for the test on 09/24/16 at 0745. Patient notified to arrive 15 minutes early and that it is imperative to arrive on time for appointment to keep from having the test rescheduled.  If you need to cancel or reschedule your appointment, please call the office within 24 hours of your appointment. Failure to do so may result in a cancellation of your appointment, and a $50 no show fee. Patient verbalized understanding.Soni Kegel, Ranae Palms

## 2016-09-24 ENCOUNTER — Ambulatory Visit (HOSPITAL_COMMUNITY): Payer: 59 | Attending: Cardiology

## 2016-09-24 ENCOUNTER — Other Ambulatory Visit: Payer: Self-pay

## 2016-09-24 ENCOUNTER — Ambulatory Visit (HOSPITAL_BASED_OUTPATIENT_CLINIC_OR_DEPARTMENT_OTHER): Payer: 59

## 2016-09-24 DIAGNOSIS — R0602 Shortness of breath: Secondary | ICD-10-CM | POA: Diagnosis not present

## 2016-09-24 LAB — MYOCARDIAL PERFUSION IMAGING
CHL CUP MPHR: 155 {beats}/min
CHL CUP RESTING HR STRESS: 67 {beats}/min
CSEPEDS: 30 s
CSEPHR: 91 %
Estimated workload: 9.3 METS
Exercise duration (min): 7 min
LV sys vol: 16 mL
LVDIAVOL: 76 mL (ref 46–106)
Peak HR: 141 {beats}/min
RATE: 0.31
RPE: 19
SDS: 2
SRS: 2
SSS: 4
TID: 1.03

## 2016-09-24 MED ORDER — TECHNETIUM TC 99M TETROFOSMIN IV KIT
11.0000 | PACK | Freq: Once | INTRAVENOUS | Status: AC | PRN
  Administered 2016-09-24: 11 via INTRAVENOUS
  Filled 2016-09-24: qty 11

## 2016-09-24 MED ORDER — TECHNETIUM TC 99M TETROFOSMIN IV KIT
32.6000 | PACK | Freq: Once | INTRAVENOUS | Status: AC | PRN
  Administered 2016-09-24: 32.6 via INTRAVENOUS
  Filled 2016-09-24: qty 33

## 2016-10-02 ENCOUNTER — Telehealth: Payer: Self-pay | Admitting: *Deleted

## 2016-10-02 NOTE — Telephone Encounter (Signed)
-----   Message from Thompson Grayer, MD sent at 09/25/2016  7:44 AM EST ----- Results reviewed.  Claiborne Billings, please inform pt of result. I will route to primary care also.

## 2016-10-02 NOTE — Telephone Encounter (Signed)
Patient informed. 

## 2016-10-02 NOTE — Telephone Encounter (Signed)
-----   Message from Thompson Grayer, MD sent at 09/25/2016  7:42 AM EST ----- Results reviewed.  Claiborne Billings, please inform pt of result. I will route to primary care also.

## 2016-10-10 ENCOUNTER — Other Ambulatory Visit (INDEPENDENT_AMBULATORY_CARE_PROVIDER_SITE_OTHER): Payer: 59

## 2016-10-10 DIAGNOSIS — R7989 Other specified abnormal findings of blood chemistry: Secondary | ICD-10-CM

## 2016-10-10 DIAGNOSIS — Z0000 Encounter for general adult medical examination without abnormal findings: Secondary | ICD-10-CM

## 2016-10-10 LAB — BASIC METABOLIC PANEL
BUN: 11 mg/dL (ref 6–23)
CHLORIDE: 103 meq/L (ref 96–112)
CO2: 29 meq/L (ref 19–32)
Calcium: 9.3 mg/dL (ref 8.4–10.5)
Creatinine, Ser: 0.79 mg/dL (ref 0.40–1.20)
GFR: 77.49 mL/min (ref >60.00)
GLUCOSE: 138 mg/dL — AB (ref 70–99)
POTASSIUM: 3.9 meq/L (ref 3.5–5.1)
SODIUM: 140 meq/L (ref 135–145)

## 2016-10-10 LAB — LIPID PANEL
CHOLESTEROL: 255 mg/dL — AB (ref 0–200)
HDL: 49.8 mg/dL (ref >39.00)
NonHDL: 205.52
Total CHOL/HDL Ratio: 5
Triglycerides: 217 mg/dL — ABNORMAL HIGH (ref 0.0–149.0)
VLDL: 43.4 mg/dL — AB (ref 0.0–40.0)

## 2016-10-10 LAB — HEPATIC FUNCTION PANEL
ALBUMIN: 4.1 g/dL (ref 3.5–5.2)
ALT: 15 U/L (ref 0–35)
AST: 16 U/L (ref 0–37)
Alkaline Phosphatase: 61 U/L (ref 39–117)
Bilirubin, Direct: 0.1 mg/dL (ref 0.0–0.3)
Total Bilirubin: 0.5 mg/dL (ref 0.2–1.2)
Total Protein: 6.9 g/dL (ref 6.0–8.3)

## 2016-10-10 LAB — CBC WITH DIFFERENTIAL/PLATELET
BASOS PCT: 1.1 % (ref 0.0–3.0)
Basophils Absolute: 0.1 10*3/uL (ref 0.0–0.1)
EOS PCT: 6 % — AB (ref 0.0–5.0)
Eosinophils Absolute: 0.4 10*3/uL (ref 0.0–0.7)
HCT: 36.3 % (ref 36.0–46.0)
Hemoglobin: 12.5 g/dL (ref 12.0–15.0)
LYMPHS ABS: 3 10*3/uL (ref 0.7–4.0)
Lymphocytes Relative: 44.5 % (ref 12.0–46.0)
MCHC: 34.3 g/dL (ref 30.0–36.0)
MCV: 91.6 fl (ref 78.0–100.0)
MONOS PCT: 8.6 % (ref 3.0–12.0)
Monocytes Absolute: 0.6 10*3/uL (ref 0.1–1.0)
NEUTROS ABS: 2.7 10*3/uL (ref 1.4–7.7)
NEUTROS PCT: 39.8 % — AB (ref 43.0–77.0)
Platelets: 298 10*3/uL (ref 150.0–400.0)
RBC: 3.96 Mil/uL (ref 3.87–5.11)
RDW: 13 % (ref 11.5–15.5)
WBC: 6.7 10*3/uL (ref 4.0–10.5)

## 2016-10-10 LAB — MICROALBUMIN / CREATININE URINE RATIO
CREATININE, U: 76.9 mg/dL
Microalb Creat Ratio: 0.9 mg/g (ref 0.0–30.0)

## 2016-10-10 LAB — LDL CHOLESTEROL, DIRECT: LDL DIRECT: 171 mg/dL

## 2016-10-10 LAB — TSH: TSH: 2.17 u[IU]/mL (ref 0.35–4.50)

## 2016-10-10 LAB — HEMOGLOBIN A1C: HEMOGLOBIN A1C: 7.1 % — AB (ref 4.6–6.5)

## 2016-10-11 ENCOUNTER — Other Ambulatory Visit: Payer: Self-pay | Admitting: Internal Medicine

## 2016-10-12 ENCOUNTER — Other Ambulatory Visit: Payer: Self-pay | Admitting: Internal Medicine

## 2016-10-14 NOTE — Telephone Encounter (Signed)
Sent to the pharmacy by e-scribe for 90 days.  Pt has upcoming cpx on 10/17/16.

## 2016-10-14 NOTE — Telephone Encounter (Signed)
DENIED.  PER JIM AT PHARMACY.  PT HAS REFILLS ON FILE.  LAST FILLED ON 09/01/16.

## 2016-10-17 ENCOUNTER — Encounter: Payer: Self-pay | Admitting: Internal Medicine

## 2016-10-17 ENCOUNTER — Telehealth: Payer: Self-pay | Admitting: Internal Medicine

## 2016-10-17 ENCOUNTER — Ambulatory Visit (INDEPENDENT_AMBULATORY_CARE_PROVIDER_SITE_OTHER): Payer: 59 | Admitting: Internal Medicine

## 2016-10-17 VITALS — BP 136/72 | Temp 97.4°F | Ht 68.25 in | Wt 194.6 lb

## 2016-10-17 DIAGNOSIS — E785 Hyperlipidemia, unspecified: Secondary | ICD-10-CM | POA: Diagnosis not present

## 2016-10-17 DIAGNOSIS — Z79899 Other long term (current) drug therapy: Secondary | ICD-10-CM

## 2016-10-17 DIAGNOSIS — L309 Dermatitis, unspecified: Secondary | ICD-10-CM

## 2016-10-17 DIAGNOSIS — Z0000 Encounter for general adult medical examination without abnormal findings: Secondary | ICD-10-CM | POA: Diagnosis not present

## 2016-10-17 DIAGNOSIS — E119 Type 2 diabetes mellitus without complications: Secondary | ICD-10-CM | POA: Diagnosis not present

## 2016-10-17 DIAGNOSIS — I10 Essential (primary) hypertension: Secondary | ICD-10-CM | POA: Diagnosis not present

## 2016-10-17 DIAGNOSIS — E2839 Other primary ovarian failure: Secondary | ICD-10-CM

## 2016-10-17 NOTE — Progress Notes (Signed)
Pre visit review using our clinic review tool, if applicable. No additional management support is needed unless otherwise documented below in the visit note.  Chief Complaint  Patient presents with  . Preventive Visit    HPI: Holly Beasley 66 y.o. comes in today for ? Welcome to medicare?Preventive  wellness visit Uncertain if she has [employer] but here nevertheless for her meds exam and disease management  .Since last visit.  Doing well .   Had SVT  eval neg underlying  Problem .  On med  Sees dr Joylene Grapes had neg stress test echo etc.   [id number] sugar may be up some the ED as well over the holidays. Does do exercise  Lipids: Stop the Lipitor a while back because she was having some muscle aches when she stopped that there was really no difference as if she should go back on.  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 They are about the same no ulcers.  Last Pap smear was a number of years ago says she is going to get a GYN to do a Pap smear. No history of abnormals in the recent past area  No shortness of breath except when she is around the house but not when she goes to the gym no coughing has cats at home.  She no longer seeing her psychiatrist that she is doing so well on the Cymbalta and they felt that PCP could manage the medication.  Health Maintenance  Topic Date Due  . PAP SMEAR  11/13/2012  . DEXA SCAN  03/05/2016  . FOOT EXAM  10/15/2016  . HEMOGLOBIN A1C  04/10/2017  . OPHTHALMOLOGY EXAM  07/29/2017  . TETANUS/TDAP  04/03/2018  . MAMMOGRAM  07/30/2018  . COLONOSCOPY  02/08/2019  . INFLUENZA VACCINE  Completed  . ZOSTAVAX  Completed  . Hepatitis C Screening  Completed  . HIV Screening  Completed  . PNA vac Low Risk Adult  Completed   Health Maintenance Review LIFESTYLE:  TADn Sugar beverages: Sleep:  About 4- 10   anoight person and retired  hh of 2  3 cats     MEDICARE DOCUMENT QUESTIONS  TO  SCAN     Hearing: pass  Vision:  No limitations at present . Last eye check UTD  Safety:  Has smoke detector and wears seat belts.  No firearms. No excess sun exposure. Sees dentist regularly.  Falls: n  Advance directive :  Reviewed  Not yet  HA given   Memory: Felt to be good  , no concern from her or her family.  Depression: No anhedonia unusual crying or depressive symptoms  Nutrition: Eats well balanced diet; adequate calcium and vitamin D. No swallowing chewing problems.  Injury: no major injuries in the last six months.  Other healthcare providers:  Reviewed today .  Social:  Lives with spouse married. Cats   Preventive parameters: up-to-date  Reviewed   ADLS:   There are no problems or need for assistance  driving, feeding, obtaining food, dressing, toileting and bathing, managing money using phone. She is independent.    ROS:  See hpi  GEN/ HEENT: No fever, significant weight changes sweats headaches vision problems hearing changes, CV/ PULM; No chest pain shortness of breath cough, syncope,edema  change in exercise tolerance. GI /GU: No adominal pain, vomiting, change in bowel habits. No blood in the stool. No significant GU symptoms. SKIN/HEME: ,no acute skin rashes suspicious lesions or bleeding.  No lymphadenopathy, nodules, masses.  NEURO/ PSYCH:  No neurologic signs such as weakness numbness. No depression anxiety. IMM/ Allergy: No unusual infections.  Allergy .   REST of 12 system review negative except as per HPI   Past Medical History:  Diagnosis Date  . Allergy   . Depression   . Diabetes mellitus without complication ([place])   . Dry eyes   . GERD (gastroesophageal reflux disease)   . History of ETOH abuse    recovering  in remission  . Hyperlipidemia   . Hypertension   . Osteoarthritis   . Osteopenia    dexa -1.7 h -1.35   . Scoliosis    leg length discrepancy  . Skin cancer    basal cell nose  . Sleep apnea    did not meet criteria for  cpap-uses a mouthpiece  . SVT (supraventricular tachycardia) ([place]) 08/15/2016  . Varicose veins   . Wears contact lenses     Family History  Problem Relation Age of Onset  . Lymphoma Mother   . Diabetes Mother   . Hyperlipidemia Mother   . Heart failure Father   . Hyperlipidemia Father   . Arthritis Brother   . Alcohol abuse Brother   . Alcohol abuse Brother     Social History   Social History  . Marital status: Married    Spouse name: N/A  . Number of children: N/A  . Years of education: N/A   Social History Main Topics  . Smoking status: Never Smoker  . Smokeless tobacco: Never Used  . Alcohol use No     Comment: recovering-1983  . Drug use: No  . Sexual activity: Not Asked   Other Topics Concern  . 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[employer]   Married   ETOH recovering   [place] 2    Has family in [place]             Outpatient Encounter Prescriptions as of 10/17/2016  Medication Sig  . carvedilol (COREG) 12.5 MG tablet Take 1.5 tablets (18.75 mg total) by mouth 2 (two) times daily.  . ciclesonide (OMNARIS) 50 MCG/ACT nasal spray Place 2 sprays into both nostrils daily as needed for allergies.  . Cyanocobalamin (VITAMIN B-12 PO) Take 5,000 mcg by mouth daily.  . cycloSPORINE (RESTASIS) 0.05 % ophthalmic emulsion Place 1 drop into both eyes at bedtime.  . DULoxetine (CYMBALTA) 60 MG capsule TAKE 2 CAPSULES BY MOUTH AT BEDTIME  . losartan (COZAAR) 50 MG tablet Take 50 mg by mouth daily.  . metFORMIN (GLUCOPHAGE-XR) 500 MG 24 hr tablet Take 500 mg by mouth 3 (three) times daily.   . montelukast (SINGULAIR) 10 MG tablet TAKE 1 TABLET (10 MG TOTAL) BY MOUTH AT BEDTIME.  . MULTIPLE VITAMIN PO Take 1 tablet by mouth daily.  . Omega-3 Fatty Acids (FISH OIL) 1000 MG CAPS Take 1 capsule by mouth daily.  Marland Kitchen omeprazole (PRILOSEC) 40 MG capsule TAKE 1  CAPSULE BY MOUTH ONCE DAILY  . atorvastatin (LIPITOR) 40 MG tablet TAKE 1 TABLET (40 MG TOTAL) BY MOUTH DAILY. (Patient not taking: Reported on 10/17/2016)   No facility-administered encounter medications on file as of 10/17/2016.     EXAM:  BP 136/72 (BP Location: Right Arm, Patient Position: Sitting, Cuff Size: Large)   Temp 97.4 F (36.3 C) (Oral)  Ht 5' 8.25" (1.734 m)   Wt 194 lb 9.6 oz (88.3 kg)   BMI 29.37 kg/m   Body mass index is 29.37 kg/m.  Physical Exam: Vital signs reviewed [id number] is a well-developed well-nourished alert cooperative   who appears stated age in no acute distress.  HEENT: normocephalic atraumatic , Eyes: PERRL EOM's full, conjunctiva clear, Nares: paten,t no deformity discharge or tenderness., Ears: no deformity EAC's clear TMs with normal landmarks. Mouth: clear OP, no lesions, edema.  Moist mucous membranes. Dentition in adequate repair. NECK: supple without masses, thyromegaly or bruits. CHEST/PULM:  Clear to auscultation and percussion breath sounds equal no wheeze , rales or rhonchi. No chest wall deformities or tenderness.Breast: normal by inspection . No dimpling, discharge, masses, tenderness or discharge . CV: PMI is nondisplaced, S1 S2 no gallops, murmurs, rubs. Peripheral pulses are full without delay.No JVD .  ABDOMEN: Bowel sounds normal nontender  No guard or rebound, no hepato splenomegal no CVA tenderness.   Extremtities:  No clubbing cyanosis or edema, no acute joint swelling or redness no focal atrophy NEURO:  Oriented x3, cranial nerves 3-12 appear to be intact, no obvious focal weakness,gait within normal limits no abnormal reflexes or asymmetrical SKIN: No acute rashes normal turgor, color, no bruising or petechiae. PSYCH: Oriented, good eye contact, no obvious depression anxiety, cognition and judgment appear normal. LN: no cervical axillary inguinal adenopathy No noted deficits in memory, attention, and speech. Diabetic Foot Exam -  Simple   Simple Foot Form Diabetic Foot exam was performed with the following findings:  Yes 10/17/2016  8:50 AM  Visual Inspection See comments:  Yes Sensation Testing Intact to touch and monofilament testing bilaterally:  Yes Pulse Check Posterior Tibialis and Dorsalis pulse intact bilaterally:  Yes Comments Calluses scale on the bottom of the feet not a pressure phenomenon no ulcers but  scaling.      Lab Results  Component Value Date   WBC 6.7 10/10/2016   HGB 12.5 10/10/2016   HCT 36.3 10/10/2016   PLT 298.0 10/10/2016   GLUCOSE 138 (H) 10/10/2016   CHOL 255 (H) 10/10/2016   TRIG 217.0 (H) 10/10/2016   HDL 49.80 10/10/2016   LDLDIRECT 171.0 10/10/2016   LDLCALC 69 10/02/2015   ALT 15 10/10/2016   AST 16 10/10/2016   NA 140 10/10/2016   K 3.9 10/10/2016   CL 103 10/10/2016   CREATININE 0.79 10/10/2016   BUN 11 10/10/2016   CO2 29 10/10/2016   TSH 2.17 10/10/2016   HGBA1C 7.1 (H) 10/10/2016   MICROALBUR <0.7 10/10/2016    ASSESSMENT AND PLAN:  Discussed the following assessment and plan:  Welcome to Medicare preventive visit  Essential hypertension - controlled  Hyperlipidemia, unspecified hyperlipidemia type - Go back on statin medicine contact us at side effects plan follow-up lipids in 4-6 months  Controlled type 2 diabetes mellitus without complication, without long-term current use of insulin (HCC)  Medication management  Estrogen deficiency - Plan: DG Bone Density  Foot dermatitis Get dex a scan not high risk  Diabetes control could be better but seems to be stable. Will intensify lifestyle intervention and follow-up with her A1c and lipids in 4  months. Patient Care Team: Burnis Medin, MD as PCP - General Camillo Flaming, OD as Referring Physician (Optometry) Jiles Prows, MD as Attending Physician ([place]) Rolm Bookbinder, MD as Consulting Physician (Dermatology) Azucena Fallen, MD as Consulting Physician (Obstetrics and  Gynecology) Norma Fredrickson, MD as Consulting Physician (Psychiatry) Jeneen Rinks  Rockwell Alexandria, MD as Consulting Physician (Vascular Surgery) Thompson Grayer, MD as Consulting Physician (Cardiology)  Patient Instructions    Go back on the Lipitor because your cholesterol levels have gone back up and it doesn't seem like you had a side effect from it.  Attention to mindfulness about diet and exercise to get your sugars back down again.  Make an appointment for a bone density I will put order in the record for the [facility] office.  Follow-up visit in about 4 months with a cholesterol panel and hemoglobin A1c pre-visit.  Get your Pap smear as discussed and follow up with dermatology about your feet.   Standley Brooking. Panosh M.D.

## 2016-10-17 NOTE — Telephone Encounter (Signed)
Noted  

## 2016-10-17 NOTE — Patient Instructions (Addendum)
   Go back on the Lipitor because your cholesterol levels have gone back up and it doesn't seem like you had a side effect from it.  Attention to mindfulness about diet and exercise to get your sugars back down again.  Make an appointment for a bone density I will put order in the record for the [facility] office.  Follow-up visit in about 4 months with a cholesterol panel and hemoglobin A1c pre-visit.  Get your Pap smear as discussed and follow up with dermatology about your feet.

## 2016-10-17 NOTE — Telephone Encounter (Signed)
° ° ° ° °  Pt call back to say Holly Beasley is her primary insurance. She does have medicare part b but that is for hospital stay only.

## 2016-10-21 ENCOUNTER — Ambulatory Visit (INDEPENDENT_AMBULATORY_CARE_PROVIDER_SITE_OTHER)
Admission: RE | Admit: 2016-10-21 | Discharge: 2016-10-21 | Disposition: A | Discharge status: Home / Self Care | Payer: 59 | Source: Ambulatory Visit | Attending: Internal Medicine | Admitting: Internal Medicine

## 2016-10-21 DIAGNOSIS — E2839 Other primary ovarian failure: Secondary | ICD-10-CM | POA: Diagnosis not present

## 2016-10-26 ENCOUNTER — Other Ambulatory Visit: Payer: Self-pay | Admitting: Internal Medicine

## 2016-10-27 ENCOUNTER — Other Ambulatory Visit: Payer: Self-pay | Admitting: Internal Medicine

## 2016-10-28 NOTE — Telephone Encounter (Signed)
Sent to the pharmacy by e-scribe. 

## 2016-10-30 ENCOUNTER — Ambulatory Visit: Payer: 59 | Admitting: Internal Medicine

## 2016-11-05 ENCOUNTER — Encounter: Payer: Self-pay | Admitting: Internal Medicine

## 2016-11-05 ENCOUNTER — Ambulatory Visit (INDEPENDENT_AMBULATORY_CARE_PROVIDER_SITE_OTHER): Payer: 59 | Admitting: Internal Medicine

## 2016-11-05 VITALS — BP 140/80 | HR 69 | Ht 68.5 in | Wt 201.2 lb

## 2016-11-05 DIAGNOSIS — I471 Supraventricular tachycardia: Secondary | ICD-10-CM | POA: Diagnosis not present

## 2016-11-05 DIAGNOSIS — I119 Hypertensive heart disease without heart failure: Secondary | ICD-10-CM

## 2016-11-05 NOTE — Progress Notes (Signed)
Electrophysiology Office Note   Date:  11/05/2016   ID:  Holly Beasley, DOB 1951-03-05, MRN [id number]  PCP:  Lottie Dawson, MD  Cardiologist:  Nahser previously (2013) Primary Electrophysiologist: Thompson Grayer, MD    Chief Complaint  Patient presents with  . SVT (Super Ventricular Tachycardia)     History of Present Illness: Holly Beasley is a 66 y.o. female who presents today for electrophysiology evaluation.  Since her last visit, she has had no further sustained SVT.  Doing well with rare palpitations. She has chronic venous insufficiency.  Otherwise doing well.  Today, she denies symptoms of chest pain, sorthopnea, PND, claudication,  syncope, bleeding, or neurologic sequela. The patient is tolerating medications without difficulties and is otherwise without complaint today.    Past Medical History:  Diagnosis Date  . Allergy   . Depression   . Diabetes mellitus without complication ([place])   . Dry eyes   . GERD (gastroesophageal reflux disease)   . History of ETOH abuse    recovering  in remission  . Hyperlipidemia   . Hypertension   . Osteoarthritis   . Osteopenia    dexa -1.7 h -1.35   . Scoliosis    leg length discrepancy  . Skin cancer    basal cell nose  . Sleep apnea    did not meet criteria for cpap-uses a mouthpiece  . SVT (supraventricular tachycardia) ([place]) 08/15/2016  . Varicose veins   . Wears contact lenses    Past Surgical History:  Procedure Laterality Date  . BREAST BIOPSY     2003-rt  . COLONOSCOPY W/ POLYPECTOMY     precancerous   2004  . KNEE ARTHROSCOPY WITH LATERAL MENISECTOMY Left 11/10/2014   Procedure: LEFT KNEE ARTHROSCOPY PARTIAL LATERAL MENISECTOMY CHONDROPLASTY;  Surgeon: Hessie Dibble, MD;  Location: [facility];  Service: Orthopedics;  Laterality: Left;  . NASAL SINUS SURGERY  11-13-2008   polyps removed  . skin cancer removal     basal cell face  . TONSILLECTOMY AND ADENOIDECTOMY  1967  . VENOUS  ABLATION  2015   Dr Kellie Simmering     Current Outpatient Prescriptions  Medication Sig Dispense Refill  . atorvastatin (LIPITOR) 40 MG tablet TAKE 1 TABLET (40 MG TOTAL) BY MOUTH DAILY. 90 tablet 1  . carvedilol (COREG) 12.5 MG tablet Take 1.5 tablets (18.75 mg total) by mouth 2 (two) times daily. 270 tablet 3  . ciclesonide (OMNARIS) 50 MCG/ACT nasal spray Place 2 sprays into both nostrils daily as needed for allergies.    . Cyanocobalamin (VITAMIN B-12 PO) Take 5,000 mcg by mouth daily.    . cycloSPORINE (RESTASIS) 0.05 % ophthalmic emulsion Place 1 drop into both eyes at bedtime.    . DULoxetine (CYMBALTA) 60 MG capsule TAKE 2 CAPSULES BY MOUTH AT BEDTIME 180 capsule 0  . losartan (COZAAR) 100 MG tablet TAKE 1 TABLET (100 MG TOTAL) BY MOUTH DAILY. 90 tablet 1  . metFORMIN (GLUCOPHAGE-XR) 500 MG 24 hr tablet Take 500 mg by mouth 3 (three) times daily.     . montelukast (SINGULAIR) 10 MG tablet TAKE 1 TABLET (10 MG TOTAL) BY MOUTH AT BEDTIME. 90 tablet 3  . MULTIPLE VITAMIN PO Take 1 tablet by mouth daily.    . Omega-3 Fatty Acids (FISH OIL) 1000 MG CAPS Take 1 capsule by mouth daily.    Marland Kitchen omeprazole (PRILOSEC) 40 MG capsule TAKE 1 CAPSULE BY MOUTH ONCE DAILY 90 capsule 1  . OMNARIS 50  MCG/ACT nasal spray PLACE 2 SPRAYS INTO BOTH NOSTRILS DAILY. 12.5 g 5   No current facility-administered medications for this visit.     Allergies:   Amoxicillin; Bactrim [sulfamethoxazole-trimethoprim]; Cephalexin; and Sulfamethoxazole   Social History:  The patient  reports that she has never smoked. She has never used smokeless tobacco. She reports that she does not drink alcohol or use drugs.   Family History:  The patient's  family history includes Alcohol abuse in her brother and brother; Arthritis in her brother; Diabetes in her mother; Heart failure in her father; Hyperlipidemia in her father and mother; Lymphoma in her mother.   Sisters + palpitations   ROS:  Please see the history of present illness.    All other systems are reviewed and negative.    PHYSICAL EXAM: VS:  BP 140/80   Pulse 69   Ht 5' 8.5" (1.74 m)   Wt 201 lb 3.2 oz (91.3 kg)   SpO2 98%   BMI 30.15 kg/m  , BMI Body mass index is 30.15 kg/m. GEN: Well nourished, well developed, in no acute distress  HEENT: normal  Neck: no JVD, carotid bruits, or masses Cardiac: RRR; no murmurs, rubs, or gallops 1+ RL edema  Respiratory:  clear to auscultation bilaterally, normal work of breathing GI: soft, nontender, nondistended, + BS MS: no deformity or atrophy  Skin: warm and dry  Neuro:  Strength and sensation are intact Psych: euthymic mood, full affect   Recent Labs: 10/10/2016: ALT 15; BUN 11; Creatinine, Ser 0.79; Hemoglobin 12.5; Platelets 298.0; Potassium 3.9; Sodium 140; TSH 2.17    Lipid Panel     Component Value Date/Time   CHOL 255 (H) 10/10/2016 0802   TRIG 217.0 (H) 10/10/2016 0802   HDL 49.80 10/10/2016 0802   CHOLHDL 5 10/10/2016 0802   VLDL 43.4 (H) 10/10/2016 0802   LDLCALC 69 10/02/2015 0821   LDLDIRECT 171.0 10/10/2016 0802     Wt Readings from Last 3 Encounters:  11/05/16 201 lb 3.2 oz (91.3 kg)  10/17/16 194 lb 9.6 oz (88.3 kg)  09/24/16 197 lb (89.4 kg)      ASSESSMENT AND PLAN:  1.  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  She is currently taking coreg. We again discussed ablation today.  She is not ready to proceed.  She would prefer to continue her current medicines Echo reviewed with her today  2.  Hypertensive cardiovascular disease Stable No change required today  3. Venous insufficiency Support hose, elevated feet at night, supportive care, salt restriction  Follow-up: return to see EP NP in 1 year unless problems arise in the interim.  Current medicines are reviewed at length with the patient today.   The patient does not have concerns regarding her medicines.  The following changes were made today:  none   Signed, Thompson Grayer, MD   11/05/2016 11:41 AM     [facility] [address] [place] [address] [phone] (office) [phone] (fax)

## 2016-11-05 NOTE — Patient Instructions (Signed)
Medication Instructions:  Your physician recommends that you continue on your current medications as directed. Please refer to the Current Medication list given to you today.    Labwork: None ordered   Testing/Procedures: None ordered'  Follow-Up: Your physician wants you to follow-up in: 12 months with Amber Seiler, NP. You will receive a reminder letter in the mail two months in advance. If you don't receive a letter, please call our office to schedule the follow-up appointment.   Any Other Special Instructions Will Be Listed Below (If Applicable).     If you need a refill on your cardiac medications before your next appointment, please call your pharmacy.   

## 2016-11-14 ENCOUNTER — Other Ambulatory Visit: Payer: Self-pay | Admitting: Internal Medicine

## 2016-11-17 NOTE — Telephone Encounter (Signed)
Sent to the pharmacy by e-scribe for 6 months.  Pt is scheduled for follow up on 02/18/17.

## 2016-12-10 ENCOUNTER — Other Ambulatory Visit: Payer: Self-pay | Admitting: Internal Medicine

## 2016-12-10 NOTE — Telephone Encounter (Signed)
Sent in 3 month supply into pharmacy until pt next visit

## 2017-01-12 ENCOUNTER — Other Ambulatory Visit: Payer: Self-pay | Admitting: Internal Medicine

## 2017-02-10 ENCOUNTER — Other Ambulatory Visit: Payer: Self-pay | Admitting: Family Medicine

## 2017-02-10 DIAGNOSIS — E785 Hyperlipidemia, unspecified: Secondary | ICD-10-CM

## 2017-02-10 DIAGNOSIS — E119 Type 2 diabetes mellitus without complications: Secondary | ICD-10-CM

## 2017-02-11 ENCOUNTER — Other Ambulatory Visit (INDEPENDENT_AMBULATORY_CARE_PROVIDER_SITE_OTHER): Payer: 59

## 2017-02-11 DIAGNOSIS — E119 Type 2 diabetes mellitus without complications: Secondary | ICD-10-CM

## 2017-02-11 DIAGNOSIS — E785 Hyperlipidemia, unspecified: Secondary | ICD-10-CM

## 2017-02-11 LAB — LIPID PANEL
CHOL/HDL RATIO: 4
CHOLESTEROL: 187 mg/dL (ref 0–200)
HDL: 45 mg/dL (ref >39.00)
LDL Cholesterol: 111 mg/dL — ABNORMAL HIGH (ref 0–99)
NonHDL: 141.56
TRIGLYCERIDES: 152 mg/dL — AB (ref 0.0–149.0)
VLDL: 30.4 mg/dL (ref 0.0–40.0)

## 2017-02-11 LAB — HEMOGLOBIN A1C: Hgb A1c MFr Bld: 8 % — ABNORMAL HIGH (ref 4.6–6.5)

## 2017-02-16 NOTE — Progress Notes (Signed)
Pre visit review using our clinic review tool, if applicable. No additional management support is needed unless otherwise documented below in the visit note.  Chief Complaint  Patient presents with  . Follow-up    HPI: Holly Beasley 66 y.o. come in for Chronic disease management   DM not paying attention to eating very much recently   Likes sweets  No vision change or neuro sx   No se of meds  Lipids back on statin  bp  No change med  No se of med noted ROS: See pertinent positives and negatives per HPI. No cv pulm sx at this time  Past Medical History:  Diagnosis Date  . Allergy   . Depression   . Diabetes mellitus without complication ([place])   . Dry eyes   . GERD (gastroesophageal reflux disease)   . History of ETOH abuse    recovering  in remission  . Hyperlipidemia   . Hypertension   . Osteoarthritis   . Osteopenia    dexa -1.7 h -1.35   . Scoliosis    leg length discrepancy  . Skin cancer    basal cell nose  . Sleep apnea    did not meet criteria for cpap-uses a mouthpiece  . SVT (supraventricular tachycardia) ([place]) 08/15/2016  . Varicose veins   . Wears contact lenses     Family History  Problem Relation Age of Onset  . Lymphoma Mother   . Diabetes Mother   . Hyperlipidemia Mother   . Heart failure Father   . Hyperlipidemia Father   . Arthritis Brother   . Alcohol abuse Brother   . Alcohol abuse Brother     Social History   Social History  . Marital status: Married    Spouse name: N/A  . Number of children: N/A  . Years of education: N/A   Social History Main Topics  . Smoking status: Never Smoker  . Smokeless tobacco: Never Used  . Alcohol use No     Comment: recovering-1983  . Drug use: No  . Sexual activity: Not Asked   Other Topics Concern  . 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 atorvastatin (LIPITOR) 40 MG tablet TAKE 1 TABLET (40 MG TOTAL) BY MOUTH DAILY. 90 tablet 0  . Cyanocobalamin (VITAMIN B-12 PO) Take 5,000 mcg by mouth daily.    . cycloSPORINE (RESTASIS) 0.05 % ophthalmic emulsion Place 1 drop into both eyes at bedtime.    . DULoxetine (CYMBALTA) 60 MG capsule TAKE 2 CAPSULES BY MOUTH AT BEDTIME 180 capsule 1  . losartan (COZAAR) 100 MG tablet TAKE 1 TABLET (100 MG TOTAL) BY MOUTH DAILY. 90 tablet 1  . metFORMIN (GLUCOPHAGE-XR) 500 MG 24 hr tablet TAKE 3 TABLETS EVERY DAY 270 tablet 1  . montelukast (SINGULAIR) 10 MG tablet TAKE 1 TABLET (10 MG TOTAL) BY MOUTH AT BEDTIME. 90 tablet 3  . MULTIPLE VITAMIN PO Take 1 tablet by mouth daily.    . Omega-3 Fatty Acids (FISH OIL) 1000 MG CAPS Take 1 capsule by mouth daily.    Marland Kitchen omeprazole (PRILOSEC)  40 MG capsule TAKE 1 CAPSULE BY MOUTH ONCE DAILY 90 capsule 1  . OMNARIS 50 MCG/ACT nasal spray PLACE 2 SPRAYS INTO BOTH NOSTRILS DAILY. 12.5 g 5  . carvedilol (COREG) 12.5 MG tablet Take 1.5 tablets (18.75 mg total) by mouth 2 (two) times daily. 270 tablet 3  . ciclesonide (OMNARIS) 50 MCG/ACT nasal spray Place 2 sprays into both nostrils daily as needed for allergies.    . metFORMIN (GLUCOPHAGE-XR) 500 MG 24 hr tablet Take 500 mg by mouth 3 (three) times daily.      No facility-administered medications prior to visit.      EXAM:  BP 122/72 (BP Location: Right Arm)   Temp 98.1 F (36.7 C) (Oral)   Ht 5' 8.5" (1.74 m)   Wt 196 lb (88.9 kg)   BMI 29.37 kg/m   Body mass index is 29.37 kg/m.  GENERAL: vitals reviewed and listed above, alert, oriented, appears well hydrated and in no acute distress HEENT: atraumatic, conjunctiva  clear, no obvious abnormalities on inspection of external nose and earsMS: moves all extremities without noticeable focal  abnormality PSYCH: pleasant and  cooperative, no obvious depression or anxiety Lab Results  Component Value Date   WBC 6.7 10/10/2016   HGB 12.5 10/10/2016   HCT 36.3 10/10/2016   PLT 298.0 10/10/2016   GLUCOSE 138 (H) 10/10/2016   CHOL 187 02/11/2017   TRIG 152.0 (H) 02/11/2017   HDL 45.00 02/11/2017   LDLDIRECT 171.0 10/10/2016   LDLCALC 111 (H) 02/11/2017   ALT 15 10/10/2016   AST 16 10/10/2016   NA 140 10/10/2016   K 3.9 10/10/2016   CL 103 10/10/2016   CREATININE 0.79 10/10/2016   BUN 11 10/10/2016   CO2 29 10/10/2016   TSH 2.17 10/10/2016   HGBA1C 8.0 (H) 02/11/2017   MICROALBUR <0.7 10/10/2016   BP Readings from Last 3 Encounters:  02/18/17 122/72  11/05/16 140/80  10/17/16 136/72   Wt Readings from Last 3 Encounters:  02/18/17 196 lb (88.9 kg)  11/05/16 201 lb 3.2 oz (91.3 kg)  10/17/16 194 lb 9.6 oz (88.3 kg)     ASSESSMENT AND PLAN:  Discussed the following assessment and plan:  Controlled type 2 diabetes mellitus without complication, without long-term current use of insulin (HCC) - deterioration of control went off eating healthy will readdress and inc met to 2000 mg per day  Medication management  Essential hypertension - improved today   Hyperlipidemia, unspecified hyperlipidemia type - improved on meds   Weight loss advised  Going to [place] and plans on attention when comes back  Plan fu in 3-4 mos  a1c fu  -Patient advised to return or notify health care team  if  new concerns arise. revied above and plan and strategies  And fu  Patient Instructions  Your a1c is up some  Goal below 8  We can add  Med but increase  To metformin 500 mg 4 x per day .   ROV in 3-4 months  Will do a1c at the visits.      Standley Brooking. Aritzel Krusemark M.D.

## 2017-02-18 ENCOUNTER — Ambulatory Visit (INDEPENDENT_AMBULATORY_CARE_PROVIDER_SITE_OTHER): Payer: 59 | Admitting: Internal Medicine

## 2017-02-18 ENCOUNTER — Encounter: Payer: Self-pay | Admitting: Internal Medicine

## 2017-02-18 VITALS — BP 122/72 | Temp 98.1°F | Ht 68.5 in | Wt 196.0 lb

## 2017-02-18 DIAGNOSIS — E119 Type 2 diabetes mellitus without complications: Secondary | ICD-10-CM

## 2017-02-18 DIAGNOSIS — Z79899 Other long term (current) drug therapy: Secondary | ICD-10-CM | POA: Diagnosis not present

## 2017-02-18 DIAGNOSIS — E785 Hyperlipidemia, unspecified: Secondary | ICD-10-CM

## 2017-02-18 DIAGNOSIS — I10 Essential (primary) hypertension: Secondary | ICD-10-CM | POA: Diagnosis not present

## 2017-02-18 NOTE — Patient Instructions (Addendum)
Your a1c is up some  Goal below 8  We can add  Med but increase  To metformin 500 mg 4 x per day .   ROV in 3-4 months  Will do a1c at the visits.

## 2017-03-17 ENCOUNTER — Other Ambulatory Visit: Payer: Self-pay | Admitting: *Deleted

## 2017-03-17 DIAGNOSIS — I83892 Varicose veins of left lower extremities with other complications: Secondary | ICD-10-CM

## 2017-03-25 ENCOUNTER — Other Ambulatory Visit: Payer: Self-pay | Admitting: Internal Medicine

## 2017-04-02 ENCOUNTER — Encounter: Payer: Self-pay | Admitting: Vascular Surgery

## 2017-04-14 ENCOUNTER — Ambulatory Visit (INDEPENDENT_AMBULATORY_CARE_PROVIDER_SITE_OTHER): Payer: 59 | Admitting: Vascular Surgery

## 2017-04-14 ENCOUNTER — Encounter: Payer: Self-pay | Admitting: Vascular Surgery

## 2017-04-14 ENCOUNTER — Ambulatory Visit (HOSPITAL_COMMUNITY)
Admission: RE | Admit: 2017-04-14 | Discharge: 2017-04-14 | Disposition: A | Discharge status: Home / Self Care | Payer: 59 | Source: Ambulatory Visit | Attending: Vascular Surgery | Admitting: Vascular Surgery

## 2017-04-14 VITALS — BP 148/83 | HR 71 | Temp 97.9°F | Resp 18 | Ht 68.5 in | Wt 196.7 lb

## 2017-04-14 DIAGNOSIS — I872 Venous insufficiency (chronic) (peripheral): Secondary | ICD-10-CM | POA: Insufficient documentation

## 2017-04-14 DIAGNOSIS — R600 Localized edema: Secondary | ICD-10-CM | POA: Diagnosis not present

## 2017-04-14 DIAGNOSIS — I83892 Varicose veins of left lower extremities with other complications: Secondary | ICD-10-CM

## 2017-04-14 NOTE — Progress Notes (Signed)
Subjective:     Patient ID: Holly Beasley, female   DOB: [birth year], 66 y.o.   MRN: [id number]  HPI This 66 year old female was referred by Dr. Ubaldo Glassing for evaluation of left lower extremity edema. Patient has a history of chronic edema and I evaluated her in 2015 and found superficial reflux in the left great saphenous vein. This was treated with laser ablation successfully. Her edema has been intermittent since that time. At times she has significant edema in the left lower third of her lower leg with some darkening of the skin. Other times she has no edema she states. Chest no history of stasis ulcers DVT thrombophlebitis or bleeding. She does not elevate her legs at night and occasionally will wear short leg elastic stockings when on flights but not on regular basis.  Past Medical History:  Diagnosis Date  . Allergy   . Depression   . Diabetes mellitus without complication ([place])   . Dry eyes   . GERD (gastroesophageal reflux disease)   . History of ETOH abuse    recovering  in remission  . Hyperlipidemia   . Hypertension   . Osteoarthritis   . Osteopenia    dexa -1.7 h -1.35   . Scoliosis    leg length discrepancy  . Skin cancer    basal cell nose  . Sleep apnea    did not meet criteria for cpap-uses a mouthpiece  . SVT (supraventricular tachycardia) ([place]) 08/15/2016  . Varicose veins   . Wears contact lenses     Social History  Substance Use Topics  . Smoking status: Never Smoker  . Smokeless tobacco: Never Used  . Alcohol use No     Comment: recovering-1983    Family History  Problem Relation Age of Onset  . Lymphoma Mother   . Diabetes Mother   . Hyperlipidemia Mother   . Heart failure Father   . Hyperlipidemia Father   . Arthritis Brother   . Alcohol abuse Brother   . Alcohol abuse Brother     Allergies  Allergen Reactions  . Amoxicillin     REACTION: unknown  . Bactrim [Sulfamethoxazole-Trimethoprim]     Rash and stomach pain  . Cephalexin    REACTION: unknown  . Sulfamethoxazole     Rash and stomach pain     Current Outpatient Prescriptions:  .  atorvastatin (LIPITOR) 40 MG tablet, TAKE 1 TABLET (40 MG TOTAL) BY MOUTH DAILY., Disp: 90 tablet, Rfl: 0 .  Cyanocobalamin (VITAMIN B-12 PO), Take 5,000 mcg by mouth daily., Disp: , Rfl:  .  cycloSPORINE (RESTASIS) 0.05 % ophthalmic emulsion, Place 1 drop into both eyes at bedtime., Disp: , Rfl:  .  DULoxetine (CYMBALTA) 60 MG capsule, TAKE 2 CAPSULES BY MOUTH AT BEDTIME, Disp: 180 capsule, Rfl: 1 .  losartan (COZAAR) 100 MG tablet, TAKE 1 TABLET (100 MG TOTAL) BY MOUTH DAILY., Disp: 90 tablet, Rfl: 1 .  metFORMIN (GLUCOPHAGE-XR) 500 MG 24 hr tablet, TAKE 3 TABLETS EVERY DAY (Patient taking differently: TAKE 4 TABLETS EVERY DAY), Disp: 270 tablet, Rfl: 1 .  montelukast (SINGULAIR) 10 MG tablet, TAKE 1 TABLET (10 MG TOTAL) BY MOUTH AT BEDTIME., Disp: 90 tablet, Rfl: 0 .  MULTIPLE VITAMIN PO, Take 1 tablet by mouth daily., Disp: , Rfl:  .  Omega-3 Fatty Acids (FISH OIL) 1000 MG CAPS, Take 1 capsule by mouth daily., Disp: , Rfl:  .  omeprazole (PRILOSEC) 40 MG capsule, TAKE 1 CAPSULE BY MOUTH ONCE DAILY, Disp: 90  capsule, Rfl: 1 .  OMNARIS 50 MCG/ACT nasal spray, PLACE 2 SPRAYS INTO BOTH NOSTRILS DAILY., Disp: 12.5 g, Rfl: 5 .  carvedilol (COREG) 12.5 MG tablet, Take 1.5 tablets (18.75 mg total) by mouth 2 (two) times daily., Disp: 270 tablet, Rfl: 3  Vitals:   04/14/17 1026  BP: (!) 148/83  Pulse: 71  Resp: 18  Temp: 97.9 F (36.6 C)  TempSrc: Oral  SpO2: 96%  Weight: 196 lb 11.2 oz (89.2 kg)  Height: 5' 8.5" (1.74 m)    Body mass index is 29.47 kg/m.         Review of Systems She has had episodes of cellulitis in the left leg. She also has had arthroscopy of the left knee. She had 2 ultrasound procedures performed at different dates which confirmed no DVT in the left leg following her initial laser procedure and her arthroscopy.    Objective:   Physical Exam BP (!)  148/83 (BP Location: Left Arm, Patient Position: Sitting, Cuff Size: Normal)   Pulse 71   Temp 97.9 F (36.6 C) (Oral)   Resp 18   Ht 5' 8.5" (1.74 m)   Wt 196 lb 11.2 oz (89.2 kg)   SpO2 96%   BMI 29.47 kg/m   Gen. alert and oriented 3 in no apparent distress Lungs no rhonchi or wheezing Left leg with 1+ to 2+ edema lower third with some early hyperpigmentation but no active ulcer. Puffiness on dorsum left foot. 2+ dorsalis pedis pulse palpable. No bulging varicosities noted.  Today I ordered a venous duplex exam the left leg which I reviewed and interpreted. There is no DVT. There is no significant deep vein reflux. The great saphenous vein is patent in a few areas but there is no reflux in the left great or small saphenous veins and they're small in caliber.     Assessment:     Edema left lower leg-etiology unknown No evidence of significant superficial or deep venous reflux on the left No DVT    Plan:     Had long discussion with patient explaining optimal medical management for this edema Elevate foot of bed 3 inches with something under bed or mattress Apply left short leg elastic compression stockings 20-30 millimeter gradient upon arising in the morning for maximum benefit No intervention from a vascular standpoint will improve this situation

## 2017-04-24 ENCOUNTER — Other Ambulatory Visit: Payer: Self-pay | Admitting: Internal Medicine

## 2017-05-14 ENCOUNTER — Other Ambulatory Visit: Payer: Self-pay | Admitting: Internal Medicine

## 2017-06-22 ENCOUNTER — Other Ambulatory Visit: Payer: Self-pay | Admitting: Internal Medicine

## 2017-06-24 ENCOUNTER — Ambulatory Visit: Payer: 59 | Admitting: Internal Medicine

## 2017-07-03 ENCOUNTER — Encounter: Payer: Self-pay | Admitting: Internal Medicine

## 2017-07-04 ENCOUNTER — Other Ambulatory Visit: Payer: Self-pay | Admitting: Internal Medicine

## 2017-07-09 ENCOUNTER — Other Ambulatory Visit: Payer: Self-pay | Admitting: Emergency Medicine

## 2017-07-09 MED ORDER — DULOXETINE HCL 60 MG PO CPEP: 120.0000 mg | ORAL_CAPSULE | Freq: Every day | ORAL | 1 refills | Status: DC

## 2017-07-20 NOTE — Progress Notes (Signed)
Chief Complaint  Patient presents with  . Follow-up    A1c check today. Pt c/o right hip pain and pain/heaviness in legs. Some swelling in left foot.    HPI: Holly Beasley 66 y.o. come in for Chronic disease management   Dm ? Inc metformin  Do a1c Some desserts .  recently  Tolerating  4 metformin per day  500  No se .    Right hip gets"caught" and out of place and has to put back in and  Problematic at times   also le leg feel heavy   VV rx left ok and ocass puffiness    Has had TKR  [place].  Who should she see  For more evaluation.   Bp seems ok  On meds for svt no recurrence   ? Shingles vaccine?  ROS: See pertinent positives and negatives per HPI.  Past Medical History:  Diagnosis Date  . Allergy   . Depression   . Diabetes mellitus without complication ([place])   . Dry eyes   . GERD (gastroesophageal reflux disease)   . History of ETOH abuse    recovering  in remission  . Hyperlipidemia   . Hypertension   . Osteoarthritis   . Osteopenia    dexa -1.7 h -1.35   . Scoliosis    leg length discrepancy  . Skin cancer    basal cell nose  . Sleep apnea    did not meet criteria for cpap-uses a mouthpiece  . SVT (supraventricular tachycardia) ([place]) 08/15/2016  . Varicose veins   . Wears contact lenses     Family History  Problem Relation Age of Onset  . Lymphoma Mother   . Diabetes Mother   . Hyperlipidemia Mother   . Heart failure Father   . Hyperlipidemia Father   . Arthritis Brother   . Alcohol abuse Brother   . Alcohol abuse Brother     Social History   Social History  . Marital status: Married    Spouse name: N/A  . Number of children: N/A  . Years of education: N/A   Social History Main Topics  . Smoking status: Never Smoker  . Smokeless tobacco: Never Used  . Alcohol use No     Comment: recovering-1983  . Drug use: No  . Sexual activity: Not Asked   Other Topics Concern  . 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[employer]   Married   ETOH recovering   [place] [address]    Has family in [place]             Outpatient Medications Prior to Visit  Medication Sig Dispense Refill  . atorvastatin (LIPITOR) 40 MG tablet TAKE 1 TABLET (40 MG TOTAL) BY MOUTH DAILY. 90 tablet 0  . Cyanocobalamin (VITAMIN B-12 PO) Take 5,000 mcg by mouth daily.    . cycloSPORINE (RESTASIS) 0.05 % ophthalmic emulsion Place 1 drop into both eyes at bedtime.    . DULoxetine (CYMBALTA) 60 MG capsule Take 2 capsules (120 mg total) by mouth at bedtime. 180 capsule 1  . losartan (COZAAR) 100 MG tablet TAKE 1 TABLET (100 MG TOTAL) BY MOUTH DAILY. 90 tablet 0  . metFORMIN (GLUCOPHAGE-XR) 500 MG 24 hr tablet TAKE 3 TABLETS EVERY DAY (Patient taking differently: TAKE 4 TABLETS EVERY DAY) 270 tablet 0  .  montelukast (SINGULAIR) 10 MG tablet TAKE 1 TABLET (10 MG TOTAL) BY MOUTH AT BEDTIME. 90 tablet 1  . MULTIPLE VITAMIN PO Take 1 tablet by mouth daily.    . Omega-3 Fatty Acids (FISH OIL) 1000 MG CAPS Take 1 capsule by mouth daily.    Marland Kitchen omeprazole (PRILOSEC) 40 MG capsule TAKE 1 CAPSULE BY MOUTH ONCE DAILY 90 capsule 0  . OMNARIS 50 MCG/ACT nasal spray PLACE 2 SPRAYS INTO BOTH NOSTRILS DAILY. 12.5 g 5  . carvedilol (COREG) 12.5 MG tablet Take 1.5 tablets (18.75 mg total) by mouth 2 (two) times daily. 270 tablet 3   No facility-administered medications prior to visit.      EXAM:  BP 132/72 (BP Location: Right Arm, Patient Position: Sitting, Cuff Size: Normal)   Pulse 69   Temp 97.9 F (36.6 C) (Oral)   Wt 192 lb 6.4 oz (87.3 kg)   BMI 28.83 kg/m   Body mass index is 28.83 kg/m.  GENERAL: vitals reviewed and listed above, alert, oriented, appears well hydrated and in no acute distress NECK: no obvious masses on inspection palpation  LUNGS: clear to auscultation bilaterally, no wheezes, rales or rhonchi, good air movement CV: HRRR, no  clubbing cyanosis or  peripheral edema nl cap refill  MS: moves all extremities without noticeable focal  Abnormality  Points to right groin inguinal area   ( ghas scoliosis)   PSYCH: pleasant and cooperative, no obvious depression or anxiety  BP Readings from Last 3 Encounters:  07/21/17 132/72  04/14/17 (!) 148/83  02/18/17 122/72   Wt Readings from Last 3 Encounters:  07/21/17 192 lb 6.4 oz (87.3 kg)  04/14/17 196 lb 11.2 oz (89.2 kg)  02/18/17 196 lb (88.9 kg)   Lab Results  Component Value Date   HGBA1C 6.6 07/21/2017   HGBA1C 8.0 (H) 02/11/2017   HGBA1C 7.1 (H) 10/10/2016   Lab Results  Component Value Date   MICROALBUR <0.7 10/10/2016   LDLCALC 111 (H) 02/11/2017   CREATININE 0.79 10/10/2016     ASSESSMENT AND PLAN:  Discussed the following assessment and plan:  Controlled type 2 diabetes mellitus without complication, without long-term current use of insulin ([place]) - improved   stay on same meds fu at [facility] - Plan: POC HgB A1c  Need for influenza vaccination - Plan: Flu vaccine HIGH DOSE PF (Fluzone High dose)  Medication management  Essential hypertension - controlled  Right hip pain ? -  see ortho as  group and discussed   Jan  Feb 9 yearly  -Patient advised to return or notify health care team  if  new concerns arise.  Patient Instructions  See ortho about the hips .  Pain on right  Intensify  attention to diet and   Blood sugar  Control.   Get back   To dietary .   Check into shingles vaccine. Shingrix  Can receive either in the office or pharmacy depending on insurance rules.  Standley Brooking. Trueman Worlds M.D.

## 2017-07-21 ENCOUNTER — Encounter: Payer: Self-pay | Admitting: Internal Medicine

## 2017-07-21 ENCOUNTER — Ambulatory Visit (INDEPENDENT_AMBULATORY_CARE_PROVIDER_SITE_OTHER): Payer: 59 | Admitting: Internal Medicine

## 2017-07-21 VITALS — BP 132/72 | HR 69 | Temp 97.9°F | Wt 192.4 lb

## 2017-07-21 DIAGNOSIS — Z23 Encounter for immunization: Secondary | ICD-10-CM

## 2017-07-21 DIAGNOSIS — M25551 Pain in right hip: Secondary | ICD-10-CM | POA: Diagnosis not present

## 2017-07-21 DIAGNOSIS — Z79899 Other long term (current) drug therapy: Secondary | ICD-10-CM

## 2017-07-21 DIAGNOSIS — E119 Type 2 diabetes mellitus without complications: Secondary | ICD-10-CM

## 2017-07-21 DIAGNOSIS — I10 Essential (primary) hypertension: Secondary | ICD-10-CM | POA: Diagnosis not present

## 2017-07-21 LAB — POCT GLYCOSYLATED HEMOGLOBIN (HGB A1C): HEMOGLOBIN A1C: 6.6

## 2017-07-21 NOTE — Patient Instructions (Addendum)
See ortho about the hips .  Pain on right  Intensify  attention to diet and   Blood sugar  Control.   Get back   To dietary .   Check into shingles vaccine. Shingrix  Can receive either in the office or pharmacy depending on insurance rules.

## 2017-07-23 ENCOUNTER — Other Ambulatory Visit: Payer: Self-pay | Admitting: Internal Medicine

## 2017-07-30 ENCOUNTER — Telehealth: Payer: Self-pay | Admitting: Internal Medicine
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
## 2017-07-31 MED ORDER — OMEPRAZOLE 40 MG PO CPDR: 40.0000 mg | DELAYED_RELEASE_CAPSULE | Freq: Every day | ORAL | 0 refills | Status: DC

## 2017-07-31 MED ORDER — LOSARTAN POTASSIUM 100 MG PO TABS: ORAL_TABLET | ORAL | 0 refills | Status: DC

## 2017-07-31 NOTE — Telephone Encounter (Signed)
Meds refilled to [facility] per pt request.  LM x 1

## 2017-08-04 NOTE — Telephone Encounter (Signed)
Pt aware. Nothing further needed 

## 2017-08-18 LAB — HM DIABETES EYE EXAM

## 2017-08-26 ENCOUNTER — Other Ambulatory Visit: Payer: Self-pay | Admitting: Internal Medicine

## 2017-09-30 ENCOUNTER — Other Ambulatory Visit: Payer: Self-pay | Admitting: Internal Medicine

## 2017-09-30 MED ORDER — CARVEDILOL 12.5 MG PO TABS: ORAL_TABLET | ORAL | 0 refills | Status: DC

## 2017-10-13 HISTORY — PX: CATARACT EXTRACTION, BILATERAL: SHX1313

## 2017-10-26 ENCOUNTER — Other Ambulatory Visit: Payer: Self-pay | Admitting: Internal Medicine

## 2017-10-26 MED ORDER — CARVEDILOL 12.5 MG PO TABS: ORAL_TABLET | ORAL | 0 refills | Status: DC

## 2017-10-26 NOTE — Progress Notes (Signed)
Chief Complaint  Patient presents with  . Annual Exam    Pt is fasting    HPI: Holly Beasley 67 y.o. comes in today for Preventive Medicare exam and Chronic disease management   BG /DM  Has been controlled     Ok taking 4 metformin per day _0 bp no change  Svt no  Episodes .  Leg swells off and on   Down now   Depression   Ok some stresses  External issues .  HLD  No se of meds   Derm hand better  feeet  Stable no candidate for some of meds  Following o worse    Health Maintenance  Topic Date Due  . OPHTHALMOLOGY EXAM  07/29/2017  . FOOT EXAM  10/17/2017  . HEMOGLOBIN A1C  01/19/2018  . TETANUS/TDAP  04/03/2018  . MAMMOGRAM  07/30/2018  . COLONOSCOPY  02/08/2019  . INFLUENZA VACCINE  Completed  . DEXA SCAN  Completed  . Hepatitis C Screening  Completed  . PNA vac Low Risk Adult  Completed   Health Maintenance Review LIFESTYLE:  Exercise:    3 x per week.   Limitation  Spinal stenosis  Hx of shots  Tobacco/ETS: no Alcohol:  no Sugar beverages: no Sleep: 5- 8 and naps  Not a change.   Drug use: no HH:  2  3 cats    Hearing:   Vision:  No limitations at present . Last eye check UTD  To have  Cataract surgery  This spring .    Both eyes   Safety:  Has smoke detector and wears seat belts.  No firearms. No excess sun exposure. Sees dentist regularly.  Falls:   Memory: Felt to be  [employer] forgetting.   , no concern from her or her family.  Depression: No anhedonia unusual crying or depressive symptoms on meds   Nutrition: Eats well balanced diet; adequate calcium and vitamin D. No swallowing chewing problems.  Injury: no major injuries in the last six months.  Other healthcare providers:  Reviewed today .  Social:  Lives with spouse married. 3  Cats pets.   Preventive parameters: up-to-date  Reviewed   ADLS:   There are no problems or need for assistance  driving, feeding, obtaining food, dressing, toileting and bathing, managing money  using phone. She is independent.    ROS:  GEN/ HEENT: No fever, significant weight changes sweats headaches vision problems hearing changes, CV/ PULM; No chest pain shortness of breath cough, syncope,edema  change in exercise tolerance. GI /GU: No adominal pain, vomiting, change in bowel habits. No blood in the stool. No significant GU symptoms. SKIN/HEME: ,no acute skin rashes suspicious lesions or bleeding. No lymphadenopathy, nodules, masses.  NEURO/ PSYCH:  No neurologic signs such as weakness numbness. No depression anxiety. IMM/ Allergy: No unusual infections.  Allergy .   REST of 12 system review negative except as per HPI   Past Medical History:  Diagnosis Date  . Allergy   . Depression   . Diabetes mellitus without complication ([place])   . Dry eyes   . GERD (gastroesophageal reflux disease)   . History of ETOH abuse    recovering  in remission  . Hyperlipidemia   . Hypertension   . Osteoarthritis   . Osteopenia    dexa -1.7 h -1.35   . Scoliosis    leg length discrepancy  . Skin cancer    basal cell nose  .  Sleep apnea    did not meet criteria for cpap-uses a mouthpiece  . SVT (supraventricular tachycardia) ([place]) 08/15/2016  . Varicose veins   . Wears contact lenses     Family History  Problem Relation Age of Onset  . Lymphoma Mother   . Diabetes Mother   . Hyperlipidemia Mother   . Heart failure Father   . Hyperlipidemia Father   . Arthritis Brother   . Alcohol abuse Brother   . Alcohol abuse Brother     Social History   Socioeconomic History  . Marital status: Married    Spouse name: None  . Number of children: None  . Years of education: None  . Highest education level: None  Social Needs  . Financial resource strain: None  . Food insecurity - worry: None  . Food insecurity - inability: None  . Transportation needs - medical: None  . Transportation needs - non-medical: None  Occupational History  . None  Tobacco Use  . Smoking status:  Never Smoker  . Smokeless tobacco: Never Used  Substance and Sexual Activity  . Alcohol use: No    Alcohol/week: 0.0 oz    Comment: recovering-1983  . Drug use: No  . Sexual activity: None  Other Topics Concern  . 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[employer]   Married   ETOH recovering   [place] 2    Has family in [place]          Outpatient Encounter Medications as of 10/27/2017  Medication Sig  . atorvastatin (LIPITOR) 40 MG tablet TAKE 1 TABLET (40 MG TOTAL) BY MOUTH DAILY.  . calcipotriene-betamethasone (TACLONEX) ointment as needed.  . carvedilol (COREG) 12.5 MG tablet TAKE 1 AND 1/2 TABLETS TWICE A DAY. Please make yearly appt with Dr. Rayann Heman. 2nd attempt  . Cyanocobalamin (VITAMIN B-12 PO) Take 5,000 mcg by mouth daily.  . cycloSPORINE (RESTASIS) 0.05 % ophthalmic emulsion Place 1 drop into both eyes at bedtime.  . DULoxetine (CYMBALTA) 60 MG capsule Take 2 capsules (120 mg total) by mouth at bedtime.  Marland Kitchen losartan (COZAAR) 100 MG tablet TAKE 1 TABLET (100 MG TOTAL) BY MOUTH DAILY.  . metFORMIN (GLUCOPHAGE-XR) 500 MG 24 hr tablet TAKE 3 TABLETS EVERY DAY (Patient taking differently: TAKE 4 TABLETS EVERY DAY)  . montelukast (SINGULAIR) 10 MG tablet TAKE 1 TABLET (10 MG TOTAL) BY MOUTH AT BEDTIME.  . MULTIPLE VITAMIN PO Take 1 tablet by mouth daily.  . Omega-3 Fatty Acids (FISH OIL) 1000 MG CAPS Take 1 capsule by mouth daily.  Marland Kitchen omeprazole (PRILOSEC) 40 MG capsule Take 1 capsule (40 mg total) by mouth daily.  Marland Kitchen OMNARIS 50 MCG/ACT nasal spray PLACE 2 SPRAYS INTO BOTH NOSTRILS DAILY.   No facility-administered encounter medications on file as of 10/27/2017.     EXAM:  BP 130/70 (BP Location: Right Arm, Patient Position: Sitting, Cuff Size: Normal)   Pulse 76   Temp 98.4 F (36.9 C) (Oral)   Ht _0  (1.676 m)   Wt 187 lb 1.6 oz (84.9 kg)   SpO2 97%   BMI  30.20 kg/m   Body mass index is 30.2 kg/m.  Physical Exam: Vital signs reviewed YYT:KPTW is a well-developed well-nourished alert cooperative   who appears stated age in no acute distress.  HEENT: normocephalic atraumatic , Eyes: PERRL EOM's full,  conjunctiva clear, Nares: paten,t no deformity discharge or tenderness., Ears: no deformity EAC's clear TMs with normal landmarks. Mouth: clear OP, no lesions, edema.  Moist mucous membranes. Dentition in adequate repair. NECK: supple without masses, thyromegaly or bruits. CHEST/PULM:  Clear to auscultation and percussion breath sounds equal no wheeze , rales or rhonchi. No chest wall deformities or tenderness. CV: PMI is nondisplaced, S1 S2 no gallops, murmurs, rubs. Peripheral pulses are full without delay.No JVD . Breast: normal by inspection . No dimpling, discharge, masses, tenderness or discharge . ABDOMEN: Bowel sounds normal nontender  No guard or rebound, no hepato splenomegal no CVA tenderness.   Extremtities:  No clubbing cyanosis or edema, no acute joint swelling or redness no focal atrophy NEURO:  Oriented x3, cranial nerves 3-12 appear to be intact, no obvious focal weakness,gait within normal limits no abnormal reflexes or asymmetrical SKIN: No acute rashes normal turgor, color, no bruising or petechiae.  Feet callous scaling toes and balls no ulcers  Lesion  PSYCH: Oriented, good eye contact, no obvious depression anxiety, cognition and judgment appear normal. LN: no cervical axillary inguinal adenopathy No noted deficits in memory, attention, and speech. Diabetic Foot Exam - Simple   Simple Foot Form Diabetic Foot exam was performed with the following findings:  Yes 10/27/2017 11:48 AM  Visual Inspection No deformities, no ulcerations, no other skin breakdown bilaterally:  Yes See comments:  Yes Sensation Testing Intact to touch and monofilament testing bilaterally:  Yes Pulse Check Posterior Tibialis and Dorsalis pulse intact  bilaterally:  Yes Comments See  Test  CPX      Lab Results  Component Value Date   WBC 5.5 10/27/2017   HGB 12.7 10/27/2017   HCT 37.8 10/27/2017   PLT 359.0 10/27/2017   GLUCOSE 181 (H) 10/27/2017   CHOL 152 10/27/2017   TRIG 115.0 10/27/2017   HDL 51.20 10/27/2017   LDLDIRECT 171.0 10/10/2016   LDLCALC 78 10/27/2017   ALT 14 10/27/2017   AST 13 10/27/2017   NA 138 10/27/2017   K 4.0 10/27/2017   CL 102 10/27/2017   CREATININE 0.69 10/27/2017   BUN 11 10/27/2017   CO2 30 10/27/2017   TSH 1.02 10/27/2017   HGBA1C 7.4 (H) 10/27/2017   MICROALBUR <0.7 10/27/2017    ASSESSMENT AND PLAN:  Discussed the following assessment and plan:  Visit for preventive health examination - Plan: Basic metabolic panel, CBC with Differential/Platelet, Hemoglobin A1c, Hepatic function panel, Lipid panel, TSH, Microalbumin / creatinine urine ratio  Medication management - Plan: Basic metabolic panel, CBC with Differential/Platelet, Hemoglobin A1c, Hepatic function panel, Lipid panel, TSH, Microalbumin / creatinine urine ratio  Controlled type 2 diabetes mellitus without complication, without long-term current use of insulin ([place]) - Plan: Basic metabolic panel, CBC with Differential/Platelet, Hemoglobin A1c, Hepatic function panel, Lipid panel, TSH, Microalbumin / creatinine urine ratio  Essential hypertension - Plan: Basic metabolic panel, CBC with Differential/Platelet, Hemoglobin A1c, Hepatic function panel, Lipid panel, TSH, Microalbumin / creatinine urine ratio  Hyperlipidemia, unspecified hyperlipidemia type - Plan: Basic metabolic panel, CBC with Differential/Platelet, Hemoglobin A1c, Hepatic function panel, Lipid panel, TSH, Microalbumin / creatinine urine ratio  Patient Care Team: Obryan Radu, Standley Brooking, MD as PCP - General Camillo Flaming, OD as Referring Physician (Optometry) Neldon Mc, Donnamarie Poag, MD as Attending Physician (General Practice) Rolm Bookbinder, MD as Consulting Physician  (Dermatology) Norma Fredrickson, MD as Consulting Physician (Psychiatry) Mal Misty, MD as Consulting Physician (Vascular Surgery) Thompson Grayer, MD as Consulting Physician (Cardiology)  Patient  Instructions    Will notify you  of labs when available.  If all ok then rov in 6 months .   Make yearly appt with cards  Before you cataract surgery .   Continue lifestyle intervention healthy eating and exercise . Keep feet moisturized to avoid  Excess irritation  And secondary infection Wt Readings from Last 3 Encounters:  10/27/17 187 lb 1.6 oz (84.9 kg)  07/21/17 192 lb 6.4 oz (87.3 kg)  04/14/17 196 lb 11.2 oz (89.2 kg)       Preventive Care 65 Years and Older, Female Preventive care refers to lifestyle choices and visits with your health care provider that can promote health and wellness. What does preventive care include?  A yearly physical exam. This is also called an annual well check.  Dental exams once or twice a year.  Routine eye exams. Ask your health care provider how often you should have your eyes checked.  Personal lifestyle choices, including: ? Daily care of your teeth and gums. ? Regular physical activity. ? Eating a healthy diet. ? Avoiding tobacco and drug use. ? Limiting alcohol use. ? Practicing safe sex. ? Taking low-dose aspirin every day. ? Taking vitamin and mineral supplements as recommended by your health care provider. What happens during an annual well check? The services and screenings done by your health care provider during your annual well check will depend on your age, overall health, lifestyle risk factors, and family history of disease. Counseling Your health care provider may ask you questions about your:  Alcohol use.  Tobacco use.  Drug use.  Emotional well-being.  Home and relationship well-being.  Sexual activity.  Eating habits.  History of falls.  Memory and ability to understand (cognition).  Work and work  Statistician.  Reproductive health.  Screening You may have the following tests or measurements:  Height, weight, and BMI.  Blood pressure.  Lipid and cholesterol levels. These may be checked every 5 years, or more frequently if you are over 5 years old.  Skin check.  Lung cancer screening. You may have this screening every year starting at age 4 if you have a 30-pack-year history of smoking and currently smoke or have quit within the past 15 years.  Fecal occult blood test (FOBT) of the stool. You may have this test every year starting at age 57.  Flexible sigmoidoscopy or colonoscopy. You may have a sigmoidoscopy every 5 years or a colonoscopy every 10 years starting at age 56.  Hepatitis C blood test.  Hepatitis B blood test.  Sexually transmitted disease (STD) testing.  Diabetes screening. This is done by checking your blood sugar (glucose) after you have not eaten for a while (fasting). You may have this done every 1-3 years.  Bone density scan. This is done to screen for osteoporosis. You may have this done starting at age 27.  Mammogram. This may be done every 1-2 years. Talk to your health care provider about how often you should have regular mammograms.  Talk with your health care provider about your test results, treatment options, and if necessary, the need for more tests. Vaccines Your health care provider may recommend certain vaccines, such as:  Influenza vaccine. This is recommended every year.  Tetanus, diphtheria, and acellular pertussis (Tdap, Td) vaccine. You may need a Td booster every 10 years.  Varicella vaccine. You may need this if you have not been vaccinated.  Zoster vaccine. You may need this after age 25.  Measles, mumps,  and rubella (MMR) vaccine. You may need at least one dose of MMR if you were born in 1957 or later. You may also need a second dose.  Pneumococcal 13-valent conjugate (PCV13) vaccine. One dose is recommended after age  46.  Pneumococcal polysaccharide (PPSV23) vaccine. One dose is recommended after age 72.  Meningococcal vaccine. You may need this if you have certain conditions.  Hepatitis A vaccine. You may need this if you have certain conditions or if you travel or work in places where you may be exposed to hepatitis A.  Hepatitis B vaccine. You may need this if you have certain conditions or if you travel or work in places where you may be exposed to hepatitis B.  Haemophilus influenzae type b (Hib) vaccine. You may need this if you have certain conditions.  Talk to your health care provider about which screenings and vaccines you need and how often you need them. This information is not intended to replace advice given to you by your health care provider. Make sure you discuss any questions you have with your health care provider. Document Released: 10/26/2015 Document Revised: 06/18/2016 Document Reviewed: 07/31/2015 Elsevier Interactive Patient Education  2018 [place]. Rashawn Rolon M.D.

## 2017-10-27 ENCOUNTER — Encounter: Payer: Self-pay | Admitting: Internal Medicine

## 2017-10-27 ENCOUNTER — Ambulatory Visit (INDEPENDENT_AMBULATORY_CARE_PROVIDER_SITE_OTHER): Payer: 59 | Admitting: Internal Medicine

## 2017-10-27 VITALS — BP 130/70 | HR 76 | Temp 98.4°F | Ht 66.0 in | Wt 187.1 lb

## 2017-10-27 DIAGNOSIS — Z0000 Encounter for general adult medical examination without abnormal findings: Secondary | ICD-10-CM

## 2017-10-27 DIAGNOSIS — I10 Essential (primary) hypertension: Secondary | ICD-10-CM | POA: Diagnosis not present

## 2017-10-27 DIAGNOSIS — Z79899 Other long term (current) drug therapy: Secondary | ICD-10-CM

## 2017-10-27 DIAGNOSIS — E119 Type 2 diabetes mellitus without complications: Secondary | ICD-10-CM

## 2017-10-27 DIAGNOSIS — E785 Hyperlipidemia, unspecified: Secondary | ICD-10-CM | POA: Diagnosis not present

## 2017-10-27 LAB — TSH: TSH: 1.02 u[IU]/mL (ref 0.35–4.50)

## 2017-10-27 LAB — CBC WITH DIFFERENTIAL/PLATELET
BASOS ABS: 0 10*3/uL (ref 0.0–0.1)
Basophils Relative: 0.7 % (ref 0.0–3.0)
Eosinophils Absolute: 0.2 10*3/uL (ref 0.0–0.7)
Eosinophils Relative: 3.8 % (ref 0.0–5.0)
HEMATOCRIT: 37.8 % (ref 36.0–46.0)
HEMOGLOBIN: 12.7 g/dL (ref 12.0–15.0)
LYMPHS PCT: 39.3 % (ref 12.0–46.0)
Lymphs Abs: 2.2 10*3/uL (ref 0.7–4.0)
MCHC: 33.5 g/dL (ref 30.0–36.0)
MCV: 93.7 fl (ref 78.0–100.0)
MONOS PCT: 8.7 % (ref 3.0–12.0)
Monocytes Absolute: 0.5 10*3/uL (ref 0.1–1.0)
Neutro Abs: 2.6 10*3/uL (ref 1.4–7.7)
Neutrophils Relative %: 47.5 % (ref 43.0–77.0)
Platelets: 359 10*3/uL (ref 150.0–400.0)
RBC: 4.03 Mil/uL (ref 3.87–5.11)
RDW: 13.2 % (ref 11.5–15.5)
WBC: 5.5 10*3/uL (ref 4.0–10.5)

## 2017-10-27 LAB — BASIC METABOLIC PANEL
BUN: 11 mg/dL (ref 6–23)
CALCIUM: 9.3 mg/dL (ref 8.4–10.5)
CO2: 30 meq/L (ref 19–32)
Chloride: 102 mEq/L (ref 96–112)
Creatinine, Ser: 0.69 mg/dL (ref 0.40–1.20)
GFR: 90.29 mL/min (ref >60.00)
Glucose, Bld: 181 mg/dL — ABNORMAL HIGH (ref 70–99)
Potassium: 4 mEq/L (ref 3.5–5.1)
SODIUM: 138 meq/L (ref 135–145)

## 2017-10-27 LAB — HEPATIC FUNCTION PANEL
ALT: 14 U/L (ref 0–35)
AST: 13 U/L (ref 0–37)
Albumin: 4.2 g/dL (ref 3.5–5.2)
Alkaline Phosphatase: 57 U/L (ref 39–117)
Bilirubin, Direct: 0.1 mg/dL (ref 0.0–0.3)
Total Bilirubin: 0.6 mg/dL (ref 0.2–1.2)
Total Protein: 6.9 g/dL (ref 6.0–8.3)

## 2017-10-27 LAB — LIPID PANEL
CHOL/HDL RATIO: 3
Cholesterol: 152 mg/dL (ref 0–200)
HDL: 51.2 mg/dL (ref >39.00)
LDL CALC: 78 mg/dL (ref 0–99)
NonHDL: 101.17
Triglycerides: 115 mg/dL (ref 0.0–149.0)
VLDL: 23 mg/dL (ref 0.0–40.0)

## 2017-10-27 LAB — MICROALBUMIN / CREATININE URINE RATIO
CREATININE, U: 70.8 mg/dL
Microalb Creat Ratio: 1 mg/g (ref 0.0–30.0)

## 2017-10-27 LAB — HEMOGLOBIN A1C: Hgb A1c MFr Bld: 7.4 % — ABNORMAL HIGH (ref 4.6–6.5)

## 2017-10-27 NOTE — Patient Instructions (Addendum)
Will notify you  of labs when available.  If all ok then rov in 6 months .   Make yearly appt with cards  Before you cataract surgery .   Continue lifestyle intervention healthy eating and exercise . Keep feet moisturized to avoid  Excess irritation  And secondary infection Wt Readings from Last 3 Encounters:  10/27/17 187 lb 1.6 oz (84.9 kg)  07/21/17 192 lb 6.4 oz (87.3 kg)  04/14/17 196 lb 11.2 oz (89.2 kg)       Preventive Care 65 Years and Older, Female Preventive care refers to lifestyle choices and visits with your health care provider that can promote health and wellness. What does preventive care include?  A yearly physical exam. This is also called an annual well check.  Dental exams once or twice a year.  Routine eye exams. Ask your health care provider how often you should have your eyes checked.  Personal lifestyle choices, including: ? Daily care of your teeth and gums. ? Regular physical activity. ? Eating a healthy diet. ? Avoiding tobacco and drug use. ? Limiting alcohol use. ? Practicing safe sex. ? Taking low-dose aspirin every day. ? Taking vitamin and mineral supplements as recommended by your health care provider. What happens during an annual well check? The services and screenings done by your health care provider during your annual well check will depend on your age, overall health, lifestyle risk factors, and family history of disease. Counseling Your health care provider may ask you questions about your:  Alcohol use.  Tobacco use.  Drug use.  Emotional well-being.  Home and relationship well-being.  Sexual activity.  Eating habits.  History of falls.  Memory and ability to understand (cognition).  Work and work Statistician.  Reproductive health.  Screening You may have the following tests or measurements:  Height, weight, and BMI.  Blood pressure.  Lipid and cholesterol levels. These may be checked every 5 years, or  more frequently if you are over 53 years old.  Skin check.  Lung cancer screening. You may have this screening every year starting at age 20 if you have a 30-pack-year history of smoking and currently smoke or have quit within the past 15 years.  Fecal occult blood test (FOBT) of the stool. You may have this test every year starting at age 44.  Flexible sigmoidoscopy or colonoscopy. You may have a sigmoidoscopy every 5 years or a colonoscopy every 10 years starting at age 4.  Hepatitis C blood test.  Hepatitis B blood test.  Sexually transmitted disease (STD) testing.  Diabetes screening. This is done by checking your blood sugar (glucose) after you have not eaten for a while (fasting). You may have this done every 1-3 years.  Bone density scan. This is done to screen for osteoporosis. You may have this done starting at age 6.  Mammogram. This may be done every 1-2 years. Talk to your health care provider about how often you should have regular mammograms.  Talk with your health care provider about your test results, treatment options, and if necessary, the need for more tests. Vaccines Your health care provider may recommend certain vaccines, such as:  Influenza vaccine. This is recommended every year.  Tetanus, diphtheria, and acellular pertussis (Tdap, Td) vaccine. You may need a Td booster every 10 years.  Varicella vaccine. You may need this if you have not been vaccinated.  Zoster vaccine. You may need this after age 85.  Measles, mumps, and rubella (MMR) vaccine.  You may need at least one dose of MMR if you were born in 1957 or later. You may also need a second dose.  Pneumococcal 13-valent conjugate (PCV13) vaccine. One dose is recommended after age 27.  Pneumococcal polysaccharide (PPSV23) vaccine. One dose is recommended after age 25.  Meningococcal vaccine. You may need this if you have certain conditions.  Hepatitis A vaccine. You may need this if you have  certain conditions or if you travel or work in places where you may be exposed to hepatitis A.  Hepatitis B vaccine. You may need this if you have certain conditions or if you travel or work in places where you may be exposed to hepatitis B.  Haemophilus influenzae type b (Hib) vaccine. You may need this if you have certain conditions.  Talk to your health care provider about which screenings and vaccines you need and how often you need them. This information is not intended to replace advice given to you by your health care provider. Make sure you discuss any questions you have with your health care provider. Document Released: 10/26/2015 Document Revised: 06/18/2016 Document Reviewed: 07/31/2015 Elsevier Interactive Patient Education  [employer].

## 2017-11-05 ENCOUNTER — Other Ambulatory Visit: Payer: Self-pay | Admitting: Internal Medicine

## 2017-11-12 ENCOUNTER — Encounter: Payer: Self-pay | Admitting: Nurse Practitioner

## 2017-11-19 NOTE — Progress Notes (Signed)
Electrophysiology Office Note Date: 11/20/2017  ID:  Holly Beasley, DOB 22-Apr-1951, MRN [id number]  PCP: Burnis Medin, MD Electrophysiologist: Rayann Heman  CC: SVT follow up  Holly Beasley is a 67 y.o. female seen today for Dr Rayann Heman.  She presents today for routine electrophysiology followup.  Since last being seen in our clinic, the patient reports doing very well.  She has rare seconds of palpitations that are self limiting. She denies chest pain, dyspnea, PND, orthopnea, nausea, vomiting, dizziness, syncope, edema, weight gain, or early satiety.  Past Medical History:  Diagnosis Date  . Depression   . Diabetes mellitus without complication ([place])   . GERD (gastroesophageal reflux disease)   . History of ETOH abuse    recovering  in remission  . Hyperlipidemia   . Hypertension   . Osteoarthritis   . Osteopenia    dexa -1.7 h -1.35   . Scoliosis    leg length discrepancy  . Skin cancer    basal cell nose  . Sleep apnea    did not meet criteria for cpap-uses a mouthpiece  . SVT (supraventricular tachycardia) ([place]) 08/15/2016  . Varicose veins   . Wears contact lenses    Past Surgical History:  Procedure Laterality Date  . BREAST BIOPSY     2003-rt  . COLONOSCOPY W/ POLYPECTOMY     precancerous   2004  . KNEE ARTHROSCOPY WITH LATERAL MENISECTOMY Left 11/10/2014   Procedure: LEFT KNEE ARTHROSCOPY PARTIAL LATERAL MENISECTOMY CHONDROPLASTY;  Surgeon: Hessie Dibble, MD;  Location: [facility];  Service: Orthopedics;  Laterality: Left;  . NASAL SINUS SURGERY  11-13-2008   polyps removed  . skin cancer removal     basal cell face  . TONSILLECTOMY AND ADENOIDECTOMY  1967  . VENOUS ABLATION  2015   Dr Kellie Simmering    Current Outpatient Medications  Medication Sig Dispense Refill  . atorvastatin (LIPITOR) 40 MG tablet TAKE 1 TABLET (40 MG TOTAL) BY MOUTH DAILY. 90 tablet 0  . calcipotriene-betamethasone (TACLONEX) ointment as needed.  2  . carvedilol  (COREG) 12.5 MG tablet TAKE 1 AND 1/2 TABLETS TWICE A DAY. Please make yearly appt with Dr. Rayann Heman. 2nd attempt 45 tablet 0  . Cyanocobalamin (VITAMIN B-12 PO) Take 5,000 mcg by mouth daily.    . cycloSPORINE (RESTASIS) 0.05 % ophthalmic emulsion Place 1 drop into both eyes at bedtime.    . DULoxetine (CYMBALTA) 60 MG capsule Take 2 capsules (120 mg total) by mouth at bedtime. 180 capsule 1  . losartan (COZAAR) 100 MG tablet TAKE 1 TABLET BY MOUTH EVERY DAY 90 tablet 0  . metFORMIN (GLUCOPHAGE-XR) 500 MG 24 hr tablet TAKE 4 TABLETS EVERY DAY 270 tablet 0  . montelukast (SINGULAIR) 10 MG tablet TAKE 1 TABLET (10 MG TOTAL) BY MOUTH AT BEDTIME. 90 tablet 1  . MULTIPLE VITAMIN PO Take 1 tablet by mouth daily.    . Omega-3 Fatty Acids (FISH OIL) 1000 MG CAPS Take 1 capsule by mouth daily.    Marland Kitchen omeprazole (PRILOSEC) 40 MG capsule TAKE 1 CAPSULE BY MOUTH EVERY DAY 90 capsule 0  . OMNARIS 50 MCG/ACT nasal spray PLACE 2 SPRAYS INTO BOTH NOSTRILS DAILY. 12.5 g 5   No current facility-administered medications for this visit.     Allergies:   Amoxicillin; Bactrim [sulfamethoxazole-trimethoprim]; Cephalexin; and Sulfamethoxazole   Social History: Social History   Socioeconomic History  . Marital status: Married    Spouse name: Not on file  .  Number of children: Not on file  . Years of education: Not on file  . Highest education level: Not on file  Social Needs  . Financial resource strain: Not on file  . Food insecurity - worry: Not on file  . Food insecurity - inability: Not on file  . Transportation needs - medical: Not on file  . Transportation needs - non-medical: Not on file  Occupational History  . Not on file  Tobacco Use  . Smoking status: Never Smoker  . Smokeless tobacco: Never Used  Substance and Sexual Activity  . Alcohol use: No    Alcohol/week: 0.0 oz    Comment: recovering-1983  . Drug use: No  . Sexual activity: Not on file  Other Topics Concern  . 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[employer]   Married   ETOH recovering   [place] 2    Has family in [place]          Family History: Family History  Problem Relation Age of Onset  . Lymphoma Mother   . Diabetes Mother   . Hyperlipidemia Mother   . Heart failure Father   . Hyperlipidemia Father   . Arthritis Brother   . Alcohol abuse Brother   . Alcohol abuse Brother     Review of Systems: All other systems reviewed and are otherwise negative except as noted above.   Physical Exam: VS:  BP 124/76   Pulse 70   Ht 5\' 8"  (1.727 m)   Wt 186 lb (84.4 kg)   BMI 28.28 kg/m  , BMI Body mass index is 28.28 kg/m. Wt Readings from Last 3 Encounters:  11/20/17 186 lb (84.4 kg)  10/27/17 187 lb 1.6 oz (84.9 kg)  07/21/17 192 lb 6.4 oz (87.3 kg)    GEN- The patient is well appearing, alert and oriented x 3 today.   HEENT: normocephalic, atraumatic; sclera clear, conjunctiva pink; hearing intact; oropharynx clear; neck supple Lungs- Clear to ausculation bilaterally, normal work of breathing.  No wheezes, rales, rhonchi Heart- Regular rate and rhythm  GI- soft, non-tender, non-distended, bowel sounds present  Extremities- no clubbing, cyanosis, or edema  MS- no significant deformity or atrophy Skin- warm and dry, no rash or lesion  Psych- euthymic mood, full affect Neuro- strength and sensation are intact   EKG:  EKG is not ordered today.  Recent Labs: 10/27/2017: ALT 14; BUN 11; Creatinine, Ser 0.69; Hemoglobin 12.7; Platelets 359.0; Potassium 4.0; Sodium 138; TSH 1.02    Other studies Reviewed: Additional studies/ records that were reviewed today include: Dr Jackalyn Lombard office notes  Assessment and Plan: 1.  SVT Burden low on Coreg Has previously been offered ablation for short RP tachycardia, she continues to prefer medical therapy She prefers to call with  recurrence of palpitations  2.  HTN Stable No change required today  Current medicines are reviewed at length with the patient today.   The patient does not have concerns regarding her medicines.  The following changes were made today:  none  Labs/ tests ordered today include: none No orders of the defined types were placed in this encounter.    Disposition:   Follow up with EP prn   Signed, Chanetta Marshall, NP 11/20/2017 10:00 AM   [place] [address] [phone] (  office) [phone] (fax)

## 2017-11-20 ENCOUNTER — Ambulatory Visit (INDEPENDENT_AMBULATORY_CARE_PROVIDER_SITE_OTHER): Payer: 59 | Admitting: Nurse Practitioner

## 2017-11-20 ENCOUNTER — Encounter: Payer: Self-pay | Admitting: Nurse Practitioner

## 2017-11-20 VITALS — BP 124/76 | HR 70 | Ht 68.0 in | Wt 186.0 lb

## 2017-11-20 DIAGNOSIS — I119 Hypertensive heart disease without heart failure: Secondary | ICD-10-CM

## 2017-11-20 DIAGNOSIS — I471 Supraventricular tachycardia: Secondary | ICD-10-CM | POA: Diagnosis not present

## 2017-11-27 ENCOUNTER — Other Ambulatory Visit: Payer: Self-pay | Admitting: Internal Medicine

## 2017-12-01 NOTE — Progress Notes (Signed)
Chief Complaint  Patient presents with  . URI    Pt states that sx's have been present for about 3 weeks. Started with sore throat and congestion. Pt denies fever. Pt states that she feels "wiped out". Pt states that she had head congestion with alot of mucu sx 2 weeks, green blood tinged mucus. Pt states that her sx's are starting to resolve. Pt has been taking Singulair, dayquil and Claritin D.     HPI: Holly Beasley 68 y.o. SDA onset like a cough and cold  And  Ongoing no real fever  Thinks today getting some better but has had  Mucous green and some blood yesterday  No face pain but congested( no serious pain since she had sinus surgery)  No cp sob  Tachy.  To leave town end of week [place]  Parents in 40 s  Wanted to get checked ROS: See pertinent positives and negatives per HPI. No vomiting rash wheezing  Past Medical History:  Diagnosis Date  . Depression   . Diabetes mellitus without complication ([place])   . GERD (gastroesophageal reflux disease)   . History of ETOH abuse    recovering  in remission  . Hyperlipidemia   . Hypertension   . Osteoarthritis   . Osteopenia    dexa -1.7 h -1.35   . Scoliosis    leg length discrepancy  . Skin cancer    basal cell nose  . Sleep apnea    did not meet criteria for cpap-uses a mouthpiece  . SVT (supraventricular tachycardia) ([place]) 08/15/2016  . Varicose veins   . Wears contact lenses     Family History  Problem Relation Age of Onset  . Lymphoma Mother   . Diabetes Mother   . Hyperlipidemia Mother   . Heart failure Father   . Hyperlipidemia Father   . Arthritis Brother   . Alcohol abuse Brother   . Alcohol abuse Brother     Social History   Socioeconomic History  . Marital status: Married    Spouse name: None  . Number of children: None  . Years of education: None  . Highest education level: None  Social Needs  . Financial resource strain: None  . Food insecurity - worry: None  . Food insecurity -  inability: None  . Transportation needs - medical: None  . Transportation needs - non-medical: None  Occupational History  . None  Tobacco Use  . Smoking status: Never Smoker  . Smokeless tobacco: Never Used  Substance and Sexual Activity  . Alcohol use: No    Alcohol/week: 0.0 oz    Comment: recovering-1983  . Drug use: No  . Sexual activity: None  Other Topics Concern  . 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 atorvastatin (LIPITOR) 40 MG tablet TAKE 1 TABLET (40 MG TOTAL) BY MOUTH DAILY. 90 tablet 0  . calcipotriene-betamethasone (TACLONEX) ointment as needed.  2  . carvedilol (COREG) 12.5 MG tablet Take 1.5 tablets (18.75 mg total) by mouth 2 (two) times daily. 270 tablet 3  . Cyanocobalamin (VITAMIN B-12 PO) Take 5,000 mcg  by mouth daily.    . cycloSPORINE (RESTASIS) 0.05 % ophthalmic emulsion Place 1 drop into both eyes at bedtime.    . DULoxetine (CYMBALTA) 60 MG capsule Take 2 capsules (120 mg total) by mouth at bedtime. 180 capsule 1  . losartan (COZAAR) 100 MG tablet TAKE 1 TABLET BY MOUTH EVERY DAY 90 tablet 0  . metFORMIN (GLUCOPHAGE-XR) 500 MG 24 hr tablet TAKE 4 TABLETS EVERY DAY 270 tablet 0  . montelukast (SINGULAIR) 10 MG tablet TAKE 1 TABLET (10 MG TOTAL) BY MOUTH AT BEDTIME. 90 tablet 1  . MULTIPLE VITAMIN PO Take 1 tablet by mouth daily.    . Omega-3 Fatty Acids (FISH OIL) 1000 MG CAPS Take 1 capsule by mouth daily.    Marland Kitchen omeprazole (PRILOSEC) 40 MG capsule TAKE 1 CAPSULE BY MOUTH EVERY DAY 90 capsule 0  . OMNARIS 50 MCG/ACT nasal spray PLACE 2 SPRAYS INTO BOTH NOSTRILS DAILY. 12.5 g 5  . metFORMIN (GLUCOPHAGE-XR) 500 MG 24 hr tablet TAKE 3 TABLETS EVERY DAY (Patient not taking: Reported on  12/02/2017) 270 tablet 0   No facility-administered medications prior to visit.      EXAM:  BP 122/80 (BP Location: Right Arm, Patient Position: Sitting, Cuff Size: Normal)   Pulse 69   Temp 98 F (36.7 C) (Oral)   Wt 192 lb 1.6 oz (87.1 kg)   BMI 29.21 kg/m   Body mass index is 29.21 kg/m. WDWN in NAD  quiet respirations; mildly congested  somewhat hoarse. Non toxic . HEENT: Normocephalic ;atraumatic , Eyes;  PERRL, EOMs  Full, lids and conjunctiva clear,,Ears: no deformities, canals nl, TM landmarks normal, Nose: no deformity or discharge but congested;face  Non  tender Mouth : OP clear without lesion or edema . Neck: Supple without adenopathy or masses or bruits Chest:  Clear to A&P without wheezes rales or rhonchi CV:  S1-S2 no gallops or murmurs peripheral perfusion is normal Skin :nl perfusion and no acute rashes  PSYCH: pleasant and cooperative, no obvious depression or anxiety  ASSESSMENT AND PLAN:  Discussed the following assessment and plan:  Protracted URI  History of sinusitis - with surgery   Medication management If    persistent or progressive and blooding drainage then begin antibiotic  No evidence of pna  Or resp compromise.    Expectant management. And fu alarm sx  -Patient advised to return or notify health care team  if symptoms worsen ,persist or new concerns arise.  Patient Instructions  This  Probably a viral infection that is lingering  And will continue to get better . However if  Bloody nasal drainage continues like some sinus problem  Then take antibiotic  For sinusitis treatment   Your lung exam is normal today     Mariann Laster K. Siniyah Evangelist M.D.

## 2017-12-02 ENCOUNTER — Encounter: Payer: Self-pay | Admitting: Internal Medicine

## 2017-12-02 ENCOUNTER — Ambulatory Visit (INDEPENDENT_AMBULATORY_CARE_PROVIDER_SITE_OTHER): Payer: 59 | Admitting: Internal Medicine

## 2017-12-02 VITALS — BP 122/80 | HR 69 | Temp 98.0°F | Wt 192.1 lb

## 2017-12-02 DIAGNOSIS — Z8709 Personal history of other diseases of the respiratory system: Secondary | ICD-10-CM

## 2017-12-02 DIAGNOSIS — Z79899 Other long term (current) drug therapy: Secondary | ICD-10-CM | POA: Diagnosis not present

## 2017-12-02 DIAGNOSIS — J069 Acute upper respiratory infection, unspecified: Secondary | ICD-10-CM

## 2017-12-02 MED ORDER — DOXYCYCLINE HYCLATE 100 MG PO TABS: 100.0000 mg | ORAL_TABLET | Freq: Two times a day (BID) | ORAL | 0 refills | Status: DC

## 2017-12-02 NOTE — Patient Instructions (Addendum)
This  Probably a viral infection that is lingering  And will continue to get better . However if  Bloody nasal drainage continues like some sinus problem  Then take antibiotic  For sinusitis treatment   Your lung exam is normal today

## 2017-12-18 ENCOUNTER — Other Ambulatory Visit: Payer: Self-pay | Admitting: Internal Medicine

## 2017-12-23 ENCOUNTER — Other Ambulatory Visit: Payer: Self-pay | Admitting: Internal Medicine

## 2017-12-31 NOTE — Telephone Encounter (Signed)
Refill denied for abx at this time Left detailed message on machine letting patient know that we received a refill request for the abx. If this is still needed pt asked to call back and request. Nothing further needed.

## 2018-01-04 ENCOUNTER — Other Ambulatory Visit: Payer: Self-pay | Admitting: Internal Medicine

## 2018-02-01 ENCOUNTER — Other Ambulatory Visit: Payer: Self-pay | Admitting: Internal Medicine

## 2018-02-18 ENCOUNTER — Other Ambulatory Visit: Payer: Self-pay | Admitting: Internal Medicine

## 2018-02-25 ENCOUNTER — Telehealth: Payer: Self-pay | Admitting: Internal Medicine

## 2018-02-25 NOTE — Telephone Encounter (Signed)
Waiting for form to make it to my desk

## 2018-02-25 NOTE — Telephone Encounter (Signed)
G[employer] Clearance to be filled out.  Placed in Dr's folder.  Mail in attached stamped envelope back to patient.

## 2018-03-02 NOTE — Telephone Encounter (Signed)
Gym Clearance letter is in your red folder to be signed. Pt is wanting to continue her [employer] membership but they are requiring a letter to clear.  

## 2018-03-02 NOTE — Telephone Encounter (Signed)
Form  Letter signed

## 2018-03-04 NOTE — Telephone Encounter (Signed)
Formed placed in the outgoing mail per patients request.

## 2018-03-10 ENCOUNTER — Encounter: Payer: Self-pay | Admitting: Internal Medicine

## 2018-03-30 ENCOUNTER — Other Ambulatory Visit: Payer: Self-pay | Admitting: Internal Medicine

## 2018-04-23 NOTE — Progress Notes (Signed)
Chief Complaint  Patient presents with  . Follow-up    diabetes    HPI: Holly Beasley 67 y.o. come in for   6 mos Chronic disease management visit   Has lost some weight but not sure a1c is better   Dry finger   Tips  Hard to check  bg capillary   Asks about  ? Continuous monitoring  Therapy   Bp ok about 140 or below  No recurrence of svt at this time Sees derm   ROS: See pertinent positives and negatives per HPI.  Past Medical History:  Diagnosis Date  . Depression   . Diabetes mellitus without complication ([place])   . GERD (gastroesophageal reflux disease)   . History of ETOH abuse    recovering  in remission  . Hyperlipidemia   . Hypertension   . Osteoarthritis   . Osteopenia    dexa -1.7 h -1.35   . Scoliosis    leg length discrepancy  . Skin cancer    basal cell nose  . Sleep apnea    did not meet criteria for cpap-uses a mouthpiece  . SVT (supraventricular tachycardia) ([place]) 08/15/2016  . Varicose veins   . Wears contact lenses     Family History  Problem Relation Age of Onset  . Lymphoma Mother   . Diabetes Mother   . Hyperlipidemia Mother   . Heart failure Father   . Hyperlipidemia Father   . Arthritis Brother   . Alcohol abuse Brother   . Alcohol abuse Brother     Social History   Socioeconomic History  . Marital status: Married    Spouse name: Not on file  . Number of children: Not on file  . Years of education: Not on file  . Highest education level: Not on file  Occupational History  . Not on file  Social Needs  . Financial resource strain: Not on file  . Food insecurity:    Worry: Not on file    Inability: Not on file  . Transportation needs:    Medical: Not on file    Non-medical: Not on file  Tobacco Use  . Smoking status: Never Smoker  . Smokeless tobacco: Never Used  Substance and Sexual Activity  . Alcohol use: No    Alcohol/week: 0.0 oz    Comment: recovering-1983  . Drug use: No  . Sexual activity: Not on file    Lifestyle  . Physical activity:    Days per week: Not on file    Minutes per session: Not on file  . Stress: Not on file  Relationships  . Social connections:    Talks on phone: Not on file    Gets together: Not on file    Attends religious service: Not on file    Active member of club or organization: Not on file    Attends meetings of clubs or organizations: Not on file    Relationship status: Not on file  Other Topics Concern  . 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 atorvastatin (LIPITOR) 40 MG tablet TAKE 1 TABLET (  40 MG TOTAL) BY MOUTH DAILY. 90 tablet 0  . calcipotriene-betamethasone (TACLONEX) ointment as needed.  2  . carvedilol (COREG) 12.5 MG tablet Take 1.5 tablets (18.75 mg total) by mouth 2 (two) times daily. 270 tablet 3  . Cyanocobalamin (VITAMIN B-12 PO) Take 5,000 mcg by mouth daily.    . cycloSPORINE (RESTASIS) 0.05 % ophthalmic emulsion Place 1 drop into both eyes at bedtime.    . DULoxetine (CYMBALTA) 60 MG capsule TAKE 2 CAPSULES AT BEDTIME 180 capsule 0  . losartan (COZAAR) 100 MG tablet TAKE 1 TABLET BY MOUTH EVERY DAY 90 tablet 0  . metFORMIN (GLUCOPHAGE-XR) 500 MG 24 hr tablet TAKE 4 TABLETS EVERY DAY 270 tablet 0  . montelukast (SINGULAIR) 10 MG tablet TAKE 1 TABLET (10 MG TOTAL) BY MOUTH AT BEDTIME. 90 tablet 1  . MULTIPLE VITAMIN PO Take 1 tablet by mouth daily.    Marland Kitchen omeprazole (PRILOSEC) 40 MG capsule TAKE 1 CAPSULE BY MOUTH EVERY DAY 90 capsule 0  . OMNARIS 50 MCG/ACT nasal spray PLACE 2 SPRAYS INTO BOTH NOSTRILS DAILY. 12.5 g 5  . Omega-3 Fatty Acids (FISH OIL) 1000 MG CAPS Take 1 capsule by mouth daily.    Marland Kitchen doxycycline (VIBRA-TABS) 100 MG tablet Take 1 tablet (100 mg  total) by mouth 2 (two) times daily. (Patient not taking: Reported on 04/26/2018) 14 tablet 0  . metFORMIN (GLUCOPHAGE-XR) 500 MG 24 hr tablet TAKE 3 TABLETS EVERY DAY (Patient not taking: Reported on 04/26/2018) 270 tablet 0   No facility-administered medications prior to visit.      EXAM:  BP (!) 145/79   Pulse 80   Temp 98.3 F (36.8 C)   Wt 191 lb (86.6 kg)   BMI 29.04 kg/m   Body mass index is 29.04 kg/m.  GENERAL: vitals reviewed and listed above, alert, oriented, appears well hydrated and in no acute distress facial erythema  And finger tips cracked  Pink peelingHEENT: atraumatic, conjunctiva  clear, no obvious abnormalities on inspection of external nose and ears OP : no lesion edema or exudate  NECK: no obvious masses on inspection palpation  LUNGS: clear to auscultation bilaterally, no wheezes, rales or rhonchi, good air movement CV: HRRR, no clubbing cyanosis  nl cap refill  MS: moves all extremities without noticeable focal  abnormality PSYCH: pleasant and cooperative, no obvious depression or anxiety Lab Results  Component Value Date   WBC 5.5 10/27/2017   HGB 12.7 10/27/2017   HCT 37.8 10/27/2017   PLT 359.0 10/27/2017   GLUCOSE 181 (H) 10/27/2017   CHOL 152 10/27/2017   TRIG 115.0 10/27/2017   HDL 51.20 10/27/2017   LDLDIRECT 171.0 10/10/2016   LDLCALC 78 10/27/2017   ALT 14 10/27/2017   AST 13 10/27/2017   NA 138 10/27/2017   K 4.0 10/27/2017   CL 102 10/27/2017   CREATININE 0.69 10/27/2017   BUN 11 10/27/2017   CO2 30 10/27/2017   TSH 1.02 10/27/2017   HGBA1C 7.1 (A) 04/26/2018   MICROALBUR <0.7 10/27/2017   BP Readings from Last 3 Encounters:  04/26/18 (!) 145/79  12/02/17 122/80  11/20/17 124/76   Wt Readings from Last 3 Encounters:  04/26/18 191 lb (86.6 kg)  12/02/17 192 lb 1.6 oz (87.1 kg)  11/20/17 186 lb (84.4 kg)     ASSESSMENT AND PLAN:  Discussed the following assessment and plan:  Controlled type 2 diabetes mellitus without  complication, without long-term current use of insulin (HCC)  Medication management  HYPERGLYCEMIA, FASTING -  Plan: POC HgB A1c  Prediabetes  Diabetes  a1c  Slight better  On metformin    Consider other meds but patient feels  Monitoring would help and problematic with derm condition -Patient advised to return or notify health care team  if  new concerns arise.  Patient Instructions  a1c is 7.1 a bit better but keep worlong on this  dont know if  Insurance will pay for continuous  Monitor but rx  Written   ROV 6 month cpx and full labs then       [place] K. Gavriella Hearst M.D.

## 2018-04-26 ENCOUNTER — Ambulatory Visit: Payer: 59 | Admitting: Internal Medicine

## 2018-04-26 ENCOUNTER — Encounter: Payer: Self-pay | Admitting: Internal Medicine

## 2018-04-26 ENCOUNTER — Ambulatory Visit (INDEPENDENT_AMBULATORY_CARE_PROVIDER_SITE_OTHER): Payer: 59 | Admitting: Internal Medicine

## 2018-04-26 VITALS — BP 145/79 | HR 80 | Temp 98.3°F | Wt 191.0 lb

## 2018-04-26 DIAGNOSIS — Z79899 Other long term (current) drug therapy: Secondary | ICD-10-CM

## 2018-04-26 DIAGNOSIS — R7309 Other abnormal glucose: Secondary | ICD-10-CM

## 2018-04-26 DIAGNOSIS — E119 Type 2 diabetes mellitus without complications: Secondary | ICD-10-CM | POA: Diagnosis not present

## 2018-04-26 DIAGNOSIS — R7303 Prediabetes: Secondary | ICD-10-CM

## 2018-04-26 LAB — POCT GLYCOSYLATED HEMOGLOBIN (HGB A1C): Hemoglobin A1C: 7.1 % — AB (ref 4.0–5.6)

## 2018-04-26 MED ORDER — CONTINUOUS GLUCOSE MONITOR KIT: PACK | 0 refills | Status: DC

## 2018-04-26 NOTE — Patient Instructions (Addendum)
a1c is 7.1 a bit better but keep worlong on this  dont know if  Insurance will pay for continuous  Monitor but rx  Written   ROV 6 month cpx and full labs then

## 2018-04-28 ENCOUNTER — Other Ambulatory Visit: Payer: Self-pay | Admitting: Internal Medicine

## 2018-05-03 ENCOUNTER — Telehealth: Payer: Self-pay | Admitting: Internal Medicine

## 2018-05-03 MED ORDER — FREESTYLE LIBRE 14 DAY SENSOR MISC: 1.0000 [IU] | 99 refills | Status: DC

## 2018-05-03 NOTE — Telephone Encounter (Signed)
Copied from [place] [id number]. Topic: Quick Communication - See Telephone Encounter >> May 03, 2018  1:33 PM Nils Flack wrote: CRM for notification. See Telephone encounter for: 05/03/18. Pharm called-  They need to have rx for freestyle libre 14 day sensors  [facility] [address] [phone]

## 2018-05-03 NOTE — Telephone Encounter (Signed)
Called pt, states that [facility] could not use the Rx given for glucose supplies - they could only give her the device.  Rx need to be sent again for [facility] 14-day sensors. Pt aware I will send this to the pharmacy. Nothing further needed.

## 2018-05-16 ENCOUNTER — Other Ambulatory Visit: Payer: Self-pay | Admitting: Internal Medicine

## 2018-05-19 ENCOUNTER — Other Ambulatory Visit: Payer: Self-pay | Admitting: Internal Medicine

## 2018-06-07 ENCOUNTER — Other Ambulatory Visit: Payer: Self-pay | Admitting: Internal Medicine

## 2018-06-25 ENCOUNTER — Other Ambulatory Visit: Payer: Self-pay | Admitting: Internal Medicine

## 2018-06-25 NOTE — Telephone Encounter (Signed)
Last filled 03/30/18 #180 x 0rf Upcoming appts 10/18/2018 CPE Please advise Dr Regis Bill, thanks.

## 2018-07-25 ENCOUNTER — Other Ambulatory Visit: Payer: Self-pay | Admitting: Internal Medicine

## 2018-07-30 ENCOUNTER — Other Ambulatory Visit: Payer: Self-pay | Admitting: Internal Medicine

## 2018-08-03 NOTE — Telephone Encounter (Signed)
Pt checking status on her Metformin refill. Please advise.

## 2018-09-19 ENCOUNTER — Other Ambulatory Visit: Payer: Self-pay | Admitting: Internal Medicine

## 2018-09-21 NOTE — Telephone Encounter (Addendum)
Last refilled DULoxetine (CYMBALTA) 60 MG capsule TAKE 2 CAPSULES AT BEDTIME, Normal  Dispense: 180 capsule  Refills: 0 ordered  [id number] [place] [place]. [facility] [address] (Ph: [phone])  Order Details Ordered on: 06/25/18  Authorizing provider: Burnis Medin, MD   Please advise Dr Regis Bill, thanks.

## 2018-10-15 NOTE — Progress Notes (Addendum)
Chief Complaint  Patient presents with  . Annual Exam    Pt states she is concern about her sugar because of the holidays    HPI: Holly Beasley 68 y.o. comes in today for Preventive Medicare exam/ wellness visit .Since last visit.  BPwent up to 2 bid  And doing well with   Medication no se   DM not testing   SVThx  not  HLD taking med  No se noted   Sinus and allergy   Dust cats etc  Controlled on meds   .   Is now on lyrica  Also  Building up .  For back pain  And radiation  Mood stable about the same    Foot dermatitis some better sees dr Verneita Griffes  Using creams  Dx eczema  Not psoriaisi   Health Maintenance  Topic Date Due  . TETANUS/TDAP  04/03/2018  . MAMMOGRAM  07/30/2018  . FOOT EXAM  10/27/2018  . HEMOGLOBIN A1C  10/27/2018  . COLONOSCOPY  02/08/2019  . OPHTHALMOLOGY EXAM  06/14/2019  . INFLUENZA VACCINE  Completed  . DEXA SCAN  Completed  . Hepatitis C Screening  Completed  . PNA vac Low Risk Adult  Completed   Health Maintenance Review LIFESTYLE:  Exercise:  Trainer 3 x per week.   Spinal stnosis  And spondelesthesis  And knees.  Dr Patrice Paradise  Tobacco/ETS: no Alcohol:  no Sugar beverages: diet sodas  Sleep: about  6-7 and naps in day .  Drug use: no HH:  2  3 cats   hus  74 ? Some memory issues     Hearing: ok    Vision:  No limitations at present . Last eye check UTDcataract surgery march April  And no  Assistance needed.   Safety:  Has smoke detector and wears seat belts.  No firearms. No excess sun exposure. Sees dentist regularly.  Memory: Felt to be good  , no concern from her or her family.  Depression: No anhedonia unusual crying or depressive symptoms  Nutrition: Eats well balanced diet; adequate calcium and vitamin D. No swallowing chewing problems.  Injury: no major injuries in the last six months.  Other healthcare providers:  Reviewed today .  Preventive parameters:   Reviewed   ADLS:   There are no problems or need for  assistance  driving, feeding, obtaining food, dressing, toileting and bathing, managing money using phone. She is independent.    ROS:  GEN/ HEENT: No fever, significant weight changes sweats headaches vision problems hearing changes, CV/ PULM; No chest pain shortness of breath cough, syncope,edema  change in exercise tolerance. GI /GU: No adominal pain, vomiting, change in bowel habits. No blood in the stool. No significant GU symptoms. SKIN/HEME: ,no acute skin rashes suspicious lesions or bleeding. No lymphadenopathy, nodules, masses.  NEURO/ PSYCH:  No neurologic signs such as weakness numbness. No depression anxiety. IMM/ Allergy: No unusual infections.  Allergy .   REST of 12 system review negative except as per HPI   Past Medical History:  Diagnosis Date  . Depression   . Diabetes mellitus without complication ([place])   . GERD (gastroesophageal reflux disease)   . History of ETOH abuse    recovering  in remission  . Hyperlipidemia   . Hypertension   . Osteoarthritis   . Osteopenia    dexa -1.7 h -1.35   . Scoliosis    leg length discrepancy  . Skin cancer    basal cell  nose  . Sleep apnea    did not meet criteria for cpap-uses a mouthpiece  . SVT (supraventricular tachycardia) ([place]) 08/15/2016  . Varicose veins   . Wears contact lenses     Family History  Problem Relation Age of Onset  . Lymphoma Mother   . Diabetes Mother   . Hyperlipidemia Mother   . Heart failure Father   . Hyperlipidemia Father   . Arthritis Brother   . Alcohol abuse Brother   . Alcohol abuse Brother     Social History   Socioeconomic History  . Marital status: Married    Spouse name: Not on file  . Number of children: Not on file  . Years of education: Not on file  . Highest education level: Not on file  Occupational History  . Not on file  Social Needs  . Financial resource strain: Not on file  . Food insecurity:    Worry: Not on file    Inability: Not on file  .  Transportation needs:    Medical: Not on file    Non-medical: Not on file  Tobacco Use  . Smoking status: Never Smoker  . Smokeless tobacco: Never Used  Substance and Sexual Activity  . Alcohol use: No    Alcohol/week: 0.0 standard drinks    Comment: recovering-1983  . Drug use: No  . Sexual activity: Not on file  Lifestyle  . Physical activity:    Days per week: Not on file    Minutes per session: Not on file  . Stress: Not on file  Relationships  . Social connections:    Talks on phone: Not on file    Gets together: Not on file    Attends religious service: Not on file    Active member of club or organization: Not on file    Attends meetings of clubs or organizations: Not on file    Relationship status: Not on file  Other Topics Concern  . 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[employer]   Married   ETOH recovering   [place] 2    Has family in [place]          Outpatient Encounter Medications as of 10/18/2018  Medication Sig  . atorvastatin (LIPITOR) 40 MG tablet TAKE 1 TABLET (40 MG TOTAL) BY MOUTH DAILY.  . calcipotriene-betamethasone (TACLONEX) ointment as needed.  . carvedilol (COREG) 12.5 MG tablet Take 1.5 tablets (18.75 mg total) by mouth 2 (two) times daily.  . Cyanocobalamin (VITAMIN B-12 PO) Take 5,000 mcg by mouth daily.  . cycloSPORINE (RESTASIS) 0.05 % ophthalmic emulsion Place 1 drop into both eyes at bedtime.  . DULoxetine (CYMBALTA) 60 MG capsule TAKE 2 CAPSULES AT BEDTIME  . losartan (COZAAR) 100 MG tablet TAKE 1 TABLET BY MOUTH EVERY DAY  . metFORMIN (GLUCOPHAGE-XR) 500 MG 24 hr tablet TAKE 3 TABLETS EVERY DAY  . montelukast (SINGULAIR) 10 MG tablet TAKE 1 TABLET (10 MG TOTAL) BY MOUTH AT BEDTIME.  . MULTIPLE VITAMIN PO Take 1 tablet by mouth daily.  . Omega-3 Fatty Acids (FISH OIL) 1000 MG CAPS Take 1 capsule by mouth daily.  Marland Kitchen  omeprazole (PRILOSEC) 40 MG capsule TAKE 1 CAPSULE BY MOUTH EVERY DAY  . OMNARIS 50 MCG/ACT nasal spray PLACE 2 SPRAYS INTO BOTH NOSTRILS DAILY.  . [DISCONTINUED] metFORMIN (GLUCOPHAGE-XR) 500  MG 24 hr tablet TAKE 3 TABLETS EVERY DAY  . [DISCONTINUED] Continuous Blood Gluc Sensor (FREESTYLE LIBRE 14 DAY SENSOR) MISC 1 Units by Does not apply route every 14 (fourteen) days.  . [DISCONTINUED] Continuous Glucose Monitor KIT Use for monitoring blood sugar fasting and after eating  . [DISCONTINUED] metFORMIN (GLUCOPHAGE-XR) 500 MG 24 hr tablet TAKE 4 TABLETS EVERY DAY   No facility-administered encounter medications on file as of 10/18/2018.     EXAM:  BP 126/62 (BP Location: Right Arm, Patient Position: Sitting, Cuff Size: Normal)   Pulse 77   Temp 98.2 F (36.8 C) (Oral)   Ht '5\' 8"'  (1.727 m)   Wt 192 lb 3.2 oz (87.2 kg)   BMI 29.22 kg/m   Body mass index is 29.22 kg/m.  Physical Exam: Vital signs reviewed OZD:GUYQ is a well-developed well-nourished alert cooperative   who appears stated age in no acute distress.  HEENT: normocephalic atraumatic , Eyes: PERRL EOM's full, conjunctiva clear, Nares: paten,t no deformity discharge or tenderness., Ears: no deformity EAC's clear TMs with normal landmarks. Mouth: clear OP, no lesions, edema.  Moist mucous membranes. Dentition in adequate repair. NECK: supple without masses, thyromegaly or bruits. CHEST/PULM:  Clear to auscultation and percussion breath sounds equal no wheeze , rales or rhonchi. No chest wall deformities or tenderness.Breast: normal by inspection . No dimpling, discharge, masses, tenderness or discharge . CV: PMI is nondisplaced, S1 S2 no gallops, murmurs, rubs. Peripheral pulses are full without delay.No JVD .  ABDOMEN: Bowel sounds normal nontender  No guard or rebound, no hepato splenomegal no CVA tenderness.   Extremtities:  No clubbing cyanosis or edema, no acute joint swelling or redness no focal atrophy NEURO:  Oriented x3,  cranial nerves 3-12 appear to be intact, no obvious focal weakness,gait within normal limits no abnormal reflexes or asymmetrical SKIN: No acute rashes normal turgor, colorfoot dermatitis  Somewhat better  PSYCH: Oriented, good eye contact, no obvious depression anxiety, cognition and judgment appear normal. LN: no cervical axillary inguinal adenopathy No noted deficits in memory, attention, and speech. Diabetic Foot Exam - Simple   Simple Foot Form Diabetic Foot exam was performed with the following findings:  Yes 10/18/2018  9:38 AM  Visual Inspection No deformities, no ulcerations, no other skin breakdown bilaterally:  Yes See comments:  Yes Sensation Testing Intact to touch and monofilament testing bilaterally:  Yes Pulse Check Posterior Tibialis and Dorsalis pulse intact bilaterally:  Yes Comments Thickened naisl and foot dermatitis  No ulcer       Lab Results  Component Value Date   WBC 7.6 10/18/2018   HGB 12.0 10/18/2018   HCT 35.6 (L) 10/18/2018   PLT 296.0 10/18/2018   GLUCOSE 192 (H) 10/18/2018   CHOL 157 10/18/2018   TRIG 130.0 10/18/2018   HDL 45.20 10/18/2018   LDLDIRECT 171.0 10/10/2016   LDLCALC 86 10/18/2018   ALT 15 10/18/2018   AST 14 10/18/2018   NA 139 10/18/2018   K 4.5 10/18/2018   CL 103 10/18/2018   CREATININE 0.73 10/18/2018   BUN 12 10/18/2018   CO2 28 10/18/2018   TSH 1.49 10/18/2018   HGBA1C 7.4 (H) 10/18/2018   MICROALBUR <0.7 10/18/2018    ASSESSMENT AND PLAN:  Discussed the following assessment and plan:  Visit for preventive health examination  Medication management - Plan: Basic metabolic panel, CBC with Differential/Platelet, Hemoglobin A1c, Hepatic function panel, Lipid panel, TSH, Microalbumin / creatinine urine ratio  Controlled type 2 diabetes mellitus without  complication, without long-term current use of insulin ([place]) - Plan: Basic metabolic panel, CBC with Differential/Platelet, Hemoglobin A1c, Hepatic function panel, Lipid  panel, TSH, Microalbumin / creatinine urine ratio  Essential hypertension - Plan: Basic metabolic panel, CBC with Differential/Platelet, Hemoglobin A1c, Hepatic function panel, Lipid panel, TSH, Microalbumin / creatinine urine ratio  Hyperlipidemia, unspecified hyperlipidemia type - Plan: Basic metabolic panel, CBC with Differential/Platelet, Hemoglobin A1c, Hepatic function panel, Lipid panel, TSH, Microalbumin / creatinine urine ratio  Foot dermatitis Ok to stay on  Higher dose of carvedilol if no brady or hypotension   Last  dexa 2018 -1.5   patient is now on lyrica  ? Dose  Not on pat list Patient Care Team: Glema Takaki, Standley Brooking, MD as PCP - General Camillo [place], [place] as Referring Physician (Optometry) Neldon Mc, Donnamarie Poag, MD as Attending Physician ([place]) Rolm Bookbinder, MD as Consulting Physician (Dermatology) Norma Fredrickson, MD as Consulting Physician (Psychiatry) Mal Misty, MD (Inactive) as Consulting Physician (Vascular Surgery) Thompson Grayer, MD as Consulting Physician (Cardiology)  Patient Instructions  Glad you are doing well blood pressure is  Good.  Will notify you  of labs when available.   Ask to see if shingrix is covered in the office vx pharmacy and can get  Injection appt if so .  Will check on ned for   Fu bone denisty     Preventive Care 65 Years and Older, Female Preventive care refers to lifestyle choices and visits with your health care provider that can promote health and wellness. What does preventive care include?  A yearly physical exam. This is also called an annual well check.  Dental exams once or twice a year.  Routine eye exams. Ask your health care provider how often you should have your eyes checked.  Personal lifestyle choices, including: ? Daily care of your teeth and gums. ? Regular physical activity. ? Eating a healthy diet. ? Avoiding tobacco and drug use. ? Limiting alcohol use. ? Practicing safe sex. ? Taking low-dose  aspirin every day. ? Taking vitamin and mineral supplements as recommended by your health care provider. What happens during an annual well check? The services and screenings done by your health care provider during your annual well check will depend on your age, overall health, lifestyle risk factors, and family history of disease. Counseling Your health care provider may ask you questions about your:  Alcohol use.  Tobacco use.  Drug use.  Emotional well-being.  Home and relationship well-being.  Sexual activity.  Eating habits.  History of falls.  Memory and ability to understand (cognition).  Work and work Statistician.  Reproductive health.  Screening You may have the following tests or measurements:  Height, weight, and BMI.  Blood pressure.  Lipid and cholesterol levels. These may be checked every 5 years, or more frequently if you are over 13 years old.  Skin check.  Lung cancer screening. You may have this screening every year starting at age 55 if you have a 30-pack-year history of smoking and currently smoke or have quit within the past 15 years.  Colorectal cancer screening. All adults should have this screening starting at age 35 and continuing until age 61. You will have tests every 1-10 years, depending on your results and the type of screening test. People at increased risk should start screening at an earlier age. Screening tests may include: ? Guaiac-based fecal occult blood testing. ? Fecal immunochemical test (FIT). ? Stool DNA test. ? Virtual colonoscopy. ?  Sigmoidoscopy. During this test, a flexible tube with a tiny camera (sigmoidoscope) is used to examine your rectum and lower colon. The sigmoidoscope is inserted through your anus into your rectum and lower colon. ? Colonoscopy. During this test, a long, thin, flexible tube with a tiny camera (colonoscope) is used to examine your entire colon and rectum.  Hepatitis C blood test.  Hepatitis B  blood test.  Sexually transmitted disease (STD) testing.  Diabetes screening. This is done by checking your blood sugar (glucose) after you have not eaten for a while (fasting). You may have this done every 1-3 years.  Bone density scan. This is done to screen for osteoporosis. You may have this done starting at age 2.  Mammogram. This may be done every 1-2 years. Talk to your health care provider about how often you should have regular mammograms. Talk with your health care provider about your test results, treatment options, and if necessary, the need for more tests. Vaccines Your health care provider may recommend certain vaccines, such as:  Influenza vaccine. This is recommended every year.  Tetanus, diphtheria, and acellular pertussis (Tdap, Td) vaccine. You may need a Td booster every 10 years.  Varicella vaccine. You may need this if you have not been vaccinated.  Zoster vaccine. You may need this after age 3.  Measles, mumps, and rubella (MMR) vaccine. You may need at least one dose of MMR if you were born in 1957 or later. You may also need a second dose.  Pneumococcal 13-valent conjugate (PCV13) vaccine. One dose is recommended after age 59.  Pneumococcal polysaccharide (PPSV23) vaccine. One dose is recommended after age 39.  Meningococcal vaccine. You may need this if you have certain conditions.  Hepatitis A vaccine. You may need this if you have certain conditions or if you travel or work in places where you may be exposed to hepatitis A.  Hepatitis B vaccine. You may need this if you have certain conditions or if you travel or work in places where you may be exposed to hepatitis B.  Haemophilus influenzae type b (Hib) vaccine. You may need this if you have certain conditions. Talk to your health care provider about which screenings and vaccines you need and how often you need them. This information is not intended to replace advice given to you by your health care  provider. Make sure you discuss any questions you have with your health care provider. Document Released: 10/26/2015 Document Revised: 11/19/2017 Document Reviewed: 07/31/2015 Elsevier Interactive Patient Education  2019 [place] [employer]. Derricka Mertz M.D.

## 2018-10-18 ENCOUNTER — Encounter: Payer: Self-pay | Admitting: Internal Medicine

## 2018-10-18 ENCOUNTER — Ambulatory Visit (INDEPENDENT_AMBULATORY_CARE_PROVIDER_SITE_OTHER): Payer: 59 | Admitting: Internal Medicine

## 2018-10-18 VITALS — BP 126/62 | HR 77 | Temp 98.2°F | Ht 68.0 in | Wt 192.2 lb

## 2018-10-18 DIAGNOSIS — I10 Essential (primary) hypertension: Secondary | ICD-10-CM | POA: Diagnosis not present

## 2018-10-18 DIAGNOSIS — E785 Hyperlipidemia, unspecified: Secondary | ICD-10-CM

## 2018-10-18 DIAGNOSIS — E119 Type 2 diabetes mellitus without complications: Secondary | ICD-10-CM | POA: Diagnosis not present

## 2018-10-18 DIAGNOSIS — Z0000 Encounter for general adult medical examination without abnormal findings: Secondary | ICD-10-CM | POA: Diagnosis not present

## 2018-10-18 DIAGNOSIS — Z79899 Other long term (current) drug therapy: Secondary | ICD-10-CM | POA: Diagnosis not present

## 2018-10-18 DIAGNOSIS — L309 Dermatitis, unspecified: Secondary | ICD-10-CM

## 2018-10-18 LAB — LIPID PANEL
Cholesterol: 157 mg/dL (ref 0–200)
HDL: 45.2 mg/dL (ref >39.00)
LDL Cholesterol: 86 mg/dL (ref 0–99)
NonHDL: 111.51
Total CHOL/HDL Ratio: 3
Triglycerides: 130 mg/dL (ref 0.0–149.0)
VLDL: 26 mg/dL (ref 0.0–40.0)

## 2018-10-18 LAB — HEPATIC FUNCTION PANEL
ALT: 15 U/L (ref 0–35)
AST: 14 U/L (ref 0–37)
Albumin: 4 g/dL (ref 3.5–5.2)
Alkaline Phosphatase: 64 U/L (ref 39–117)
Bilirubin, Direct: 0.1 mg/dL (ref 0.0–0.3)
Total Bilirubin: 0.4 mg/dL (ref 0.2–1.2)
Total Protein: 6.4 g/dL (ref 6.0–8.3)

## 2018-10-18 LAB — BASIC METABOLIC PANEL
BUN: 12 mg/dL (ref 6–23)
CALCIUM: 9.6 mg/dL (ref 8.4–10.5)
CO2: 28 meq/L (ref 19–32)
CREATININE: 0.73 mg/dL (ref 0.40–1.20)
Chloride: 103 mEq/L (ref 96–112)
GFR: 84.36 mL/min (ref >60.00)
Glucose, Bld: 192 mg/dL — ABNORMAL HIGH (ref 70–99)
Potassium: 4.5 mEq/L (ref 3.5–5.1)
SODIUM: 139 meq/L (ref 135–145)

## 2018-10-18 LAB — CBC WITH DIFFERENTIAL/PLATELET
BASOS ABS: 0.1 10*3/uL (ref 0.0–0.1)
Basophils Relative: 0.8 % (ref 0.0–3.0)
Eosinophils Absolute: 0.4 10*3/uL (ref 0.0–0.7)
Eosinophils Relative: 5.9 % — ABNORMAL HIGH (ref 0.0–5.0)
HCT: 35.6 % — ABNORMAL LOW (ref 36.0–46.0)
Hemoglobin: 12 g/dL (ref 12.0–15.0)
Lymphocytes Relative: 30.7 % (ref 12.0–46.0)
Lymphs Abs: 2.3 10*3/uL (ref 0.7–4.0)
MCHC: 33.6 g/dL (ref 30.0–36.0)
MCV: 92.7 fl (ref 78.0–100.0)
Monocytes Absolute: 0.7 10*3/uL (ref 0.1–1.0)
Monocytes Relative: 8.6 % (ref 3.0–12.0)
Neutro Abs: 4.1 10*3/uL (ref 1.4–7.7)
Neutrophils Relative %: 54 % (ref 43.0–77.0)
Platelets: 296 10*3/uL (ref 150.0–400.0)
RBC: 3.83 Mil/uL — AB (ref 3.87–5.11)
RDW: 13.5 % (ref 11.5–15.5)
WBC: 7.6 10*3/uL (ref 4.0–10.5)

## 2018-10-18 LAB — MICROALBUMIN / CREATININE URINE RATIO
Creatinine,U: 57.2 mg/dL
Microalb Creat Ratio: 1.2 mg/g (ref 0.0–30.0)
Microalb, Ur: <0.7 mg/dL (ref 0.0–1.9)

## 2018-10-18 LAB — TSH: TSH: 1.49 u[IU]/mL (ref 0.35–4.50)

## 2018-10-18 LAB — HEMOGLOBIN A1C: HEMOGLOBIN A1C: 7.4 % — AB (ref 4.6–6.5)

## 2018-10-18 NOTE — Patient Instructions (Addendum)
Glad you are doing well blood pressure is  Good.  Will notify you  of labs when available.   Ask to see if shingrix is covered in the office vx pharmacy and can get  Injection appt if so .  Will check on ned for   Fu bone denisty     Preventive Care 68 Years and Older, Female Preventive care refers to lifestyle choices and visits with your health care provider that can promote health and wellness. What does preventive care include?  A yearly physical exam. This is also called an annual well check.  Dental exams once or twice a year.  Routine eye exams. Ask your health care provider how often you should have your eyes checked.  Personal lifestyle choices, including: ? Daily care of your teeth and gums. ? Regular physical activity. ? Eating a healthy diet. ? Avoiding tobacco and drug use. ? Limiting alcohol use. ? Practicing safe sex. ? Taking low-dose aspirin every day. ? Taking vitamin and mineral supplements as recommended by your health care provider. What happens during an annual well check? The services and screenings done by your health care provider during your annual well check will depend on your age, overall health, lifestyle risk factors, and family history of disease. Counseling Your health care provider may ask you questions about your:  Alcohol use.  Tobacco use.  Drug use.  Emotional well-being.  Home and relationship well-being.  Sexual activity.  Eating habits.  History of falls.  Memory and ability to understand (cognition).  Work and work Statistician.  Reproductive health.  Screening You may have the following tests or measurements:  Height, weight, and BMI.  Blood pressure.  Lipid and cholesterol levels. These may be checked every 5 years, or more frequently if you are over 67 years old.  Skin check.  Lung cancer screening. You may have this screening every year starting at age 29 if you have a 30-pack-year history of smoking and  currently smoke or have quit within the past 15 years.  Colorectal cancer screening. All adults should have this screening starting at age 72 and continuing until age 88. You will have tests every 1-10 years, depending on your results and the type of screening test. People at increased risk should start screening at an earlier age. Screening tests may include: ? Guaiac-based fecal occult blood testing. ? Fecal immunochemical test (FIT). ? Stool DNA test. ? Virtual colonoscopy. ? Sigmoidoscopy. During this test, a flexible tube with a tiny camera (sigmoidoscope) is used to examine your rectum and lower colon. The sigmoidoscope is inserted through your anus into your rectum and lower colon. ? Colonoscopy. During this test, a long, thin, flexible tube with a tiny camera (colonoscope) is used to examine your entire colon and rectum.  Hepatitis C blood test.  Hepatitis B blood test.  Sexually transmitted disease (STD) testing.  Diabetes screening. This is done by checking your blood sugar (glucose) after you have not eaten for a while (fasting). You may have this done every 1-3 years.  Bone density scan. This is done to screen for osteoporosis. You may have this done starting at age 48.  Mammogram. This may be done every 1-2 years. Talk to your health care provider about how often you should have regular mammograms. Talk with your health care provider about your test results, treatment options, and if necessary, the need for more tests. Vaccines Your health care provider may recommend certain vaccines, such as:  Influenza vaccine. This  is recommended every year.  Tetanus, diphtheria, and acellular pertussis (Tdap, Td) vaccine. You may need a Td booster every 10 years.  Varicella vaccine. You may need this if you have not been vaccinated.  Zoster vaccine. You may need this after age 40.  Measles, mumps, and rubella (MMR) vaccine. You may need at least one dose of MMR if you were born in  1957 or later. You may also need a second dose.  Pneumococcal 13-valent conjugate (PCV13) vaccine. One dose is recommended after age 68.  Pneumococcal polysaccharide (PPSV23) vaccine. One dose is recommended after age 19.  Meningococcal vaccine. You may need this if you have certain conditions.  Hepatitis A vaccine. You may need this if you have certain conditions or if you travel or work in places where you may be exposed to hepatitis A.  Hepatitis B vaccine. You may need this if you have certain conditions or if you travel or work in places where you may be exposed to hepatitis B.  Haemophilus influenzae type b (Hib) vaccine. You may need this if you have certain conditions. Talk to your health care provider about which screenings and vaccines you need and how often you need them. This information is not intended to replace advice given to you by your health care provider. Make sure you discuss any questions you have with your health care provider. Document Released: 10/26/2015 Document Revised: 11/19/2017 Document Reviewed: 07/31/2015 Elsevier Interactive Patient Education  2019 [employer].

## 2018-10-19 ENCOUNTER — Other Ambulatory Visit: Payer: Self-pay | Admitting: Internal Medicine

## 2018-10-20 NOTE — Telephone Encounter (Signed)
Okay to refill as is? Last A1c was a little elevated 10/18/2018  Results for orders placed or performed in visit on 10/18/18 (from the past 72 hour(s))  Basic metabolic panel     Status: Abnormal   Collection Time: 10/18/18  9:45 AM  Result Value Ref Range   Sodium 139 135 - 145 mEq/L   Potassium 4.5 3.5 - 5.1 mEq/L   Chloride 103 96 - 112 mEq/L   CO2 28 19 - 32 mEq/L   Glucose, Bld 192 (H) 70 - 99 mg/dL   BUN 12 6 - 23 mg/dL   Creatinine, Ser 0.73 0.40 - 1.20 mg/dL   Calcium 9.6 8.4 - 10.5 mg/dL   GFR 84.36 >60.00 mL/min  CBC with Differential/Platelet     Status: Abnormal   Collection Time: 10/18/18  9:45 AM  Result Value Ref Range   WBC 7.6 4.0 - 10.5 K/uL   RBC 3.83 (L) 3.87 - 5.11 Mil/uL   Hemoglobin 12.0 12.0 - 15.0 g/dL   HCT 35.6 (L) 36.0 - 46.0 %   MCV 92.7 78.0 - 100.0 fl   MCHC 33.6 30.0 - 36.0 g/dL   RDW 13.5 11.5 - 15.5 %   Platelets 296.0 150.0 - 400.0 K/uL   Neutrophils Relative % 54.0 43.0 - 77.0 %   Lymphocytes Relative 30.7 12.0 - 46.0 %   Monocytes Relative 8.6 3.0 - 12.0 %   Eosinophils Relative 5.9 (H) 0.0 - 5.0 %   Basophils Relative 0.8 0.0 - 3.0 %   Neutro Abs 4.1 1.4 - 7.7 K/uL   Lymphs Abs 2.3 0.7 - 4.0 K/uL   Monocytes Absolute 0.7 0.1 - 1.0 K/uL   Eosinophils Absolute 0.4 0.0 - 0.7 K/uL   Basophils Absolute 0.1 0.0 - 0.1 K/uL  Hemoglobin A1c     Status: Abnormal   Collection Time: 10/18/18  9:45 AM  Result Value Ref Range   Hgb A1c MFr Bld 7.4 (H) 4.6 - 6.5 %    Comment: Glycemic Control Guidelines for People with Diabetes:Non Diabetic:  <6%Goal of Therapy: <7%Additional Action Suggested:  >8%   Hepatic function panel     Status: None   Collection Time: 10/18/18  9:45 AM  Result Value Ref Range   Total Bilirubin 0.4 0.2 - 1.2 mg/dL   Bilirubin, Direct 0.1 0.0 - 0.3 mg/dL   Alkaline Phosphatase 64 39 - 117 U/L   AST 14 0 - 37 U/L   ALT 15 0 - 35 U/L   Total Protein 6.4 6.0 - 8.3 g/dL   Albumin 4.0 3.5 - 5.2 g/dL  Lipid panel     Status:  None   Collection Time: 10/18/18  9:45 AM  Result Value Ref Range   Cholesterol 157 0 - 200 mg/dL    Comment: ATP III Classification       Desirable:  < 200 mg/dL               Borderline High:  200 - 239 mg/dL          High:  > = 240 mg/dL   Triglycerides 130.0 0.0 - 149.0 mg/dL    Comment: Normal:  <150 mg/dLBorderline High:  150 - 199 mg/dL   HDL 45.20 >39.00 mg/dL   VLDL 26.0 0.0 - 40.0 mg/dL   LDL Cholesterol 86 0 - 99 mg/dL   Total CHOL/HDL Ratio 3     Comment:  Men          Women1/2 Average Risk     3.4          3.3Average Risk          5.0          4.42X Average Risk          9.6          7.13X Average Risk          15.0          11.0                       NonHDL 111.51     Comment: NOTE:  Non-HDL goal should be 30 mg/dL higher than patient's LDL goal (i.e. LDL goal of < 70 mg/dL, would have non-HDL goal of < 100 mg/dL)  TSH     Status: None   Collection Time: 10/18/18  9:45 AM  Result Value Ref Range   TSH 1.49 0.35 - 4.50 uIU/mL  Microalbumin / creatinine urine ratio     Status: None   Collection Time: 10/18/18  9:45 AM  Result Value Ref Range   Microalb, Ur <0.7 0.0 - 1.9 mg/dL   Creatinine,U 57.2 mg/dL   Microalb Creat Ratio 1.2 0.0 - 30.0 mg/g

## 2018-10-22 NOTE — Progress Notes (Signed)
Pt has been notified and is already taking metformin QID.

## 2018-10-23 ENCOUNTER — Other Ambulatory Visit: Payer: Self-pay | Admitting: Internal Medicine

## 2018-10-25 NOTE — Telephone Encounter (Signed)
See previous that result note that I had talked to PT. Pt is already taking 4 a day.

## 2018-10-25 NOTE — Telephone Encounter (Signed)
Please see my result note about increaseing  Metformin dose and then send in 6 mos refill for that amount if she agress

## 2018-11-10 ENCOUNTER — Other Ambulatory Visit: Payer: Self-pay | Admitting: Internal Medicine

## 2018-11-25 ENCOUNTER — Other Ambulatory Visit: Payer: Self-pay | Admitting: Internal Medicine

## 2018-12-16 ENCOUNTER — Other Ambulatory Visit: Payer: Self-pay | Admitting: Internal Medicine

## 2019-02-01 ENCOUNTER — Other Ambulatory Visit: Payer: Self-pay | Admitting: Internal Medicine

## 2019-02-13 ENCOUNTER — Other Ambulatory Visit: Payer: Self-pay | Admitting: Internal Medicine

## 2019-03-11 ENCOUNTER — Other Ambulatory Visit: Payer: Self-pay | Admitting: Internal Medicine

## 2019-03-14 ENCOUNTER — Other Ambulatory Visit: Payer: Self-pay | Admitting: Internal Medicine

## 2019-04-15 NOTE — Progress Notes (Signed)
Chief Complaint  Patient presents with  . Follow-up    pt is here for follow up on diabetes and states she is due for colonoscopy    HPI: Holly Beasley 68 y.o. come in for Chronic disease management   DM  Has gained 4 #    covid changes    Taking  4 metformin per day no sig se   Bp ok  On meds   Back  Sees dr Patrice Paradise  Spondylosis and  stenoiss  Could do more  Walking   No neuro vision changes of imprort  No using sleep apnea apparatus   Mood   Same meds not seeing psych not unless needed and we are doing  Her meds  A little off causeof the covid   Restriction pandemic but etting out and frocery shopping  Mask etc.   Recurrent sinus allergy stable at this time on meds  singular and ocass zyrtec  ROS: See pertinent positives and negatives per HPI. No cp sob left leg swellsa t times  Vein vascular eval no acute disease off and on   Past Medical History:  Diagnosis Date  . Depression   . Diabetes mellitus without complication ([place])   . GERD (gastroesophageal reflux disease)   . History of ETOH abuse    recovering  in remission  . Hyperlipidemia   . Hypertension   . Osteoarthritis   . Osteopenia    dexa -1.7 h -1.35   . Scoliosis    leg length discrepancy  . Skin cancer    basal cell nose  . Sleep apnea    did not meet criteria for cpap-uses a mouthpiece  . SVT (supraventricular tachycardia) ([place]) 08/15/2016  . Varicose veins   . Wears contact lenses     Family History  Problem Relation Age of Onset  . Lymphoma Mother   . Diabetes Mother   . Hyperlipidemia Mother   . Heart failure Father   . Hyperlipidemia Father   . Arthritis Brother   . Alcohol abuse Brother   . Alcohol abuse Brother     Social History   Socioeconomic History  . Marital status: Married    Spouse name: Not on file  . Number of children: Not on file  . Years of education: Not on file  . Highest education level: Not on file  Occupational History  . Not on file  Social Needs  .  Financial resource strain: Not on file  . Food insecurity    Worry: Not on file    Inability: Not on file  . Transportation needs    Medical: Not on file    Non-medical: Not on file  Tobacco Use  . Smoking status: Never Smoker  . Smokeless tobacco: Never Used  Substance and Sexual Activity  . Alcohol use: No    Alcohol/week: 0.0 standard drinks    Comment: recovering-1983  . Drug use: No  . Sexual activity: Not on file  Lifestyle  . Physical activity    Days per week: Not on file    Minutes per session: Not on file  . Stress: Not on file  Relationships  . Social Herbalist on phone: Not on file    Gets together: Not on file    Attends religious service: Not on file    Active member of club or organization: Not on file    Attends meetings of clubs or organizations: Not on file    Relationship  status: Not on file  Other Topics Concern  . 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[employer]   Married   ETOH recovering   [place] 2    Has family in [place]          Outpatient Medications Prior to Visit  Medication Sig Dispense Refill  . atorvastatin (LIPITOR) 40 MG tablet TAKE 1 TABLET (40 MG TOTAL) BY MOUTH DAILY. 90 tablet 1  . calcipotriene-betamethasone (TACLONEX) ointment as needed.  2  . carvedilol (COREG) 12.5 MG tablet TAKE 1 & 1/2 TABLET TWICE A DAY 270 tablet 3  . Cyanocobalamin (VITAMIN B-12 PO) Take 5,000 mcg by mouth daily.    . cycloSPORINE (RESTASIS) 0.05 % ophthalmic emulsion Place 1 drop into both eyes at bedtime.    . DULoxetine (CYMBALTA) 60 MG capsule TAKE 2 CAPSULES BY MOUTH EVERY DAY AT BEDTIME 180 capsule 0  . losartan (COZAAR) 100 MG tablet TAKE 1 TABLET BY MOUTH EVERY DAY 90 tablet 0  . metFORMIN (GLUCOPHAGE-XR) 500 MG 24 hr tablet TAKE 3 TABLETS BY MOUTH EVERY DAY 270 tablet 0  . montelukast (SINGULAIR) 10 MG tablet TAKE 1  TABLET (10 MG TOTAL) BY MOUTH AT BEDTIME. 90 tablet 1  . MULTIPLE VITAMIN PO Take 1 tablet by mouth daily.    . Omega-3 Fatty Acids (FISH OIL) 1000 MG CAPS Take 1 capsule by mouth daily.    Marland Kitchen omeprazole (PRILOSEC) 40 MG capsule TAKE 1 CAPSULE BY MOUTH EVERY DAY 90 capsule 0  . OMNARIS 50 MCG/ACT nasal spray PLACE 2 SPRAYS INTO BOTH NOSTRILS DAILY. 12.5 g 5   No facility-administered medications prior to visit.      EXAM:  BP 130/72 (BP Location: Right Arm, Patient Position: Sitting, Cuff Size: Normal)   Pulse 75   Temp 98.2 F (36.8 C) (Oral)   Wt 198 lb (89.8 kg)   SpO2 97%   BMI 30.11 kg/m   Body mass index is 30.11 kg/m.  GENERAL: vitals reviewed and listed above, alert, oriented, appears well hydrated and in no acute distress HEENT: atraumatic, conjunctiva  clear, no obvious abnormalities on inspection of external nose and ears  NECK: no obvious masses on inspection palpation  LUNGS: clear to auscultation bilaterally, no wheezes, rales or rhonchi, good air movement CV: HRRR, no clubbing cyanosis or lle slight edrema more than right  No lesion som skin changes MS: moves all extremities without noticeable focal  abnormality PSYCH: pleasant and cooperative, no obvious depression or anxiety Lab Results  Component Value Date   WBC 7.6 10/18/2018   HGB 12.0 10/18/2018   HCT 35.6 (L) 10/18/2018   PLT 296.0 10/18/2018   GLUCOSE 192 (H) 10/18/2018   CHOL 157 10/18/2018   TRIG 130.0 10/18/2018   HDL 45.20 10/18/2018   LDLDIRECT 171.0 10/10/2016   LDLCALC 86 10/18/2018   ALT 15 10/18/2018   AST 14 10/18/2018   NA 139 10/18/2018   K 4.5 10/18/2018   CL 103 10/18/2018   CREATININE 0.73 10/18/2018   BUN 12 10/18/2018   CO2 28 10/18/2018   TSH 1.49 10/18/2018   HGBA1C 7.3 (A) 04/18/2019   MICROALBUR <0.7 10/18/2018   BP Readings from Last 3 Encounters:  04/18/19 130/72  10/18/18 126/62  04/26/18 (!) 145/79    ASSESSMENT AND PLAN:  Discussed the following assessment  and  plan:   ICD-10-CM   1. Controlled type 2 diabetes mellitus without complication, without long-term current use of insulin (HCC)  E11.9 POCT glycosylated hemoglobin (Hb A1C)  2. Medication management  Z79.899    mood seems stable continue meds   3. Essential hypertension  I10   4. Hyperlipidemia, unspecified hyperlipidemia type  E78.5   5. Venous insufficiency (chronic) (peripheral)  I87.2    Stable diabetes   stragegies to get weight control  conact   dues for mammo and colon 10 years vs 5 yr recall done in 2010 Teams updated  Get colon contact and mammo . -Patient advised to return or notify health care team  if  new concerns arise.  Patient Instructions    Plan cpx and  Lab in about 6 months  Or as needed Call the [facility] at [place] and set us=p for your  Colonoscopy. Refill requests through  [facility].  M.D.

## 2019-04-18 ENCOUNTER — Other Ambulatory Visit: Payer: Self-pay

## 2019-04-18 ENCOUNTER — Ambulatory Visit (INDEPENDENT_AMBULATORY_CARE_PROVIDER_SITE_OTHER): Payer: 59 | Admitting: Internal Medicine

## 2019-04-18 ENCOUNTER — Encounter: Payer: Self-pay | Admitting: Internal Medicine

## 2019-04-18 VITALS — BP 130/72 | HR 75 | Temp 98.2°F | Wt 198.0 lb

## 2019-04-18 DIAGNOSIS — Z79899 Other long term (current) drug therapy: Secondary | ICD-10-CM | POA: Diagnosis not present

## 2019-04-18 DIAGNOSIS — E119 Type 2 diabetes mellitus without complications: Secondary | ICD-10-CM | POA: Diagnosis not present

## 2019-04-18 DIAGNOSIS — E785 Hyperlipidemia, unspecified: Secondary | ICD-10-CM | POA: Diagnosis not present

## 2019-04-18 DIAGNOSIS — I872 Venous insufficiency (chronic) (peripheral): Secondary | ICD-10-CM

## 2019-04-18 DIAGNOSIS — I10 Essential (primary) hypertension: Secondary | ICD-10-CM

## 2019-04-18 LAB — POCT GLYCOSYLATED HEMOGLOBIN (HGB A1C): Hemoglobin A1C: 7.3 % — AB (ref 4.0–5.6)

## 2019-04-18 NOTE — Patient Instructions (Signed)
   Plan cpx and  Lab in about 6 months  Or as needed Call the [facility] at [place] and set us=p for your  Colonoscopy. Refill requests through  Pharmacies

## 2019-04-25 ENCOUNTER — Encounter: Payer: Self-pay | Admitting: Internal Medicine

## 2019-04-29 ENCOUNTER — Other Ambulatory Visit: Payer: Self-pay | Admitting: Internal Medicine

## 2019-05-02 ENCOUNTER — Other Ambulatory Visit: Payer: Self-pay | Admitting: Internal Medicine

## 2019-05-04 ENCOUNTER — Ambulatory Visit: Payer: 59 | Admitting: *Deleted

## 2019-05-04 ENCOUNTER — Other Ambulatory Visit: Payer: Self-pay

## 2019-05-04 VITALS — Ht 69.0 in | Wt 198.0 lb

## 2019-05-04 DIAGNOSIS — Z1211 Encounter for screening for malignant neoplasm of colon: Secondary | ICD-10-CM

## 2019-05-04 NOTE — Progress Notes (Signed)
Patient's pre-visit was done today over the phone with the patient due to COVID-19 pandemic. Name,DOB and address verified. Insurance verified. Packet of Prep instructions mailed to patient including copy of a consent form and pre-procedure patient acknowledgement form-pt is aware. Patient understands to call us back with any questions or concerns. 2 day prep given to pt due to c/o constipation per pt.  Patient denies any allergies to eggs or soy. Patient denies any problems with anesthesia/sedation. Patient denies any oxygen use at home. Patient denies taking any diet/weight loss medications or blood thinners. EMMI education assisgned to patient on colonoscopy, this was explained and instructions given to patient. Pt is aware that care partner will wait in the car during procedure; if they feel like they will be too hot to wait in the car; they may wait in the lobby.  We want them to wear a mask (we do not have any that we can provide them), practice social distancing, and we will check their temperatures when they get here.  I did remind patient that their care partner needs to stay in the parking lot the entire time. Pt will wear mask into building.

## 2019-05-10 ENCOUNTER — Other Ambulatory Visit: Payer: Self-pay | Admitting: Internal Medicine

## 2019-05-17 ENCOUNTER — Telehealth: Payer: Self-pay | Admitting: Internal Medicine

## 2019-05-17 NOTE — Telephone Encounter (Signed)
Pt responded "no" to alls screening questions

## 2019-05-17 NOTE — Telephone Encounter (Signed)

## 2019-05-18 ENCOUNTER — Ambulatory Visit (AMBULATORY_SURGERY_CENTER): Payer: 59 | Admitting: Internal Medicine

## 2019-05-18 ENCOUNTER — Other Ambulatory Visit: Payer: Self-pay

## 2019-05-18 ENCOUNTER — Encounter: Payer: Self-pay | Admitting: Internal Medicine

## 2019-05-18 VITALS — BP 105/75 | HR 62 | Temp 98.0°F | Resp 16 | Ht 68.0 in | Wt 198.0 lb

## 2019-05-18 DIAGNOSIS — D120 Benign neoplasm of cecum: Secondary | ICD-10-CM

## 2019-05-18 DIAGNOSIS — D123 Benign neoplasm of transverse colon: Secondary | ICD-10-CM

## 2019-05-18 DIAGNOSIS — D122 Benign neoplasm of ascending colon: Secondary | ICD-10-CM | POA: Diagnosis not present

## 2019-05-18 DIAGNOSIS — D125 Benign neoplasm of sigmoid colon: Secondary | ICD-10-CM | POA: Diagnosis not present

## 2019-05-18 DIAGNOSIS — D124 Benign neoplasm of descending colon: Secondary | ICD-10-CM | POA: Diagnosis not present

## 2019-05-18 DIAGNOSIS — Z1211 Encounter for screening for malignant neoplasm of colon: Secondary | ICD-10-CM | POA: Diagnosis not present

## 2019-05-18 MED ORDER — SODIUM CHLORIDE 0.9 % IV SOLN: 500.0000 mL | Freq: Once | INTRAVENOUS | Status: DC

## 2019-05-18 NOTE — Patient Instructions (Addendum)
I found and removed 16 tiny to small polyps today. That is a lot but no signs of cancer to my eye. All polyps will be analyzed.  I think I will need to ask you to return in 1 year - will let you know.  I will let you know pathology results and when to have another routine colonoscopy by mail and/or My Chart.  I appreciate the opportunity to care for you. Gatha Mayer, MD, FACG   YOU HAD AN ENDOSCOPIC PROCEDURE TODAY AT [facility]:   Refer to the procedure report that was given to you for any specific questions about what was found during the examination.  If the procedure report does not answer your questions, please call your gastroenterologist to clarify.  If you requested that your care partner not be given the details of your procedure findings, then the procedure report has been included in a sealed envelope for you to review at your convenience later.  YOU SHOULD EXPECT: Some feelings of bloating in the abdomen. Passage of more gas than usual.  Walking can help get rid of the air that was put into your GI tract during the procedure and reduce the bloating. If you had a lower endoscopy (such as a colonoscopy or flexible sigmoidoscopy) you may notice spotting of blood in your stool or on the toilet paper. If you underwent a bowel prep for your procedure, you may not have a normal bowel movement for a few days.  Please Note:  You might notice some irritation and congestion in your nose or some drainage.  This is from the oxygen used during your procedure.  There is no need for concern and it should clear up in a day or so.  SYMPTOMS TO REPORT IMMEDIATELY:   Following lower endoscopy (colonoscopy or flexible sigmoidoscopy):  Excessive amounts of blood in the stool  Significant tenderness or worsening of abdominal pains  Swelling of the abdomen that is new, acute  Fever of 100F or higher    For urgent or emergent issues, a gastroenterologist can be reached  at any hour by calling [phone].   DIET:  We do recommend a small meal at first, but then you may proceed to your regular diet.  Drink plenty of fluids but you should avoid alcoholic beverages for 24 hours.  ACTIVITY:  You should plan to take it easy for the rest of today and you should NOT DRIVE or use heavy machinery until tomorrow (because of the sedation medicines used during the test).    FOLLOW UP: Our staff will call the number listed on your records 48-72 hours following your procedure to check on you and address any questions or concerns that you may have regarding the information given to you following your procedure. If we do not reach you, we will leave a message.  We will attempt to reach you two times.  During this call, we will ask if you have developed any symptoms of COVID 19. If you develop any symptoms (ie: fever, flu-like symptoms, shortness of breath, cough etc.) before then, please call [phone].  If you test positive for Covid 19 in the 2 weeks post procedure, please call and report this information to [place].    If any biopsies were taken you will be contacted by phone or by letter within the next 1-3 weeks.  Please call us at [phone] if you have not heard about the biopsies in 3 weeks.  SIGNATURES/CONFIDENTIALITY: You and/or your care partner have signed paperwork which will be entered into your electronic medical record.  These signatures attest to the fact that that the information above on your After Visit Summary has been reviewed and is understood.  Full responsibility of the confidentiality of this discharge information lies with you and/or your care-partner.

## 2019-05-18 NOTE — Progress Notes (Signed)
Called to room to assist during endoscopic procedure.  Patient ID and intended procedure confirmed with present staff. Received instructions for my participation in the procedure from the performing physician.  

## 2019-05-18 NOTE — Op Note (Signed)
[facility] Patient Name: Holly Beasley Procedure Date: 05/18/2019 8:21 AM MRN: [id number] Endoscopist: Gatha Mayer , MD Age: 68 Referring MD:  Date of Birth: [birth year] Gender: Female Account #: [id number] Procedure:                Colonoscopy Indications:              Surveillance: Personal history of adenomatous                            polyps on last colonoscopy > 5 years ago Medicines:                Propofol per Anesthesia, Monitored Anesthesia Care Procedure:                Pre-Anesthesia Assessment:                           - Prior to the procedure, a History and Physical                            was performed, and patient medications and                            allergies were reviewed. The patient's tolerance of                            previous anesthesia was also reviewed. The risks                            and benefits of the procedure and the sedation                            options and risks were discussed with the patient.                            All questions were answered, and informed consent                            was obtained. Prior Anticoagulants: The patient has                            taken no previous anticoagulant or antiplatelet                            agents. ASA Grade Assessment: III - A patient with                            severe systemic disease. After reviewing the risks                            and benefits, the patient was deemed in                            satisfactory condition to undergo the procedure.  After obtaining informed consent, the colonoscope                            was passed under direct vision. Throughout the                            procedure, the patient's blood pressure, pulse, and                            oxygen saturations were monitored continuously. The                            Colonoscope was introduced through the anus and     advanced to the the cecum, identified by                            appendiceal orifice and ileocecal valve. The                            colonoscopy was somewhat difficult due to                            significant looping. Successful completion of the                            procedure was aided by applying abdominal pressure.                            The patient tolerated the procedure well. The                            quality of the bowel preparation was excellent. The                            ileocecal valve, appendiceal orifice, and rectum                            were photographed. The bowel preparation used was                            Miralax via split dose instruction. Scope In: 8:32:58 AM Scope Out: 9:01:12 AM Scope Withdrawal Time: 0 hours 17 minutes 0 seconds  Total Procedure Duration: 0 hours 28 minutes 14 seconds  Findings:                 Three sessile polyps were found in the descending                            colon. The polyps were 3 to 8 mm in size. These                            polyps were removed with a cold snare. Resection  and retrieval were complete. Verification of                            patient identification for the specimen was done.                            Estimated blood loss was minimal.                           Multiple sessile polyps were found in the sigmoid                            colon, descending colon, transverse colon,                            ascending colon and cecum. The polyps were 1 to 3                            mm in size. These polyps were removed with a cold                            biopsy forceps. Resection and retrieval were                            complete. Verification of patient identification                            for the specimen was done. Estimated blood loss was                            minimal.                           Multiple diverticula were found in  the sigmoid                            colon.                           A diffuse area of melanotic mucosa was found in the                            entire colon.                           The exam was otherwise without abnormality on                            direct and retroflexion views. Complications:            No immediate complications. Estimated Blood Loss:     Estimated blood loss was minimal. Impression:               - Three 3 to 8 mm polyps in the descending colon,  removed with a cold snare. Resected and retrieved.                           - Multiple 1 to 3 mm polyps in the sigmoid colon,                            in the descending colon, in the transverse colon,                            in the ascending colon and in the cecum, removed                            with a cold biopsy forceps. Resected and retrieved.                            13 total                           Grand total 16 polyps max 8 mm                           - Diverticulosis in the sigmoid colon.                           - Melanotic mucosa in the entire examined colon.                           - The examination was otherwise normal on direct                            and retroflexion views.                           - Personal history of colonic polyp 9 mm adenoma                            2004, no polyps 2010 - so this colonoscopy timing                            was appropriate per current guidelines. Recommendation:           - Patient has a contact number available for                            emergencies. The signs and symptoms of potential                            delayed complications were discussed with the                            patient. Return to normal activities tomorrow.                            Written discharge instructions were provided to the  patient.                           - Resume previous diet.                            - Continue present medications.                           - Repeat colonoscopy is recommended for                            surveillance. The colonoscopy date will be                            determined after pathology results from today's                            exam become available for review. Gatha Mayer, MD 05/18/2019 9:13:47 AM This report has been signed electronically.

## 2019-05-18 NOTE — Progress Notes (Signed)
Pt's states no medical or surgical changes since previsit or office visit.  Covid-[place]

## 2019-05-18 NOTE — Progress Notes (Signed)
To PACU, VSS. Report to Rn.tb 

## 2019-05-20 ENCOUNTER — Other Ambulatory Visit: Payer: Self-pay | Admitting: Internal Medicine

## 2019-05-20 ENCOUNTER — Telehealth: Payer: Self-pay

## 2019-05-20 NOTE — Telephone Encounter (Signed)
Attempted to reach patient for post-procedure f/u call. No answer. This is the 2nd call. Left message to please not hesitate to call us if she has any questions/concerns regarding her care.

## 2019-05-22 ENCOUNTER — Encounter: Payer: Self-pay | Admitting: Internal Medicine

## 2019-05-22 NOTE — Progress Notes (Signed)
Mix of adenomas, inflammatory and hyoperplastic polyps Recall 2023 My Chart

## 2019-05-24 ENCOUNTER — Other Ambulatory Visit: Payer: Self-pay | Admitting: Internal Medicine

## 2019-05-24 DIAGNOSIS — Z1231 Encounter for screening mammogram for malignant neoplasm of breast: Secondary | ICD-10-CM

## 2019-05-30 ENCOUNTER — Other Ambulatory Visit: Payer: Self-pay | Admitting: Internal Medicine

## 2019-06-05 ENCOUNTER — Other Ambulatory Visit: Payer: Self-pay | Admitting: Internal Medicine

## 2019-06-17 ENCOUNTER — Telehealth: Payer: Self-pay

## 2019-06-17 ENCOUNTER — Other Ambulatory Visit: Payer: Self-pay

## 2019-06-17 MED ORDER — METFORMIN HCL 500 MG PO TABS: 500.0000 mg | ORAL_TABLET | Freq: Three times a day (TID) | ORAL | 0 refills | Status: DC

## 2019-06-17 NOTE — Telephone Encounter (Signed)
lvm for pt to call back crm created okay to disclose medication already sent in

## 2019-06-17 NOTE — Telephone Encounter (Signed)
Change to metformin 500 ( not xr) and take 1 po tid   Disp 30 days to be sure tolerated  Disp 90 refill x 1 and if ok then refill 90 days

## 2019-06-17 NOTE — Telephone Encounter (Signed)
Copied from [place] [id number]. Topic: General - Inquiry >> Jun 17, 2019 10:27 AM Richardo Priest, NT wrote: Reason for CRM: Patient called in stating that the metFORMIN (GLUCOPHAGE-XR) 500 MG 24 hr tablet, has been recalled and needing an alternative. Please advise.

## 2019-07-06 ENCOUNTER — Ambulatory Visit
Admission: RE | Admit: 2019-07-06 | Discharge: 2019-07-06 | Disposition: A | Discharge status: Home / Self Care | Payer: 59 | Source: Ambulatory Visit | Attending: Internal Medicine | Admitting: Internal Medicine

## 2019-07-06 ENCOUNTER — Other Ambulatory Visit: Payer: Self-pay

## 2019-07-06 DIAGNOSIS — Z1231 Encounter for screening mammogram for malignant neoplasm of breast: Secondary | ICD-10-CM

## 2019-07-24 ENCOUNTER — Other Ambulatory Visit: Payer: Self-pay | Admitting: Internal Medicine

## 2019-07-30 ENCOUNTER — Telehealth: Payer: Self-pay | Admitting: Nurse Practitioner

## 2019-08-01 ENCOUNTER — Other Ambulatory Visit: Payer: Self-pay

## 2019-08-01 MED ORDER — METFORMIN HCL 500 MG PO TABS: 500.0000 mg | ORAL_TABLET | Freq: Three times a day (TID) | ORAL | 0 refills | Status: DC

## 2019-08-01 MED ORDER — CARVEDILOL 12.5 MG PO TABS: ORAL_TABLET | ORAL | 3 refills | Status: DC

## 2019-08-01 NOTE — Telephone Encounter (Signed)
Pt scheduled med refill appointment, but will be out tomorrow.  She wants to know if she can have enough to get through to next OV.

## 2019-08-01 NOTE — Telephone Encounter (Signed)
Medication sent in appt was not needed appt was seen in July and has appt in January

## 2019-08-10 ENCOUNTER — Other Ambulatory Visit: Payer: Self-pay | Admitting: Internal Medicine

## 2019-08-17 ENCOUNTER — Telehealth: Payer: Self-pay | Admitting: Internal Medicine

## 2019-08-19 ENCOUNTER — Ambulatory Visit: Payer: 59 | Admitting: Internal Medicine

## 2019-08-31 ENCOUNTER — Other Ambulatory Visit: Payer: Self-pay

## 2019-08-31 ENCOUNTER — Telehealth: Payer: Self-pay | Admitting: Internal Medicine

## 2019-08-31 MED ORDER — METFORMIN HCL 500 MG PO TABS: 500.0000 mg | ORAL_TABLET | Freq: Three times a day (TID) | ORAL | 1 refills | Status: DC

## 2019-08-31 NOTE — Telephone Encounter (Signed)
Pt called in, Rx for metFORMIN (GLUCOPHAGE) 500 MG tablet should have been sent to the pharmacy below instead. Pt says that she no longer uses the mail order. Removed mail order from pt's chart per pt.       [id number] - [place] [place] [address]

## 2019-08-31 NOTE — Telephone Encounter (Signed)
Pt calling to check in on this.  States that a 30 day supply was sent in for her but for insurance to cover it needs to be written as a 90 day supply.

## 2019-08-31 NOTE — Telephone Encounter (Signed)
Pt notified that 90 day supply has been sent in

## 2019-10-04 ENCOUNTER — Other Ambulatory Visit: Payer: Self-pay | Admitting: Internal Medicine

## 2019-10-24 ENCOUNTER — Other Ambulatory Visit: Payer: Self-pay | Admitting: Internal Medicine

## 2019-10-24 ENCOUNTER — Other Ambulatory Visit: Payer: Self-pay

## 2019-10-24 ENCOUNTER — Encounter: Payer: Self-pay | Admitting: Internal Medicine

## 2019-10-24 ENCOUNTER — Ambulatory Visit (INDEPENDENT_AMBULATORY_CARE_PROVIDER_SITE_OTHER): Payer: 59 | Admitting: Internal Medicine

## 2019-10-24 VITALS — BP 110/70 | HR 75 | Temp 98.0°F | Ht 69.0 in | Wt 193.0 lb

## 2019-10-24 DIAGNOSIS — I10 Essential (primary) hypertension: Secondary | ICD-10-CM

## 2019-10-24 DIAGNOSIS — Z79899 Other long term (current) drug therapy: Secondary | ICD-10-CM | POA: Diagnosis not present

## 2019-10-24 DIAGNOSIS — E785 Hyperlipidemia, unspecified: Secondary | ICD-10-CM | POA: Diagnosis not present

## 2019-10-24 DIAGNOSIS — E119 Type 2 diabetes mellitus without complications: Secondary | ICD-10-CM

## 2019-10-24 DIAGNOSIS — Z23 Encounter for immunization: Secondary | ICD-10-CM

## 2019-10-24 DIAGNOSIS — L309 Dermatitis, unspecified: Secondary | ICD-10-CM

## 2019-10-24 NOTE — Progress Notes (Signed)
This visit occurred during the SARS-CoV-2 public health emergency.  Safety protocols were in place, including screening questions prior to the visit, additional usage of staff PPE, and extensive cleaning of exam room while observing appropriate contact time as indicated for disinfecting solutions.    Chief Complaint  Patient presents with  . 6 month Follow-up    HPI: Holly Beasley 70 y.o. come in for Chronic disease management   Just started  Allergy shots : for sinus on going sx   DM: " been bad over holidays"   Late  Night snacking.    Je;;y beans takong 4 x500 metformin no numbness  Or eye changes  Doing well   Bp ok   Taking med not checking readings at this time  Bad knees   [place].  Sees her and spinal stenosis  Back  Dr Patrice Paradise PA  Psych  Doing ok  On cymbalta  Has picking behavior  no changes no new depression used to see dr Casimiro Needle but released and we are managing her medications   NO recurrence of svt  ROS: See pertinent positives and negatives per HPI. No cp sob  New dx sees derm for  Skin no numbness or vision changes  Past Medical History:  Diagnosis Date  . Depression   . Diabetes mellitus without complication ([place])   . GERD (gastroesophageal reflux disease)   . History of ETOH abuse    recovering  in remission  . Hyperlipidemia   . Hypertension   . Osteoarthritis   . Osteopenia    dexa -1.7 h -1.35   . Scoliosis    leg length discrepancy  . Skin cancer    basal cell nose  . Sleep apnea    did not meet criteria for cpap-has mouthpiece not using per pt  . SVT (supraventricular tachycardia) ([place]) 08/15/2016  . Varicose veins   . Wears contact lenses     Family History  Problem Relation Age of Onset  . Lymphoma Mother   . Diabetes Mother   . Hyperlipidemia Mother   . Colon polyps Mother   . Heart failure Father   . Hyperlipidemia Father   . Arthritis Brother   . Alcohol abuse Brother   . Alcohol abuse Brother   . Colon cancer Neg Hx   .  Esophageal cancer Neg Hx   . Rectal cancer Neg Hx   . Stomach cancer Neg Hx       Outpatient Medications Prior to Visit  Medication Sig Dispense Refill  . atorvastatin (LIPITOR) 40 MG tablet TAKE 1 TABLET BY MOUTH EVERY DAY 90 tablet 1  . azelastine (ASTELIN) 0.1 % nasal spray INSTILL 1 2 SPRAYS INTO EACH NOSTRIL TWICE A DAY    . BLACK ELDERBERRY,BERRY-FLOWER, PO Take 400 mg by mouth daily.     . calcipotriene-betamethasone (TACLONEX) ointment as needed.  2  . carvedilol (COREG) 12.5 MG tablet TAKE 1 & 1/2 TABLET TWICE A DAY 270 tablet 3  . Cyanocobalamin (B-12) 2000 MCG TABS Take by mouth daily.    . cycloSPORINE (RESTASIS) 0.05 % ophthalmic emulsion Place 1 drop into both eyes at bedtime.    . DULoxetine (CYMBALTA) 60 MG capsule TAKE 2 CAPSULES BY MOUTH AT BEDTIME 180 capsule 0  . EPINEPHrine 0.3 mg/0.3 mL IJ SOAJ injection USE AS DIRECTED AS NEEDED    . HYDROcodone-acetaminophen (NORCO) 7.5-325 MG tablet TAKE 1 TABLET BY MOUTH EVERY 8 HOURS AS NEEDED FOR PAIN    . levocetirizine (XYZAL) 5  MG tablet TAKE 1 TABLET BY MOUTH EVERY DAY IN THE EVENING    . losartan (COZAAR) 100 MG tablet TAKE 1 TABLET BY MOUTH EVERY DAY 90 tablet 0  . Magnesium 400 MG TABS Take 400 mg by mouth 2 (two) times daily.    . metFORMIN (GLUCOPHAGE) 500 MG tablet Take 1 tablet (500 mg total) by mouth 3 (three) times daily. (Patient taking differently: Take 500 mg by mouth 4 (four) times daily. ) 270 tablet 1  . Zinc 25 MG TABS Take by mouth daily.    Marland Kitchen omeprazole (PRILOSEC) 40 MG capsule TAKE 1 CAPSULE BY MOUTH EVERY DAY 90 capsule 0  . pregabalin (LYRICA) 50 MG capsule TAKE 2 CAPSULES BY MOUTH EVERY MORNING AND 3 CAPS AT BEDTIME    . bisacodyl (DULCOLAX) 5 MG EC tablet Take 5 mg by mouth once.    . Cranberry 500 MG TABS Take by mouth.    Marland Kitchen HYDROcodone-acetaminophen (NORCO) 7.5-325 MG tablet Take 1 tablet by mouth every 8 (eight) hours as needed. for pain    . metFORMIN (GLUCOPHAGE-XR) 500 MG 24 hr tablet TAKE 3  TABLETS BY MOUTH EVERY DAY 270 tablet 0  . Misc Natural Products (COLON CARE PO) Take by mouth.    . montelukast (SINGULAIR) 10 MG tablet TAKE 1 TABLET BY MOUTH EVERYDAY AT BEDTIME 90 tablet 1  . MULTIPLE VITAMIN PO Take 1 tablet by mouth daily.    Marland Kitchen OMNARIS 50 MCG/ACT nasal spray PLACE 2 SPRAYS INTO BOTH NOSTRILS DAILY. 12.5 g 5  . Polyethylene Glycol 3350 (MIRALAX PO) Take by mouth once. For colonoscopy     No facility-administered medications prior to visit.     EXAM:  BP 110/70 (BP Location: Left Arm, Patient Position: Sitting, Cuff Size: Large)   Pulse 75   Temp 98 F (36.7 C) (Temporal)   Ht 5\' 9"  (1.753 m)   Wt 193 lb (87.5 kg)   SpO2 95%   BMI 28.50 kg/m   Body mass index is 28.5 kg/m.  GENERAL: vitals reviewed and listed above, alert, oriented, appears well hydrated and in no acute distress HEENT: atraumatic, conjunctiva  clear, no obvious abnormalities on inspection of external nose and ears OP :masked NECK: no obvious masses on inspection palpation  LUNGS: clear to auscultation bilaterally, no wheezes, rales or rhonchi, good air movement CV: HRRR, no clubbing cyanosis or  peripheral edema nl cap refill  MS: moves all extremities without noticeable focal  abnormality PSYCH: pleasant and cooperative, no obvious depression or anxiety Diabetic Foot Exam - Simple   Simple Foot Form Diabetic Foot exam was performed with the following findings: Yes 10/24/2019  9:26 AM  Visual Inspection See comments: Yes Sensation Testing Intact to touch and monofilament testing bilaterally: Yes Pulse Check Posterior Tibialis and Dorsalis pulse intact bilaterally: Yes Comments Cracking and peeling plantar surgace and thickened toenails    No ulcers  Or infection pat seh has picking behaviro for the slin cracks   Using topicals  For psoriasis       Lab Results  Component Value Date   WBC 7.6 10/18/2018   HGB 12.0 10/18/2018   HCT 35.6 (L) 10/18/2018   PLT 296.0 10/18/2018    GLUCOSE 192 (H) 10/18/2018   CHOL 157 10/18/2018   TRIG 130.0 10/18/2018   HDL 45.20 10/18/2018   LDLDIRECT 171.0 10/10/2016   LDLCALC 86 10/18/2018   ALT 15 10/18/2018   AST 14 10/18/2018   NA 139 10/18/2018   K 4.5  10/18/2018   CL 103 10/18/2018   CREATININE 0.73 10/18/2018   BUN 12 10/18/2018   CO2 28 10/18/2018   TSH 1.49 10/18/2018   HGBA1C 7.3 (A) 04/18/2019   MICROALBUR <0.7 10/18/2018   BP Readings from Last 3 Encounters:  10/24/19 110/70  05/18/19 105/75  04/18/19 130/72   Ate this am protein shake.  So come back for fasting lab  ASSESSMENT AND PLAN:  Discussed the following assessment and plan:  Controlled type 2 diabetes mellitus without complication, without long-term current use of insulin ([place]) - Plan: Basic metabolic panel, CBC with Differential, Hemoglobin A1c, Hepatic function panel, Lipid panel, Microalbumin / creatinine urine ratio, TSH  Medication management - depression in remission  continue  - Plan: Basic metabolic panel, CBC with Differential, Hemoglobin A1c, Hepatic function panel, Lipid panel, Microalbumin / creatinine urine ratio, TSH  Hyperlipidemia, unspecified hyperlipidemia type - Plan: Basic metabolic panel, CBC with Differential, Hemoglobin A1c, Hepatic function panel, Lipid panel, Microalbumin / creatinine urine ratio, TSH  Foot dermatitis - Plan: Basic metabolic panel, CBC with Differential, Hemoglobin A1c, Hepatic function panel, Lipid panel, Microalbumin / creatinine urine ratio, TSH  Essential hypertension - Plan: Basic metabolic panel, CBC with Differential, Hemoglobin A1c, Hepatic function panel, Lipid panel, Microalbumin / creatinine urine ratio, TSH  Need for Tdap vaccination - Plan: Tdap vaccine greater than or equal to 7yo IM Knee and spinal stenosis  Managed  By specialty  Disc covid 19 vaccine when eligible  Disc avoiding picking behavior  -Patient advised to return or notify health care team  if  new concerns arise. Outside  external source  DATA REVIEWED: EHR   Total time on date  of service including record review ordering and plan of care:   38 minutes     Patient Instructions  Continue l atenmtionifestyle intervention healthy eating and exercise  Get appt for fasting labs. Plan CPX in 4-6 months or as needed  Depending       Mariann Laster K. Enid Maultsby M.D.

## 2019-10-24 NOTE — Patient Instructions (Signed)
Continue l atenmtionifestyle intervention healthy eating and exercise  Get appt for fasting labs. Plan CPX in 4-6 months or as needed  Depending

## 2019-10-26 ENCOUNTER — Other Ambulatory Visit: Payer: 59

## 2019-11-01 ENCOUNTER — Other Ambulatory Visit: Payer: Self-pay | Admitting: Internal Medicine

## 2019-11-03 ENCOUNTER — Ambulatory Visit: Payer: 59 | Attending: Internal Medicine

## 2019-11-03 DIAGNOSIS — Z23 Encounter for immunization: Secondary | ICD-10-CM | POA: Insufficient documentation

## 2019-11-03 NOTE — Progress Notes (Signed)
   Covid-19 Vaccination Clinic  Name:  Holly Beasley    MRN: [id number] DOB: Nov 05, 1950  11/03/2019  [name] was observed post Covid-19 immunization for 15 minutes without incidence. She was provided with Vaccine Information Sheet and instruction to access the V-Safe system.   [name] was instructed to call 911 with any severe reactions post vaccine: Marland Kitchen Difficulty breathing  . Swelling of your face and throat  . A fast heartbeat  . A bad rash all over your body  . Dizziness and weakness    Immunizations Administered    Name Date Dose VIS Date Route   Pfizer COVID-19 Vaccine 11/03/2019  9:08 AM 0.3 mL 09/23/2019 Intramuscular   Manufacturer: [place]   Lot: [id number]   [place]: [id number]

## 2019-11-08 ENCOUNTER — Other Ambulatory Visit: Payer: Self-pay | Admitting: Internal Medicine

## 2019-11-11 ENCOUNTER — Other Ambulatory Visit: Payer: Self-pay | Admitting: Internal Medicine

## 2019-11-15 ENCOUNTER — Other Ambulatory Visit: Payer: Self-pay

## 2019-11-15 ENCOUNTER — Other Ambulatory Visit (INDEPENDENT_AMBULATORY_CARE_PROVIDER_SITE_OTHER): Payer: 59

## 2019-11-15 DIAGNOSIS — I10 Essential (primary) hypertension: Secondary | ICD-10-CM

## 2019-11-15 DIAGNOSIS — E119 Type 2 diabetes mellitus without complications: Secondary | ICD-10-CM

## 2019-11-15 DIAGNOSIS — L309 Dermatitis, unspecified: Secondary | ICD-10-CM

## 2019-11-15 DIAGNOSIS — Z79899 Other long term (current) drug therapy: Secondary | ICD-10-CM

## 2019-11-15 DIAGNOSIS — E785 Hyperlipidemia, unspecified: Secondary | ICD-10-CM | POA: Diagnosis not present

## 2019-11-15 LAB — CBC WITH DIFFERENTIAL/PLATELET
Basophils Absolute: 0.1 10*3/uL (ref 0.0–0.1)
Basophils Relative: 1.1 % (ref 0.0–3.0)
Eosinophils Absolute: 0.5 10*3/uL (ref 0.0–0.7)
Eosinophils Relative: 7.7 % — ABNORMAL HIGH (ref 0.0–5.0)
HCT: 35.9 % — ABNORMAL LOW (ref 36.0–46.0)
Hemoglobin: 12.2 g/dL (ref 12.0–15.0)
Lymphocytes Relative: 44.5 % (ref 12.0–46.0)
Lymphs Abs: 3 10*3/uL (ref 0.7–4.0)
MCHC: 34 g/dL (ref 30.0–36.0)
MCV: 91.4 fl (ref 78.0–100.0)
Monocytes Absolute: 0.5 10*3/uL (ref 0.1–1.0)
Monocytes Relative: 7.3 % (ref 3.0–12.0)
Neutro Abs: 2.6 10*3/uL (ref 1.4–7.7)
Neutrophils Relative %: 39.4 % — ABNORMAL LOW (ref 43.0–77.0)
Platelets: 286 10*3/uL (ref 150.0–400.0)
RBC: 3.93 Mil/uL (ref 3.87–5.11)
RDW: 13 % (ref 11.5–15.5)
WBC: 6.7 10*3/uL (ref 4.0–10.5)

## 2019-11-15 LAB — TSH: TSH: 1.77 u[IU]/mL (ref 0.35–4.50)

## 2019-11-15 LAB — HEPATIC FUNCTION PANEL
ALT: 20 U/L (ref 0–35)
AST: 16 U/L (ref 0–37)
Albumin: 4 g/dL (ref 3.5–5.2)
Alkaline Phosphatase: 72 U/L (ref 39–117)
Bilirubin, Direct: 0.1 mg/dL (ref 0.0–0.3)
Total Bilirubin: 0.5 mg/dL (ref 0.2–1.2)
Total Protein: 6.6 g/dL (ref 6.0–8.3)

## 2019-11-15 LAB — BASIC METABOLIC PANEL
BUN: 10 mg/dL (ref 6–23)
CO2: 27 mEq/L (ref 19–32)
Calcium: 9.4 mg/dL (ref 8.4–10.5)
Chloride: 104 mEq/L (ref 96–112)
Creatinine, Ser: 0.75 mg/dL (ref 0.40–1.20)
GFR: 76.69 mL/min (ref >60.00)
Glucose, Bld: 138 mg/dL — ABNORMAL HIGH (ref 70–99)
Potassium: 4 mEq/L (ref 3.5–5.1)
Sodium: 140 mEq/L (ref 135–145)

## 2019-11-15 LAB — MICROALBUMIN / CREATININE URINE RATIO
Creatinine,U: 56.8 mg/dL
Microalb Creat Ratio: 1.2 mg/g (ref 0.0–30.0)
Microalb, Ur: <0.7 mg/dL (ref 0.0–1.9)

## 2019-11-15 LAB — LIPID PANEL
Cholesterol: 173 mg/dL (ref 0–200)
HDL: 45.8 mg/dL (ref >39.00)
LDL Cholesterol: 96 mg/dL (ref 0–99)
NonHDL: 126.88
Total CHOL/HDL Ratio: 4
Triglycerides: 152 mg/dL — ABNORMAL HIGH (ref 0.0–149.0)
VLDL: 30.4 mg/dL (ref 0.0–40.0)

## 2019-11-15 LAB — HEMOGLOBIN A1C: Hgb A1c MFr Bld: 7.7 % — ABNORMAL HIGH (ref 4.6–6.5)

## 2019-11-15 NOTE — Progress Notes (Signed)
A1c did go up as you expected cholesterol could be better  . Intensify lifestyle interventions. As you planned and we can recheck your a1c at your next visit in summer

## 2019-11-24 ENCOUNTER — Ambulatory Visit: Payer: 59 | Attending: Internal Medicine

## 2019-11-24 DIAGNOSIS — Z23 Encounter for immunization: Secondary | ICD-10-CM | POA: Insufficient documentation

## 2019-11-24 NOTE — Progress Notes (Signed)
   Covid-19 Vaccination Clinic  Name:  Holly Beasley    MRN: [id number] DOB: 1951/09/02  11/24/2019  [name] was observed post Covid-19 immunization for 15 minutes without incidence. She was provided with Vaccine Information Sheet and instruction to access the V-Safe system.   [name] was instructed to call 911 with any severe reactions post vaccine: Marland Kitchen Difficulty breathing  . Swelling of your face and throat  . A fast heartbeat  . A bad rash all over your body  . Dizziness and weakness    Immunizations Administered    Name Date Dose VIS Date Route   Pfizer COVID-19 Vaccine 11/24/2019  9:09 AM 0.3 mL 09/23/2019 Intramuscular   Manufacturer: [place]   Lot: [id number]   [place]: [id number]

## 2019-12-05 ENCOUNTER — Telehealth: Payer: Self-pay | Admitting: Internal Medicine

## 2019-12-05 MED ORDER — METFORMIN HCL 500 MG PO TABS: 500.0000 mg | ORAL_TABLET | Freq: Three times a day (TID) | ORAL | 1 refills | Status: DC

## 2019-12-05 NOTE — Telephone Encounter (Signed)
Medication refill:Metformin 500mg  (90 day supply)  Pharmacy: [facility] [address] FAX: [phone]   Pt has had medication sent to [facility]. She does not use that service and ask that none of her medications be sent there.

## 2019-12-05 NOTE — Telephone Encounter (Signed)
Refill has been sent in.  

## 2019-12-08 ENCOUNTER — Ambulatory Visit: Payer: 59

## 2019-12-27 ENCOUNTER — Other Ambulatory Visit: Payer: Self-pay | Admitting: Internal Medicine

## 2020-01-23 ENCOUNTER — Other Ambulatory Visit: Payer: Self-pay | Admitting: Internal Medicine

## 2020-02-08 ENCOUNTER — Other Ambulatory Visit: Payer: Self-pay | Admitting: Internal Medicine

## 2020-04-14 ENCOUNTER — Other Ambulatory Visit: Payer: Self-pay | Admitting: Internal Medicine

## 2020-04-17 ENCOUNTER — Other Ambulatory Visit: Payer: Self-pay | Admitting: Internal Medicine

## 2020-04-23 ENCOUNTER — Other Ambulatory Visit: Payer: Self-pay | Admitting: Internal Medicine

## 2020-04-23 NOTE — Progress Notes (Signed)
Chief Complaint  Patient presents with  . Annual Exam    Doing okay    HPI: Holly Beasley 69 y.o. comes in today for Preventive Medicare exam/ wellness visit . And cdm Since last visit. BG not sure  Has lost wieght not eating as healthy now taking 4 per day metformin Left knee problem sees Dr Roland Earl for feet dr Arlee Muslim crusty and flaky and oon left palm  Picks off skin   Health Maintenance  Topic Date Due  . OPHTHALMOLOGY EXAM  06/14/2019  . INFLUENZA VACCINE  05/13/2020  . FOOT EXAM  10/23/2020  . HEMOGLOBIN A1C  10/25/2020  . MAMMOGRAM  07/05/2021  . COLONOSCOPY  05/17/2022  . TETANUS/TDAP  10/23/2029  . DEXA SCAN  Completed  . COVID-19 Vaccine  Completed  . Hepatitis C Screening  Completed  . PNA vac Low Risk Adult  Completed   Health Maintenance Review LIFESTYLE:  Exercise:    3x per week with personal trainer    Knee and back  Tobacco/ETS: Alcohol: n Sugar beverages: no Sleep:  6- 10  Drug use: no HH: 2 cats   Mag and zinc an b12     Hearing: ok  Vision:  No limitations at present . Last eye check UTD cataract surgery did well.   Safety:  Has smoke detector and wears seat belts.  No firearms. No excess sun exposure. Sees dentist regularly.  Falls: no  Advance directive :  Reviewed  Has one.  Memory: Felt to be good  , no concern from her or her family.  Depression: No anhedonia unusual crying or depressive symptoms mom passes  husband with prostate  cancer . Focus on pets  And work out.   Nutrition: Eats well balanced diet; adequate calcium and vitamin D. No swallowing chewing problems.  Injury: no major injuries in the last six months.  Other healthcare providers:  Reviewed today .  Preventive parameters: up-to-date  Reviewed   ADLS:   There are no problems or need for assistance  driving, feeding, obtaining food, dressing, toileting and bathing, managing money using phone. She is independent.   ROS:  GEN/ HEENT: No fever,  significant weight changes sweats headaches vision problems hearing changes, CV/ PULM; No chest pain shortness of breath cough, syncope,edema  change in exercise tolerance. GI /GU: No adominal pain, vomiting, change in bowel habits. No blood in the stool. No significant GU symptoms. SKIN/HEME: ,no acute skin rashes suspicious lesions or bleeding. No lymphadenopathy, nodules, masses.  NEURO/ PSYCH:  No neurologic signs such as weakness numbness. No depression anxiety. IMM/ Allergy: No unusual infections.  Allergy .   REST of 12 system review negative except as per HPI   Past Medical History:  Diagnosis Date  . Depression   . Diabetes mellitus without complication ([place])   . GERD (gastroesophageal reflux disease)   . History of ETOH abuse    recovering  in remission  . Hyperlipidemia   . Hypertension   . Osteoarthritis   . Osteopenia    dexa -1.7 h -1.35   . Scoliosis    leg length discrepancy  . Skin cancer    basal cell nose  . Sleep apnea    did not meet criteria for cpap-has mouthpiece not using per pt  . SVT (supraventricular tachycardia) ([place]) 08/15/2016  . Varicose veins   . Wears contact lenses     Family History  Problem Relation Age of Onset  . Lymphoma Mother   .  Diabetes Mother   . Hyperlipidemia Mother   . Colon polyps Mother   . Heart failure Father   . Hyperlipidemia Father   . Arthritis Brother   . Alcohol abuse Brother   . Alcohol abuse Brother   . Colon cancer Neg Hx   . Esophageal cancer Neg Hx   . Rectal cancer Neg Hx   . Stomach cancer Neg Hx     Social History   Socioeconomic History  . Marital status: Married    Spouse name: Not on file  . Number of children: Not on file  . Years of education: Not on file  . Highest education level: Not on file  Occupational History  . Not on file  Tobacco Use  . Smoking status: Never Smoker  . Smokeless tobacco: Never Used  Vaping Use  . Vaping Use: Never used  Substance and Sexual Activity  .  Alcohol use: No    Alcohol/week: 0.0 standard drinks    Comment: recovering-1983  . Drug use: No  . Sexual activity: Not on file  Other Topics Concern  . 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Educational psychologist   Married   ETOH recovering   [place] 2    Has family in [place]         Social Determinants of Health   Financial Resource Strain:   . Difficulty of Paying Living Expenses:   Food Insecurity:   . Worried About Charity fundraiser in the Last Year:   . Arboriculturist in the Last Year:   Transportation Needs:   . Film/video editor (Medical):   Marland Kitchen Lack of Transportation (Non-Medical):   Physical Activity:   . Days of Exercise per Week:   . Minutes of Exercise per Session:   Stress:   . Feeling of Stress :   Social Connections:   . Frequency of Communication with Friends and Family:   . Frequency of Social Gatherings with Friends and Family:   . Attends Religious Services:   . Active Member of Clubs or Organizations:   . Attends Archivist Meetings:   Marland Kitchen Marital Status:     Outpatient Encounter Medications as of 04/24/2020  Medication Sig  . atorvastatin (LIPITOR) 40 MG tablet TAKE 1 TABLET BY MOUTH EVERY DAY  . azelastine (ASTELIN) 0.1 % nasal spray INSTILL 1 2 SPRAYS INTO EACH NOSTRIL TWICE A DAY  . calcipotriene-betamethasone (TACLONEX) ointment as needed.  . carvedilol (COREG) 12.5 MG tablet TAKE 1 & 1/2 TABLET TWICE A DAY  . Cyanocobalamin (B-12) 2000 MCG TABS Take by mouth daily.  . cycloSPORINE (RESTASIS) 0.05 % ophthalmic emulsion Place 1 drop into both eyes at bedtime.  . DULoxetine (CYMBALTA) 60 MG capsule TAKE 2 CAPSULES BY MOUTH AT BEDTIME  . EPINEPHrine 0.3 mg/0.3 mL IJ SOAJ injection USE AS DIRECTED AS NEEDED  . HYDROcodone-acetaminophen (NORCO) 10-325 MG tablet Take 1 tablet by mouth daily as needed.  Marland Kitchen levocetirizine (XYZAL)  5 MG tablet TAKE 1 TABLET BY MOUTH EVERY DAY IN THE EVENING  . losartan (COZAAR) 100 MG tablet TAKE 1 TABLET BY MOUTH EVERY DAY  . Magnesium 400 MG TABS Take 400 mg by mouth 2 (two) times daily.  . metFORMIN (GLUCOPHAGE) 500 MG tablet Take 4 per day with meals  . montelukast (SINGULAIR) 10 MG  tablet TAKE 1 TABLET BY MOUTH EVERYDAY AT BEDTIME  . omeprazole (PRILOSEC) 40 MG capsule TAKE 1 CAPSULE BY MOUTH EVERY DAY  . pregabalin (LYRICA) 50 MG capsule TAKE 2 CAPSULES BY MOUTH EVERY MORNING AND 3 CAPS AT BEDTIME  . Zinc 25 MG TABS Take by mouth daily.  . [DISCONTINUED] metFORMIN (GLUCOPHAGE) 500 MG tablet Take 1 tablet (500 mg total) by mouth 3 (three) times daily.  Marland Kitchen BLACK ELDERBERRY,BERRY-FLOWER, PO Take 400 mg by mouth daily.  (Patient not taking: Reported on 04/24/2020)  . HYDROcodone-acetaminophen (NORCO) 7.5-325 MG tablet TAKE 1 TABLET BY MOUTH EVERY 8 HOURS AS NEEDED FOR PAIN (Patient not taking: Reported on 04/24/2020)   No facility-administered encounter medications on file as of 04/24/2020.    EXAM:  BP 120/72   Pulse 70   Temp 98.6 F (37 C) (Oral)   Ht 5' 8.5" (1.74 m)   Wt 189 lb 6.4 oz (85.9 kg)   SpO2 96%   BMI 28.38 kg/m   Body mass index is 28.38 kg/m.  Physical Exam: Vital signs reviewed EXB:MWUX is a well-developed well-nourished alert cooperative   who appears stated age in no acute distress.  HEENT: normocephalic atraumatic , Eyes: PERRL EOM's full, conjunctiva clear, Nares: paten,t no deformity discharge or tenderness., Ears: no deformity EAC's clear TMs with normal landmarks. Mouth: masked NECK: supple without masses, thyromegaly or bruits. CHEST/PULM:  Clear to auscultation and percussion breath sounds equal no wheeze , rales or rhonchi. No chest wall deformities or tenderness. CV: PMI is nondisplaced, S1 S2 no gallops, murmurs, rubs. Peripheral pulses are full without delay.No JVD .  ABDOMEN: Bowel sounds normal nontender  No guard or rebound, no hepato  splenomegal no CVA tenderness.   Extremtities:  No clubbing cyanosis or edema, no acute joint swelling or redness no focal atrophy has knee brace left  NEURO:  Oriented x3, cranial nerves 3-12 appear to be intact, no obvious focal weakness,gait within normal limits no abnormal reflexes or asymmetrical SKIN: No acute rashes normal turgor, color, no bruising or petechiae. Foot dermatitis and thickened nails   Left hypothenar palm is delineated red and peel  No weeping or satellite lesions  PSYCH: Oriented, good eye contact, no obvious depression anxiety, cognition and judgment appear normal. LN: no cervical axillary inguinal adenopathy No noted deficits in memory, attention, and speech.   Lab Results  Component Value Date   WBC 6.7 11/15/2019   HGB 12.2 11/15/2019   HCT 35.9 (L) 11/15/2019   PLT 286.0 11/15/2019   GLUCOSE 138 (H) 11/15/2019   CHOL 173 11/15/2019   TRIG 152.0 (H) 11/15/2019   HDL 45.80 11/15/2019   LDLDIRECT 171.0 10/10/2016   LDLCALC 96 11/15/2019   ALT 20 11/15/2019   AST 16 11/15/2019   NA 140 11/15/2019   K 4.0 11/15/2019   CL 104 11/15/2019   CREATININE 0.75 11/15/2019   BUN 10 11/15/2019   CO2 27 11/15/2019   TSH 1.77 11/15/2019   HGBA1C 7.1 (A) 04/24/2020   MICROALBUR <0.7 11/15/2019    ASSESSMENT AND PLAN:  Discussed the following assessment and plan:  Visit for preventive health examination  Medication management  Essential hypertension  Hyperlipidemia, unspecified hyperlipidemia type  Controlled type 2 diabetes mellitus without complication, without long-term current use of insulin ([place]) - Plan: POCT glycosylated hemoglobin (Hb A1C)  Estrogen deficiency - Plan: DG Bone Density dexa due  Last on 2018 and  Osteopenia  Patient Care Team: Burnis Medin, MD as PCP - General  Camillo Flaming, OD as Referring Physician (Optometry) Rolm Bookbinder, MD as Consulting Physician (Dermatology) Norma Fredrickson, MD as Consulting Physician  (Psychiatry) Thompson Grayer, MD as Consulting Physician (Cardiology) Starling Manns, MD (Orthopedic Surgery)  Patient Instructions  Make appt for bone density dexa at [facility] .  See your dermatologist as planned    [place] protect organs, store calcium, anchor muscles, and support the whole body. Keeping your bones strong is important, especially as you get older. You can take actions to help keep your bones strong and healthy. Why is keeping my bones healthy important?  Keeping your bones healthy is important because your body constantly replaces bone cells. Cells get old, and new cells take their place. As we age, we lose bone cells because the body may not be able to make enough new cells to replace the old cells. The amount of bone cells and bone tissue you have is referred to as bone mass. The higher your bone mass, the stronger your bones. The aging process leads to an overall loss of bone mass in the body, which can increase the likelihood of:  Joint pain and stiffness.  Broken bones.  A condition in which the bones become weak and brittle (osteoporosis). A large decline in bone mass occurs in older adults. In women, it occurs about the time of menopause. What actions can I take to keep my bones healthy? Good health habits are important for maintaining healthy bones. This includes eating nutritious foods and exercising regularly. To have healthy bones, you need to get enough of the right minerals and vitamins. Most nutrition experts recommend getting these nutrients from the foods that you eat. In some cases, taking supplements may also be recommended. Doing certain types of exercise is also important for bone health. What are the nutritional recommendations for healthy bones?  Eating a well-balanced diet with plenty of calcium and vitamin D will help to protect your bones. Nutritional recommendations vary from person to person. Ask your health care provider  what is healthy for you. Here are some general guidelines. Get enough calcium Calcium is the most important (essential) mineral for bone health. Most people can get enough calcium from their diet, but supplements may be recommended for people who are at risk for osteoporosis. Good sources of calcium include:  Dairy products, such as low-fat or nonfat milk, cheese, and yogurt.  Dark green leafy vegetables, such as bok choy and broccoli.  Calcium-fortified foods, such as orange juice, cereal, bread, soy beverages, and tofu products.  Nuts, such as almonds. Follow these recommended amounts for daily calcium intake:  Children, age 72-3: 700 mg.  Children, age 60-8: 1,000 mg.  Children, age 34-13: 1,300 mg.  Teens, age 82-18: 1,300 mg.  Adults, age 29-50: 1,000 mg.  Adults, age 54-70: ? Men: 1,000 mg. ? Women: 1,200 mg.  Adults, age 64 or older: 1,200 mg.  Pregnant and breastfeeding females: ? Teens: 1,300 mg. ? Adults: 1,000 mg. Get enough vitamin D Vitamin D is the most essential vitamin for bone health. It helps the body absorb calcium. Sunlight stimulates the skin to make vitamin D, so be sure to get enough sunlight. If you live in a cold climate or you do not get outside often, your health care provider may recommend that you take vitamin D supplements. Good sources of vitamin D in your diet include:  Egg yolks.  Saltwater fish.  Milk and cereal fortified with vitamin D. Follow these recommended  amounts for daily vitamin D intake:  Children and teens, age 9-18: 600 international units.  Adults, age 80 or younger: 400-800 international units.  Adults, age 67 or older: 800-1,000 international units. Get other important nutrients Other nutrients that are important for bone health include:  Phosphorus. This mineral is found in meat, poultry, dairy foods, nuts, and legumes. The recommended daily intake for adult men and adult women is 700 mg.  Magnesium. This mineral is  found in seeds, nuts, dark green vegetables, and legumes. The recommended daily intake for adult men is 400-420 mg. For adult women, it is 310-320 mg.  Vitamin K. This vitamin is found in green leafy vegetables. The recommended daily intake is 120 mg for adult men and 90 mg for adult women. What type of physical activity is best for building and maintaining healthy bones? Weight-bearing and strength-building activities are important for building and maintaining healthy bones. Weight-bearing activities cause muscles and bones to work against gravity. Strength-building activities increase the strength of the muscles that support bones. Weight-bearing and muscle-building activities include:  Walking and hiking.  Jogging and running.  Dancing.  Gym exercises.  Lifting weights.  Tennis and racquetball.  Climbing stairs.  Aerobics. Adults should get at least 30 minutes of moderate physical activity on most days. Children should get at least 60 minutes of moderate physical activity on most days. Ask your health care provider what type of exercise is best for you. How can I find out if my bone mass is low? Bone mass can be measured with an X-ray test called a bone mineral density (BMD) test. This test is recommended for all women who are age 91 or older. It may also be recommended for:  Men who are age 34 or older.  People who are at risk for osteoporosis because of: ? Having bones that break easily. ? Having a long-term disease that weakens bones, such as kidney disease or rheumatoid arthritis. ? Having menopause earlier than normal. ? Taking medicine that weakens bones, such as steroids, thyroid hormones, or hormone treatment for breast cancer or prostate cancer. ? Smoking. ? Drinking three or more alcoholic drinks a day. If you find that you have a low bone mass, you may be able to prevent osteoporosis or further bone loss by changing your diet and lifestyle. Where can I find more  information? For more information, check out the following websites:  [place]: AviationTales.fr  [employer] of Health: www.bones.SouthExposed.es  International Osteoporosis Foundation: Administrator.iofbonehealth.org Summary  The aging process leads to an overall loss of bone mass in the body, which can increase the likelihood of broken bones and osteoporosis.  Eating a well-balanced diet with plenty of calcium and vitamin D will help to protect your bones.  Weight-bearing and strength-building activities are also important for building and maintaining strong bones.  Bone mass can be measured with an X-ray test called a bone mineral density (BMD) test. This information is not intended to replace advice given to you by your health care provider. Make sure you discuss any questions you have with your health care provider. Document Revised: 10/26/2017 Document Reviewed: 10/26/2017 Elsevier Patient Education  2020 Brandon Antwanette Wesche M.D.

## 2020-04-24 ENCOUNTER — Encounter: Payer: Self-pay | Admitting: Internal Medicine

## 2020-04-24 ENCOUNTER — Ambulatory Visit (INDEPENDENT_AMBULATORY_CARE_PROVIDER_SITE_OTHER): Payer: 59 | Admitting: Internal Medicine

## 2020-04-24 VITALS — BP 120/72 | HR 70 | Temp 98.6°F | Ht 68.5 in | Wt 189.4 lb

## 2020-04-24 DIAGNOSIS — I10 Essential (primary) hypertension: Secondary | ICD-10-CM | POA: Diagnosis not present

## 2020-04-24 DIAGNOSIS — E119 Type 2 diabetes mellitus without complications: Secondary | ICD-10-CM

## 2020-04-24 DIAGNOSIS — Z79899 Other long term (current) drug therapy: Secondary | ICD-10-CM

## 2020-04-24 DIAGNOSIS — E2839 Other primary ovarian failure: Secondary | ICD-10-CM

## 2020-04-24 DIAGNOSIS — E785 Hyperlipidemia, unspecified: Secondary | ICD-10-CM

## 2020-04-24 DIAGNOSIS — Z0000 Encounter for general adult medical examination without abnormal findings: Secondary | ICD-10-CM

## 2020-04-24 LAB — POCT GLYCOSYLATED HEMOGLOBIN (HGB A1C): Hemoglobin A1C: 7.1 % — AB (ref 4.0–5.6)

## 2020-04-24 MED ORDER — METFORMIN HCL 500 MG PO TABS: ORAL_TABLET | ORAL | 3 refills | Status: DC

## 2020-04-24 NOTE — Patient Instructions (Signed)
Make appt for bone density dexa at [facility] .  See your dermatologist as planned    [place] protect organs, store calcium, anchor muscles, and support the whole body. Keeping your bones strong is important, especially as you get older. You can take actions to help keep your bones strong and healthy. Why is keeping my bones healthy important?  Keeping your bones healthy is important because your body constantly replaces bone cells. Cells get old, and new cells take their place. As we age, we lose bone cells because the body may not be able to make enough new cells to replace the old cells. The amount of bone cells and bone tissue you have is referred to as bone mass. The higher your bone mass, the stronger your bones. The aging process leads to an overall loss of bone mass in the body, which can increase the likelihood of:  Joint pain and stiffness.  Broken bones.  A condition in which the bones become weak and brittle (osteoporosis). A large decline in bone mass occurs in older adults. In women, it occurs about the time of menopause. What actions can I take to keep my bones healthy? Good health habits are important for maintaining healthy bones. This includes eating nutritious foods and exercising regularly. To have healthy bones, you need to get enough of the right minerals and vitamins. Most nutrition experts recommend getting these nutrients from the foods that you eat. In some cases, taking supplements may also be recommended. Doing certain types of exercise is also important for bone health. What are the nutritional recommendations for healthy bones?  Eating a well-balanced diet with plenty of calcium and vitamin D will help to protect your bones. Nutritional recommendations vary from person to person. Ask your health care provider what is healthy for you. Here are some general guidelines. Get enough calcium Calcium is the most important (essential) mineral for  bone health. Most people can get enough calcium from their diet, but supplements may be recommended for people who are at risk for osteoporosis. Good sources of calcium include:  Dairy products, such as low-fat or nonfat milk, cheese, and yogurt.  Dark green leafy vegetables, such as bok choy and broccoli.  Calcium-fortified foods, such as orange juice, cereal, bread, soy beverages, and tofu products.  Nuts, such as almonds. Follow these recommended amounts for daily calcium intake:  Children, age 27-3: 700 mg.  Children, age 31-8: 1,000 mg.  Children, age 316-13: 1,300 mg.  Teens, age 26-18: 1,300 mg.  Adults, age 83-50: 1,000 mg.  Adults, age 70-70: ? Men: 1,000 mg. ? Women: 1,200 mg.  Adults, age 319 or older: 1,200 mg.  Pregnant and breastfeeding females: ? Teens: 1,300 mg. ? Adults: 1,000 mg. Get enough vitamin D Vitamin D is the most essential vitamin for bone health. It helps the body absorb calcium. Sunlight stimulates the skin to make vitamin D, so be sure to get enough sunlight. If you live in a cold climate or you do not get outside often, your health care provider may recommend that you take vitamin D supplements. Good sources of vitamin D in your diet include:  Egg yolks.  Saltwater fish.  Milk and cereal fortified with vitamin D. Follow these recommended amounts for daily vitamin D intake:  Children and teens, age 27-18: 600 international units.  Adults, age 71 or younger: 400-800 international units.  Adults, age 317 or older: 800-1,000 international units. Get other important nutrients Other nutrients  that are important for bone health include:  Phosphorus. This mineral is found in meat, poultry, dairy foods, nuts, and legumes. The recommended daily intake for adult men and adult women is 700 mg.  Magnesium. This mineral is found in seeds, nuts, dark green vegetables, and legumes. The recommended daily intake for adult men is 400-420 mg. For adult women, it  is 310-320 mg.  Vitamin K. This vitamin is found in green leafy vegetables. The recommended daily intake is 120 mg for adult men and 90 mg for adult women. What type of physical activity is best for building and maintaining healthy bones? Weight-bearing and strength-building activities are important for building and maintaining healthy bones. Weight-bearing activities cause muscles and bones to work against gravity. Strength-building activities increase the strength of the muscles that support bones. Weight-bearing and muscle-building activities include:  Walking and hiking.  Jogging and running.  Dancing.  Gym exercises.  Lifting weights.  Tennis and racquetball.  Climbing stairs.  Aerobics. Adults should get at least 30 minutes of moderate physical activity on most days. Children should get at least 60 minutes of moderate physical activity on most days. Ask your health care provider what type of exercise is best for you. How can I find out if my bone mass is low? Bone mass can be measured with an X-ray test called a bone mineral density (BMD) test. This test is recommended for all women who are age 53 or older. It may also be recommended for:  Men who are age 68 or older.  People who are at risk for osteoporosis because of: ? Having bones that break easily. ? Having a long-term disease that weakens bones, such as kidney disease or rheumatoid arthritis. ? Having menopause earlier than normal. ? Taking medicine that weakens bones, such as steroids, thyroid hormones, or hormone treatment for breast cancer or prostate cancer. ? Smoking. ? Drinking three or more alcoholic drinks a day. If you find that you have a low bone mass, you may be able to prevent osteoporosis or further bone loss by changing your diet and lifestyle. Where can I find more information? For more information, check out the following websites:  [place]: AviationTales.fr  [employer] of Health: www.bones.SouthExposed.es  International Osteoporosis Foundation: Administrator.iofbonehealth.org Summary  The aging process leads to an overall loss of bone mass in the body, which can increase the likelihood of broken bones and osteoporosis.  Eating a well-balanced diet with plenty of calcium and vitamin D will help to protect your bones.  Weight-bearing and strength-building activities are also important for building and maintaining strong bones.  Bone mass can be measured with an X-ray test called a bone mineral density (BMD) test. This information is not intended to replace advice given to you by your health care provider. Make sure you discuss any questions you have with your health care provider. Document Revised: 10/26/2017 Document Reviewed: 10/26/2017 Elsevier Patient Education  2020 [employer].

## 2020-04-30 ENCOUNTER — Other Ambulatory Visit: Payer: Self-pay

## 2020-04-30 ENCOUNTER — Ambulatory Visit (INDEPENDENT_AMBULATORY_CARE_PROVIDER_SITE_OTHER)
Admission: RE | Admit: 2020-04-30 | Discharge: 2020-04-30 | Disposition: A | Discharge status: Home / Self Care | Payer: 59 | Source: Ambulatory Visit | Attending: Internal Medicine | Admitting: Internal Medicine

## 2020-04-30 DIAGNOSIS — E2839 Other primary ovarian failure: Secondary | ICD-10-CM | POA: Diagnosis not present

## 2020-05-01 ENCOUNTER — Other Ambulatory Visit: Payer: Self-pay | Admitting: Internal Medicine

## 2020-05-01 NOTE — Progress Notes (Signed)
Bone denisty appears to be stable or slightly better . Still on low side

## 2020-05-09 ENCOUNTER — Other Ambulatory Visit: Payer: Self-pay | Admitting: Internal Medicine

## 2020-07-16 ENCOUNTER — Other Ambulatory Visit: Payer: Self-pay | Admitting: Internal Medicine

## 2020-07-22 ENCOUNTER — Other Ambulatory Visit: Payer: Self-pay | Admitting: Internal Medicine

## 2020-07-27 ENCOUNTER — Other Ambulatory Visit: Payer: Self-pay | Admitting: Internal Medicine

## 2020-09-04 ENCOUNTER — Other Ambulatory Visit: Payer: Self-pay | Admitting: Internal Medicine

## 2020-10-31 ENCOUNTER — Other Ambulatory Visit: Payer: Self-pay | Admitting: Internal Medicine

## 2020-12-23 ENCOUNTER — Other Ambulatory Visit: Payer: Self-pay | Admitting: Internal Medicine

## 2020-12-25 LAB — HM DIABETES EYE EXAM

## 2021-01-09 ENCOUNTER — Encounter: Payer: Self-pay | Admitting: Internal Medicine

## 2021-01-15 ENCOUNTER — Other Ambulatory Visit: Payer: Self-pay | Admitting: Internal Medicine

## 2021-02-13 ENCOUNTER — Encounter: Payer: Self-pay | Admitting: *Deleted

## 2021-02-13 ENCOUNTER — Encounter: Payer: Self-pay | Admitting: Internal Medicine

## 2021-02-13 ENCOUNTER — Ambulatory Visit (INDEPENDENT_AMBULATORY_CARE_PROVIDER_SITE_OTHER): Payer: 59 | Admitting: Internal Medicine

## 2021-02-13 ENCOUNTER — Other Ambulatory Visit: Payer: Self-pay

## 2021-02-13 VITALS — BP 138/74 | HR 72 | Ht 68.5 in | Wt 194.0 lb

## 2021-02-13 DIAGNOSIS — I872 Venous insufficiency (chronic) (peripheral): Secondary | ICD-10-CM

## 2021-02-13 DIAGNOSIS — R0602 Shortness of breath: Secondary | ICD-10-CM

## 2021-02-13 DIAGNOSIS — I10 Essential (primary) hypertension: Secondary | ICD-10-CM | POA: Diagnosis not present

## 2021-02-13 DIAGNOSIS — I471 Supraventricular tachycardia: Secondary | ICD-10-CM | POA: Diagnosis not present

## 2021-02-13 MED ORDER — CARVEDILOL 12.5 MG PO TABS: 12.5000 mg | ORAL_TABLET | Freq: Two times a day (BID) | ORAL | 3 refills | Status: DC

## 2021-02-13 NOTE — Progress Notes (Signed)
Electrophysiology Office Note   Date:  02/13/2021   ID:  Holly Beasley, DOB 27-Jan-1951, MRN [id number]  PCP:  Burnis Medin, MD  Cardiologist:  Previously Dr Acie Fredrickson Primary Electrophysiologist: Thompson Grayer, MD    CC: SOB   History of Present Illness: Holly Beasley is a 70 y.o. female who presents today for electrophysiology evaluation.   She was last seen by me 11/05/16 (my note reviewed). 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    She has not had SVT since seen by our office last.  She has noticed that over the past few months that she is having progressive SOB and fatigue.  She is not very active, primarily due to knee pain.  She has chronic edema which is stable.  Today, she denies symptoms of palpitations, chest pain, dizziness, presyncope, syncope, bleeding, or neurologic sequela. The patient is tolerating medications without difficulties and is otherwise without complaint today.    Past Medical History:  Diagnosis Date  . Depression   . Diabetes mellitus without complication ([place])   . GERD (gastroesophageal reflux disease)   . History of ETOH abuse    recovering  in remission  . Hyperlipidemia   . Hypertension   . Osteoarthritis   . Osteopenia    dexa -1.7 h -1.35   . Scoliosis    leg length discrepancy  . Skin cancer    basal cell nose  . Sleep apnea    did not meet criteria for cpap-has mouthpiece not using per pt  . SVT (supraventricular tachycardia) ([place]) 08/15/2016  . Varicose veins   . Wears contact lenses    Past Surgical History:  Procedure Laterality Date  . BREAST BIOPSY     2003-rt  . CATARACT EXTRACTION, BILATERAL  2019  . COLONOSCOPY W/ POLYPECTOMY  0737,1062   precancerous   2004  . DENTAL SURGERY  2015   bone graft   . KNEE ARTHROSCOPY WITH LATERAL MENISECTOMY Left 11/10/2014   Procedure: LEFT KNEE ARTHROSCOPY PARTIAL LATERAL MENISECTOMY CHONDROPLASTY;  Surgeon: Hessie Dibble, MD;  Location: [facility];  Service: Orthopedics;  Laterality: Left;  . NASAL SINUS SURGERY  11-13-2008   polyps removed  . skin cancer removal     basal cell face  . TONSILLECTOMY AND ADENOIDECTOMY  1967  . VENOUS ABLATION  2015   Dr Kellie Simmering     Current Outpatient Medications  Medication Sig Dispense Refill  . acitretin (SORIATANE) 25 MG capsule Take 25 mg by mouth daily.    Marland Kitchen atorvastatin (LIPITOR) 40 MG tablet Take 1 tablet (40 mg total) by mouth daily. **Please call the office to schedule an appointment for future refills** 30 tablet 0  . augmented betamethasone dipropionate (DIPROLENE-AF) 0.05 % ointment Apply 1 application topically daily.    Marland Kitchen azelastine (ASTELIN) 0.1 % nasal spray INSTILL 1 2 SPRAYS INTO EACH NOSTRIL TWICE A DAY    . carvedilol (COREG) 12.5 MG tablet TAKE 1 & 1/2 TABLET TWICE A DAY 270 tablet 1  . cetirizine (ZYRTEC) 10 MG tablet Take 1 tablet by mouth as needed for allergies.    . Cyanocobalamin (B-12) 2000 MCG TABS Take by mouth daily.    . cycloSPORINE (RESTASIS) 0.05 % ophthalmic emulsion Place 1 drop into both eyes at bedtime.    . DULoxetine (CYMBALTA) 60 MG capsule TAKE 2 CAPSULES BY MOUTH AT BEDTIME 180 capsule 2  . EPINEPHrine 0.3 mg/0.3 mL IJ SOAJ injection USE AS DIRECTED  AS NEEDED    . HYDROcodone-acetaminophen (NORCO) 10-325 MG tablet Take 1 tablet by mouth daily as needed.    Marland Kitchen levocetirizine (XYZAL) 5 MG tablet TAKE 1 TABLET BY MOUTH EVERY DAY IN THE EVENING    . losartan (COZAAR) 100 MG tablet TAKE 1 TABLET BY MOUTH EVERY DAY 90 tablet 1  . Magnesium 400 MG TABS Take 400 mg by mouth 2 (two) times daily.    . metFORMIN (GLUCOPHAGE) 500 MG tablet Take 4 per day with meals 360 tablet 3  . montelukast (SINGULAIR) 10 MG tablet TAKE 1 TABLET BY MOUTH EVERYDAY AT BEDTIME 90 tablet 1  . omeprazole (PRILOSEC) 40 MG capsule TAKE 1 CAPSULE BY MOUTH EVERY DAY 90 capsule 1  . pregabalin (LYRICA) 50 MG capsule TAKE 2 CAPSULES BY MOUTH EVERY MORNING AND 3 CAPS AT BEDTIME      No current facility-administered medications for this visit.    Allergies:   Bactrim [sulfamethoxazole-trimethoprim], Cephalexin, Sulfamethoxazole, and Amoxicillin   Social History:  The patient  reports that she has never smoked. She has never used smokeless tobacco. She reports that she does not drink alcohol and does not use drugs.   Family History:  The patient's  family history includes Alcohol abuse in her brother and brother; Arthritis in her brother; Colon polyps in her mother; Diabetes in her mother; Heart failure in her father; Hyperlipidemia in her father and mother; Lymphoma in her mother.    ROS:  Please see the history of present illness.   All other systems are personally reviewed and negative.    PHYSICAL EXAM: VS:  BP 138/74   Pulse 72   Ht 5' 8.5" (1.74 m)   Wt 194 lb (88 kg)   SpO2 94%   BMI 29.07 kg/m  , BMI Body mass index is 29.07 kg/m. GEN: Well nourished, well developed, in no acute distress HEENT: normal Neck: no JVD, carotid bruits, or masses Cardiac: RRR; no murmurs, rubs, or gallops, + mild edema  Respiratory:  clear to auscultation bilaterally, normal work of breathing GI: soft, nontender, nondistended, + BS MS: no deformity or atrophy Skin: warm and dry  Neuro:  Strength and sensation are intact Psych: euthymic mood, full affect  EKG:  EKG is ordered today. The ekg ordered today is personally reviewed and shows sinus rhythm, no ischemic changes   Recent Labs: No results found for requested labs within last 8760 hours.  personally reviewed   Lipid Panel     Component Value Date/Time   CHOL 173 11/15/2019 0919   TRIG 152.0 (H) 11/15/2019 0919   HDL 45.80 11/15/2019 0919   CHOLHDL 4 11/15/2019 0919   VLDL 30.4 11/15/2019 0919   LDLCALC 96 11/15/2019 0919   LDLDIRECT 171.0 10/10/2016 0802   personally reviewed   Wt Readings from Last 3 Encounters:  02/13/21 194 lb (88 kg)  04/24/20 189 lb 6.4 oz (85.9 kg)  10/24/19 193 lb (87.5  kg)     Other studies personally reviewed: Additional studies/ records that were reviewed today include: prior office notes  Review of the above records today demonstrates: as above   ASSESSMENT AND PLAN:  1.  SOB/ fatigue Unclear etiology Echo and myoview are ordered today.  Risks of myoview discussed Reduce coreg to 12.5mg  BID Regular activity/ weight reduction advised if her workup is negative  2. SVT Well controlled with coreg  3. HTN Stable No change required today   Follow-up:  Return to see EP APP annually  Current  medicines are reviewed at length with the patient today.   The patient does not have concerns regarding her medicines.  The following changes were made today:  none  Labs/ tests ordered today include:  No orders of the defined types were placed in this encounter.    Army Fossa, MD  02/13/2021 9:20 AM     [facility] [address] [place] [address] [phone] (office) [phone] (fax)

## 2021-02-13 NOTE — Patient Instructions (Addendum)
Medication Instructions:  Reduce your Carvedilol to 12.5 mg two times a day Your physician recommends that you continue on your current medications as directed. Please refer to the Current Medication list given to you today.  Labwork: None ordered.  Testing/Procedures: Your physician has requested that you have an echocardiogram. Echocardiography is a painless test that uses sound waves to create images of your heart. It provides your doctor with information about the size and shape of your heart and how well your heart's chambers and valves are working. This procedure takes approximately one hour. There are no restrictions for this procedure.   Follow-Up: Your physician wants you to follow-up in: one year with Holly Grayer, MD or one of the following Advanced Practice Providers on your designated Care Team:      Holly Standard, PA-C     You will receive a reminder letter in the mail two months in advance. If you don't receive a letter, please call our office to schedule the follow-up appointment.  Any Other Special Instructions Will Be Listed Below (If Applicable).  If you need a refill on your cardiac medications before your next appointment, please call your pharmacy.

## 2021-02-20 ENCOUNTER — Telehealth (HOSPITAL_COMMUNITY): Payer: Self-pay

## 2021-02-20 NOTE — Telephone Encounter (Signed)
Spoke with the patient, detailed instructions given. She stated that she understood and would be here for her test. Asked to call back with any questions. Holly Beasley EMTP 

## 2021-02-21 ENCOUNTER — Ambulatory Visit (HOSPITAL_COMMUNITY): Payer: 59 | Attending: Cardiology

## 2021-02-21 ENCOUNTER — Other Ambulatory Visit: Payer: Self-pay

## 2021-02-21 DIAGNOSIS — I471 Supraventricular tachycardia: Secondary | ICD-10-CM | POA: Diagnosis present

## 2021-02-21 DIAGNOSIS — R0602 Shortness of breath: Secondary | ICD-10-CM | POA: Insufficient documentation

## 2021-02-21 DIAGNOSIS — I10 Essential (primary) hypertension: Secondary | ICD-10-CM | POA: Insufficient documentation

## 2021-02-21 DIAGNOSIS — I872 Venous insufficiency (chronic) (peripheral): Secondary | ICD-10-CM | POA: Diagnosis present

## 2021-02-21 LAB — MYOCARDIAL PERFUSION IMAGING
LV dias vol: 66 mL (ref 46–106)
LV sys vol: 25 mL
Peak HR: 83 {beats}/min
Rest HR: 66 {beats}/min
SDS: 4
SRS: 0
SSS: 4
TID: 1.09

## 2021-02-21 MED ORDER — TECHNETIUM TC 99M TETROFOSMIN IV KIT
10.8000 | PACK | Freq: Once | INTRAVENOUS | Status: AC | PRN
  Administered 2021-02-21: 10.8 via INTRAVENOUS
  Filled 2021-02-21: qty 11

## 2021-02-21 MED ORDER — TECHNETIUM TC 99M TETROFOSMIN IV KIT
31.4000 | PACK | Freq: Once | INTRAVENOUS | Status: AC | PRN
Start: 2021-02-21 — End: 2021-02-21
  Administered 2021-02-21: 31.4 via INTRAVENOUS
  Filled 2021-02-21: qty 32

## 2021-02-21 MED ORDER — REGADENOSON 0.4 MG/5ML IV SOLN
0.4000 mg | Freq: Once | INTRAVENOUS | Status: AC
  Administered 2021-02-21: 0.4 mg via INTRAVENOUS

## 2021-03-05 ENCOUNTER — Other Ambulatory Visit: Payer: Self-pay | Admitting: Internal Medicine

## 2021-03-14 ENCOUNTER — Other Ambulatory Visit: Payer: Self-pay

## 2021-03-14 ENCOUNTER — Ambulatory Visit (HOSPITAL_COMMUNITY): Payer: 59 | Attending: Cardiology

## 2021-03-14 DIAGNOSIS — R0602 Shortness of breath: Secondary | ICD-10-CM | POA: Insufficient documentation

## 2021-03-14 DIAGNOSIS — I10 Essential (primary) hypertension: Secondary | ICD-10-CM | POA: Insufficient documentation

## 2021-03-14 DIAGNOSIS — I471 Supraventricular tachycardia: Secondary | ICD-10-CM | POA: Diagnosis present

## 2021-03-14 DIAGNOSIS — I872 Venous insufficiency (chronic) (peripheral): Secondary | ICD-10-CM | POA: Insufficient documentation

## 2021-03-14 LAB — ECHOCARDIOGRAM COMPLETE
Area-P 1/2: 3.08 cm2
S' Lateral: 2.2 cm

## 2021-04-29 NOTE — Progress Notes (Signed)
Chief Complaint  Patient presents with   Annual Exam     HPI: Holly Beasley 70 y.o. comes in today for Preventive Medicare exam/ wellness visit .Since last visit. Had noticed insidious onset of shortness of breath that she describes getting tired doing activities such as Vacuuming   lifting household items sometimes walking .  States she had evaluation by cardiology with a chemical stress test and EKG was normal. No coughing fever unintentional weight loss.  Hair shedding in past year.  [place] if thyroid could be off. Father  age 5 has some heair .  Mood is somewhat flatter has been on Cymbalta for well wonders if needs a change no full hopelessness but not looking forward many things happening externally. Sees dermatologist has a new creams to put on feet some improvement.  [place] Maintenance  Topic Date Due   FOOT EXAM  10/23/2020   HEMOGLOBIN A1C  10/25/2020   COVID-19 Vaccine (4 - Booster for Pfizer series) 04/18/2021   INFLUENZA VACCINE  05/13/2021   MAMMOGRAM  07/05/2021   OPHTHALMOLOGY EXAM  12/25/2021   COLONOSCOPY (Pts 45-65yrs Insurance coverage will need to be confirmed)  05/17/2022   TETANUS/TDAP  10/23/2029   DEXA SCAN  Completed   Hepatitis C Screening  Completed   PNA vac Low Risk Adult  Completed   Zoster Vaccines- Shingrix  Completed   HPV VACCINES  Aged Out   Health Maintenance Review LIFESTYLE:  Exercise:  Physiological scientist . Tobacco/ETS:n Alcohol: n Sugar beverages: No Sleep:  6  + naps  Drug use: no HH:  2  2 cats      Hearing: Okay Vision:  No limitations at present . Last eye check UTD cataract surgerye. Memory: Felt to be good  , no concern from her or her family. Depression: No anhedonia uon cymbalta    not as hoepful.  Nutrition: Eats well balanced diet; adequate calcium and vitamin D. No swallowing chewing problems. Injury: no major injuries in the last six months. Other healthcare providers:  Reviewed today . Preventive  parameters: u Reviewed  ADLS:   There are no problems or need for assistance  driving, feeding, obtaining food, dressing, toileting and bathing, managing money using phone. She is independent.    REST of 12 system review negative except as per HPI   Past Medical History:  Diagnosis Date   Depression    Diabetes mellitus without complication (HCC)    GERD (gastroesophageal reflux disease)    History of ETOH abuse    recovering  in remission   Hyperlipidemia    Hypertension    Osteoarthritis    Osteopenia    dexa -1.7 h -1.35    Scoliosis    leg length discrepancy   Skin cancer    basal cell nose   Sleep apnea    did not meet criteria for cpap-has mouthpiece not using per pt   SVT (supraventricular tachycardia) ([place]) 08/15/2016   Varicose veins    Wears contact lenses   Remote history of endometrial biopsy for postmenopausal bleeding was not cancer had no follow-up with GYN  Family History  Problem Relation Age of Onset   Lymphoma Mother    Diabetes Mother    Hyperlipidemia Mother    Colon polyps Mother    Heart failure Father    Hyperlipidemia Father    Arthritis Brother    Alcohol abuse Brother    Alcohol abuse Brother    Colon cancer Neg  Hx    Esophageal cancer Neg Hx    Rectal cancer Neg Hx    Stomach cancer Neg Hx     Social History   Socioeconomic History   Marital status: Married    Spouse name: Not on file   Number of children: Not on file   Years of education: Not on file   Highest education level: Not on file  Occupational History   Not on file  Tobacco Use   Smoking status: Never   Smokeless tobacco: Never  Vaping Use   Vaping Use: Never used  Substance and Sexual Activity   Alcohol use: No    Alcohol/week: 0.0 standard drinks    Comment: recovering-1983   Drug use: No   Sexual activity: Not on file  Other Topics Concern   Not on file  Social History Narrative   2 cats   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Educational psychologist   Married   ETOH recovering   [place] 2    Has family in [place]         Social Determinants of Health   Financial Resource Strain: Not on file  Food Insecurity: Not on file  Transportation Needs: Not on file  Physical Activity: Not on file  Stress: Not on file  Social Connections: Not on file    Outpatient Encounter Medications as of 04/30/2021  Medication Sig   atorvastatin (LIPITOR) 40 MG tablet TAKE 1 TABLET ONCE DAILY **NEEDS APPOINTMENT FOR FUTURE REFILLS**   augmented betamethasone dipropionate (DIPROLENE-AF) 0.05 % ointment Apply 1 application topically daily.   azelastine (ASTELIN) 0.1 % nasal spray INSTILL 1 2 SPRAYS INTO EACH NOSTRIL TWICE A DAY   carvedilol (COREG) 12.5 MG tablet Take 1 tablet (12.5 mg total) by mouth 2 (two) times daily.   cetirizine (ZYRTEC) 10 MG tablet Take 1 tablet by mouth as needed for allergies.   Cyanocobalamin (B-12) 2000 MCG TABS Take by mouth daily.   cycloSPORINE (RESTASIS) 0.05 % ophthalmic emulsion Place 1 drop into both eyes at bedtime.   DULoxetine (CYMBALTA) 60 MG capsule TAKE 2 CAPSULES BY MOUTH AT BEDTIME   EPINEPHrine 0.3 mg/0.3 mL IJ SOAJ injection USE AS DIRECTED AS NEEDED   HYDROcodone-acetaminophen (NORCO) 10-325 MG tablet Take 1 tablet by mouth daily as needed.   levocetirizine (XYZAL) 5 MG tablet TAKE 1 TABLET BY MOUTH EVERY DAY IN THE EVENING   losartan (COZAAR) 100 MG tablet TAKE 1 TABLET BY MOUTH EVERY DAY   Magnesium 400 MG TABS Take 400 mg by mouth 2 (two) times daily.   metFORMIN (GLUCOPHAGE) 500 MG tablet TAKE 4 TABLETS BY MOUTH EVERY DAY WITH A MEAL   montelukast (SINGULAIR) 10 MG tablet TAKE 1 TABLET BY MOUTH EVERYDAY AT BEDTIME   omeprazole (PRILOSEC) 40 MG capsule TAKE 1 CAPSULE BY MOUTH EVERY DAY   pregabalin (LYRICA) 50 MG capsule TAKE 2 CAPSULES BY MOUTH EVERY MORNING AND 3 CAPS AT BEDTIME   [DISCONTINUED] acitretin (SORIATANE) 25 MG capsule Take 25 mg by  mouth daily.   No facility-administered encounter medications on file as of 04/30/2021.    EXAM:  BP 124/70 (BP Location: Left Arm, Patient Position: Sitting, Cuff Size: Normal)   Pulse 73   Temp 97.8 F (36.6 C) (Oral)   Ht 5' 8.5" (1.74 m)   Wt 188 lb 9.6 oz (85.5 kg)   SpO2 96%   BMI 28.26 kg/m  Body mass index is 28.26 kg/m.  Physical Exam: Vital signs reviewed UYQ:IHKV is a well-developed well-nourished alert cooperative   who appears stated age in no acute distress.  HEENT: normocephalic atraumatic , Eyes: PERRL EOM's full, conjunctiva clear, Nares: paten,t no deformity discharge or tenderness., Ears: no deformity EAC's clear TMs with normal landmarks. Mouth:masked  NECK: supple without masses, thyromegaly or bruits. CHEST/PULM:  Clear to auscultation and percussion breath sounds equal no wheeze , rales or rhonchi. No chest wall deformities or tenderness.  Breast no nodules or discharge CV: PMI is nondisplaced, S1 S2 no gallops, murmurs, rubs. Peripheral pulses are full without delay.No JVD .  ABDOMEN: Bowel sounds normal nontender  No guard or rebound, no hepato splenomegal no CVA tenderness.   Extremtities:  No clubbing cyanosis or edema, no acute joint swelling or redness no focal atrophy NEURO:  Oriented x3, cranial nerves 3-12 appear to be intact, no obvious focal weakness,gait within normal limits no abnormal reflexes or asymmetrical SKIN: No acute rashes normal turgor, color, no bruising or petechiae.  Thickened toenails and some scaling on feet but no ulcers or other lesions PSYCH: Oriented, good eye contact, no obvious depression anxiety, cognition and judgment appear normal. LN: no cervical axillary inguinal adenopathy No noted deficits in memory, attention, and speech. Diabetic Foot Exam - Simple   Simple Foot Form Visual Inspection No deformities, no ulcerations, no other skin breakdown bilaterally: Yes See comments: Yes Sensation Testing Intact to touch and  monofilament testing bilaterally: Yes Pulse Check Posterior Tibialis and Dorsalis pulse intact bilaterally: Yes Comments Foot dermatitis callus right MTP sole no ulcers        Lab Results  Component Value Date   WBC 6.7 11/15/2019   HGB 12.2 11/15/2019   HCT 35.9 (L) 11/15/2019   PLT 286.0 11/15/2019   GLUCOSE 138 (H) 11/15/2019   CHOL 173 11/15/2019   TRIG 152.0 (H) 11/15/2019   HDL 45.80 11/15/2019   LDLDIRECT 171.0 10/10/2016   LDLCALC 96 11/15/2019   ALT 20 11/15/2019   AST 16 11/15/2019   NA 140 11/15/2019   K 4.0 11/15/2019   CL 104 11/15/2019   CREATININE 0.75 11/15/2019   BUN 10 11/15/2019   CO2 27 11/15/2019   TSH 1.77 11/15/2019   HGBA1C 7.1 (A) 04/24/2020   MICROALBUR <0.7 11/15/2019    ASSESSMENT AND PLAN:  Discussed the following assessment and plan:  Visit for preventive health examination  Medication management - Plan: Basic metabolic panel, CBC with Differential/Platelet, Hemoglobin A1c, Hepatic function panel, Lipid panel, TSH, T4, free, Microalbumin / creatinine urine ratio, Microalbumin / creatinine urine ratio, T4, free, TSH, Lipid panel, Hepatic function panel, Hemoglobin A1c, CBC with Differential/Platelet, Basic metabolic panel  Essential hypertension - Plan: Basic metabolic panel, CBC with Differential/Platelet, Hemoglobin A1c, Hepatic function panel, Lipid panel, TSH, T4, free, Microalbumin / creatinine urine ratio, Microalbumin / creatinine urine ratio, T4, free, TSH, Lipid panel, Hepatic function panel, Hemoglobin A1c, CBC with Differential/Platelet, Basic metabolic panel  Hyperlipidemia, unspecified hyperlipidemia type - Plan: Basic metabolic panel, CBC with Differential/Platelet, Hemoglobin A1c, Hepatic function panel, Lipid panel, TSH, T4, free, Microalbumin / creatinine urine ratio, Microalbumin / creatinine urine ratio, T4, free, TSH, Lipid panel, Hepatic function panel, Hemoglobin A1c, CBC with Differential/Platelet, Basic metabolic  panel  Dyspnea, unspecified type - Plan: Basic metabolic panel, CBC with Differential/Platelet, Hemoglobin A1c, Hepatic function panel, Lipid panel, TSH, T4, free, Microalbumin / creatinine urine ratio, Microalbumin / creatinine urine ratio, T4, free, TSH, Lipid panel, Hepatic  function panel, Hemoglobin A1c, CBC with Differential/Platelet, Basic metabolic panel  Hypertension associated with diabetes ([place]) - Plan: Basic metabolic panel, CBC with Differential/Platelet, Hemoglobin A1c, Hepatic function panel, Lipid panel, TSH, T4, free, Microalbumin / creatinine urine ratio, Microalbumin / creatinine urine ratio, T4, free, TSH, Lipid panel, Hepatic function panel, Hemoglobin A1c, CBC with Differential/Platelet, Basic metabolic panel  Controlled type 2 diabetes mellitus with complication, without long-term current use of insulin (HCC) - Plan: Basic metabolic panel, CBC with Differential/Platelet, Hemoglobin A1c, Hepatic function panel, Lipid panel, TSH, T4, free, Microalbumin / creatinine urine ratio, Microalbumin / creatinine urine ratio, T4, free, TSH, Lipid panel, Hepatic function panel, Hemoglobin A1c, CBC with Differential/Platelet, Basic metabolic panel Lab  monitoring  Uncertain cause of what she considers shortness of breath exam is reassuring no obvious pulmonary cause had cardiology eval Labs today go from there. See AVS advice instructions. Patient Care Team: Kamisha Ell, Standley Brooking, MD as PCP - General Camillo Flaming, [place] as Referring Physician (Optometry) Rolm Bookbinder, MD as Consulting Physician (Dermatology) Norma Fredrickson, MD as Consulting Physician (Psychiatry) Thompson Grayer, MD as Consulting Physician (Cardiology) Starling Manns, MD (Orthopedic Surgery)  Patient Instructions  Exam  is reassuring  and no obv cause of shortness of breath.   Checking for anemia and  thyroid as well as   blood sugar .  Consider  talking with counselor  as well as  prescriber or Cymbalta .   Will plan follow up  depending on  results and how  you are doing.   Get your mammogram.   Health Maintenance, Female Adopting a healthy lifestyle and getting preventive care are important in promoting health and wellness. Ask your health care provider about: The right schedule for you to have regular tests and exams. Things you can do on your own to prevent diseases and keep yourself healthy. What should I know about diet, weight, and exercise? Eat a healthy diet  Eat a diet that includes plenty of vegetables, fruits, low-fat dairy products, and lean protein. Do not eat a lot of foods that are high in solid fats, added sugars, or sodium.  Maintain a healthy weight Body mass index (BMI) is used to identify weight problems. It estimates body fat based on height and weight. Your health care provider can help determineyour BMI and help you achieve or maintain a healthy weight. Get regular exercise Get regular exercise. This is one of the most important things you can do for your health. Most adults should: Exercise for at least 150 minutes each week. The exercise should increase your heart rate and make you sweat (moderate-intensity exercise). Do strengthening exercises at least twice a week. This is in addition to the moderate-intensity exercise. Spend less time sitting. Even light physical activity can be beneficial. Watch cholesterol and blood lipids Have your blood tested for lipids and cholesterol at 70 years of age, then havethis test every 5 years. Have your cholesterol levels checked more often if: Your lipid or cholesterol levels are high. You are older than 70 years of age. You are at high risk for heart disease. What should I know about cancer screening? Depending on your health history and family history, you may need to have cancer screening at various ages. This may include screening for: Breast cancer. Cervical cancer. Colorectal cancer. Skin cancer. Lung cancer. What should I know about  heart disease, diabetes, and high blood pressure? Blood pressure and heart disease High blood pressure causes heart disease and increases the risk of stroke.  This is more likely to develop in people who have high blood pressure readings, are of African descent, or are overweight. Have your blood pressure checked: Every 3-5 years if you are 31-22 years of age. Every year if you are 54 years old or older. Diabetes Have regular diabetes screenings. This checks your fasting blood sugar level. Have the screening done: Once every three years after age 74 if you are at a normal weight and have a low risk for diabetes. More often and at a younger age if you are overweight or have a high risk for diabetes. What should I know about preventing infection? Hepatitis B If you have a higher risk for hepatitis B, you should be screened for this virus. Talk with your health care provider to find out if you are at risk forhepatitis B infection. Hepatitis C Testing is recommended for: Everyone born from [birth year] through 1965. Anyone with known risk factors for hepatitis C. Sexually transmitted infections (STIs) Get screened for STIs, including gonorrhea and chlamydia, if: You are sexually active and are younger than 70 years of age. You are older than 70 years of age and your health care provider tells you that you are at risk for this type of infection. Your sexual activity has changed since you were last screened, and you are at increased risk for chlamydia or gonorrhea. Ask your health care provider if you are at risk. Ask your health care provider about whether you are at high risk for HIV. Your health care provider may recommend a prescription medicine to help prevent HIV infection. If you choose to take medicine to prevent HIV, you should first get tested for HIV. You should then be tested every 3 months for as long as you are taking the medicine. Pregnancy If you are about to stop having your period  (premenopausal) and you may become pregnant, seek counseling before you get pregnant. Take 400 to 800 micrograms (mcg) of folic acid every day if you become pregnant. Ask for birth control (contraception) if you want to prevent pregnancy. Osteoporosis and menopause Osteoporosis is a disease in which the bones lose minerals and strength with aging. This can result in bone fractures. If you are 78 years old or older, or if you are at risk for osteoporosis and fractures, ask your health care provider if you should: Be screened for bone loss. Take a calcium or vitamin D supplement to lower your risk of fractures. Be given hormone replacement therapy (HRT) to treat symptoms of menopause. Follow these instructions at home: Lifestyle Do not use any products that contain nicotine or tobacco, such as cigarettes, e-cigarettes, and chewing tobacco. If you need help quitting, ask your health care provider. Do not use street drugs. Do not share needles. Ask your health care provider for help if you need support or information about quitting drugs. Alcohol use Do not drink alcohol if: Your health care provider tells you not to drink. You are pregnant, may be pregnant, or are planning to become pregnant. If you drink alcohol: Limit how much you use to 0-1 drink a day. Limit intake if you are breastfeeding. Be aware of how much alcohol is in your drink. In the [place] one drink equals one 12 oz bottle of beer (355 mL), one 5 oz glass of wine (148 mL), or one 1 oz glass of hard liquor (44 mL). General instructions Schedule regular health, dental, and eye exams. Stay current with your vaccines. Tell your health care provider if: You  often feel depressed. You have ever been abused or do not feel safe at home. Summary Adopting a healthy lifestyle and getting preventive care are important in promoting health and wellness. Follow your health care provider's instructions about healthy diet, exercising, and  getting tested or screened for diseases. Follow your health care provider's instructions on monitoring your cholesterol and blood pressure. This information is not intended to replace advice given to you by your health care provider. Make sure you discuss any questions you have with your healthcare provider. Document Revised: 09/22/2018 Document Reviewed: 09/22/2018 Elsevier Patient Education  2022 [place]. Hafiz Irion M.D.

## 2021-04-30 ENCOUNTER — Encounter: Payer: Self-pay | Admitting: Internal Medicine

## 2021-04-30 ENCOUNTER — Other Ambulatory Visit: Payer: Self-pay

## 2021-04-30 ENCOUNTER — Ambulatory Visit (INDEPENDENT_AMBULATORY_CARE_PROVIDER_SITE_OTHER): Payer: 59 | Admitting: Internal Medicine

## 2021-04-30 VITALS — BP 124/70 | HR 73 | Temp 97.8°F | Ht 68.5 in | Wt 188.6 lb

## 2021-04-30 DIAGNOSIS — Z0000 Encounter for general adult medical examination without abnormal findings: Secondary | ICD-10-CM

## 2021-04-30 DIAGNOSIS — I10 Essential (primary) hypertension: Secondary | ICD-10-CM

## 2021-04-30 DIAGNOSIS — E785 Hyperlipidemia, unspecified: Secondary | ICD-10-CM

## 2021-04-30 DIAGNOSIS — Z79899 Other long term (current) drug therapy: Secondary | ICD-10-CM

## 2021-04-30 DIAGNOSIS — E118 Type 2 diabetes mellitus with unspecified complications: Secondary | ICD-10-CM

## 2021-04-30 DIAGNOSIS — E1159 Type 2 diabetes mellitus with other circulatory complications: Secondary | ICD-10-CM

## 2021-04-30 DIAGNOSIS — R0600 Dyspnea, unspecified: Secondary | ICD-10-CM

## 2021-04-30 DIAGNOSIS — I152 Hypertension secondary to endocrine disorders: Secondary | ICD-10-CM

## 2021-04-30 LAB — TSH: TSH: 2.16 u[IU]/mL (ref 0.35–5.50)

## 2021-04-30 LAB — HEPATIC FUNCTION PANEL
ALT: 19 U/L (ref 0–35)
AST: 19 U/L (ref 0–37)
Albumin: 4.2 g/dL (ref 3.5–5.2)
Alkaline Phosphatase: 71 U/L (ref 39–117)
Bilirubin, Direct: 0.1 mg/dL (ref 0.0–0.3)
Total Bilirubin: 0.7 mg/dL (ref 0.2–1.2)
Total Protein: 6.8 g/dL (ref 6.0–8.3)

## 2021-04-30 LAB — BASIC METABOLIC PANEL
BUN: 12 mg/dL (ref 6–23)
CO2: 26 mEq/L (ref 19–32)
Calcium: 9.3 mg/dL (ref 8.4–10.5)
Chloride: 103 mEq/L (ref 96–112)
Creatinine, Ser: 0.85 mg/dL (ref 0.40–1.20)
GFR: 69.54 mL/min (ref >60.00)
Glucose, Bld: 183 mg/dL — ABNORMAL HIGH (ref 70–99)
Potassium: 4.3 mEq/L (ref 3.5–5.1)
Sodium: 137 mEq/L (ref 135–145)

## 2021-04-30 LAB — CBC WITH DIFFERENTIAL/PLATELET
Basophils Absolute: 0.1 10*3/uL (ref 0.0–0.1)
Basophils Relative: 1 % (ref 0.0–3.0)
Eosinophils Absolute: 0.6 10*3/uL (ref 0.0–0.7)
Eosinophils Relative: 8.2 % — ABNORMAL HIGH (ref 0.0–5.0)
HCT: 35.2 % — ABNORMAL LOW (ref 36.0–46.0)
Hemoglobin: 12 g/dL (ref 12.0–15.0)
Lymphocytes Relative: 48 % — ABNORMAL HIGH (ref 12.0–46.0)
Lymphs Abs: 3.4 10*3/uL (ref 0.7–4.0)
MCHC: 34.1 g/dL (ref 30.0–36.0)
MCV: 87.3 fl (ref 78.0–100.0)
Monocytes Absolute: 0.6 10*3/uL (ref 0.1–1.0)
Monocytes Relative: 8.3 % (ref 3.0–12.0)
Neutro Abs: 2.5 10*3/uL (ref 1.4–7.7)
Neutrophils Relative %: 34.5 % — ABNORMAL LOW (ref 43.0–77.0)
Platelets: 305 10*3/uL (ref 150.0–400.0)
RBC: 4.03 Mil/uL (ref 3.87–5.11)
RDW: 14.5 % (ref 11.5–15.5)
WBC: 7.1 10*3/uL (ref 4.0–10.5)

## 2021-04-30 LAB — LIPID PANEL
Cholesterol: 183 mg/dL (ref 0–200)
HDL: 48.8 mg/dL (ref >39.00)
LDL Cholesterol: 96 mg/dL (ref 0–99)
NonHDL: 134.05
Total CHOL/HDL Ratio: 4
Triglycerides: 191 mg/dL — ABNORMAL HIGH (ref 0.0–149.0)
VLDL: 38.2 mg/dL (ref 0.0–40.0)

## 2021-04-30 LAB — MICROALBUMIN / CREATININE URINE RATIO
Creatinine,U: 61.2 mg/dL
Microalb Creat Ratio: 1.1 mg/g (ref 0.0–30.0)
Microalb, Ur: <0.7 mg/dL (ref 0.0–1.9)

## 2021-04-30 LAB — T4, FREE: Free T4: 0.73 ng/dL (ref 0.60–1.60)

## 2021-04-30 LAB — HEMOGLOBIN A1C: Hgb A1c MFr Bld: 8.4 % — ABNORMAL HIGH (ref 4.6–6.5)

## 2021-04-30 NOTE — Patient Instructions (Signed)
Exam  is reassuring  and no obv cause of shortness of breath.   Checking for anemia and  thyroid as well as   blood sugar .  Consider  talking with counselor  as well as  prescriber or Cymbalta .   Will plan follow up depending on  results and how  you are doing.   Get your mammogram.   Health Maintenance, Female Adopting a healthy lifestyle and getting preventive care are important in promoting health and wellness. Ask your health care provider about: The right schedule for you to have regular tests and exams. Things you can do on your own to prevent diseases and keep yourself healthy. What should I know about diet, weight, and exercise? Eat a healthy diet  Eat a diet that includes plenty of vegetables, fruits, low-fat dairy products, and lean protein. Do not eat a lot of foods that are high in solid fats, added sugars, or sodium.  Maintain a healthy weight Body mass index (BMI) is used to identify weight problems. It estimates body fat based on height and weight. Your health care provider can help determineyour BMI and help you achieve or maintain a healthy weight. Get regular exercise Get regular exercise. This is one of the most important things you can do for your health. Most adults should: Exercise for at least 150 minutes each week. The exercise should increase your heart rate and make you sweat (moderate-intensity exercise). Do strengthening exercises at least twice a week. This is in addition to the moderate-intensity exercise. Spend less time sitting. Even light physical activity can be beneficial. Watch cholesterol and blood lipids Have your blood tested for lipids and cholesterol at 70 years of age, then havethis test every 5 years. Have your cholesterol levels checked more often if: Your lipid or cholesterol levels are high. You are older than 70 years of age. You are at high risk for heart disease. What should I know about cancer screening? Depending on your health  history and family history, you may need to have cancer screening at various ages. This may include screening for: Breast cancer. Cervical cancer. Colorectal cancer. Skin cancer. Lung cancer. What should I know about heart disease, diabetes, and high blood pressure? Blood pressure and heart disease High blood pressure causes heart disease and increases the risk of stroke. This is more likely to develop in people who have high blood pressure readings, are of African descent, or are overweight. Have your blood pressure checked: Every 3-5 years if you are 70-20 years of age. Every year if you are 65 years old or older. Diabetes Have regular diabetes screenings. This checks your fasting blood sugar level. Have the screening done: Once every three years after age 70 if you are at a normal weight and have a low risk for diabetes. More often and at a younger age if you are overweight or have a high risk for diabetes. What should I know about preventing infection? Hepatitis B If you have a higher risk for hepatitis B, you should be screened for this virus. Talk with your health care provider to find out if you are at risk forhepatitis B infection. Hepatitis C Testing is recommended for: Everyone born from [birth year] through 1965. Anyone with known risk factors for hepatitis C. Sexually transmitted infections (STIs) Get screened for STIs, including gonorrhea and chlamydia, if: You are sexually active and are younger than 70 years of age. You are older than 70 years of age and your health care provider  tells you that you are at risk for this type of infection. Your sexual activity has changed since you were last screened, and you are at increased risk for chlamydia or gonorrhea. Ask your health care provider if you are at risk. Ask your health care provider about whether you are at high risk for HIV. Your health care provider may recommend a prescription medicine to help prevent HIV infection. If you  choose to take medicine to prevent HIV, you should first get tested for HIV. You should then be tested every 3 months for as long as you are taking the medicine. Pregnancy If you are about to stop having your period (premenopausal) and you may become pregnant, seek counseling before you get pregnant. Take 400 to 800 micrograms (mcg) of folic acid every day if you become pregnant. Ask for birth control (contraception) if you want to prevent pregnancy. Osteoporosis and menopause Osteoporosis is a disease in which the bones lose minerals and strength with aging. This can result in bone fractures. If you are 44 years old or older, or if you are at risk for osteoporosis and fractures, ask your health care provider if you should: Be screened for bone loss. Take a calcium or vitamin D supplement to lower your risk of fractures. Be given hormone replacement therapy (HRT) to treat symptoms of menopause. Follow these instructions at home: Lifestyle Do not use any products that contain nicotine or tobacco, such as cigarettes, e-cigarettes, and chewing tobacco. If you need help quitting, ask your health care provider. Do not use street drugs. Do not share needles. Ask your health care provider for help if you need support or information about quitting drugs. Alcohol use Do not drink alcohol if: Your health care provider tells you not to drink. You are pregnant, may be pregnant, or are planning to become pregnant. If you drink alcohol: Limit how much you use to 0-1 drink a day. Limit intake if you are breastfeeding. Be aware of how much alcohol is in your drink. In the [place] one drink equals one 12 oz bottle of beer (355 mL), one 5 oz glass of wine (148 mL), or one 1 oz glass of hard liquor (44 mL). General instructions Schedule regular health, dental, and eye exams. Stay current with your vaccines. Tell your health care provider if: You often feel depressed. You have ever been abused or do not feel  safe at home. Summary Adopting a healthy lifestyle and getting preventive care are important in promoting health and wellness. Follow your health care provider's instructions about healthy diet, exercising, and getting tested or screened for diseases. Follow your health care provider's instructions on monitoring your cholesterol and blood pressure. This information is not intended to replace advice given to you by your health care provider. Make sure you discuss any questions you have with your healthcare provider. Document Revised: 09/22/2018 Document Reviewed: 09/22/2018 Elsevier Patient Education  2022 [employer].

## 2021-05-06 NOTE — Progress Notes (Signed)
Hemoglobin A1c is up to 8.4 which is not at goal.  Tri Glycerides slightly up which is concordant with A1c level results are normal or stable. Addition to your attention to healthy eating and activity lifestyle We can add medication.   Suggest we send in [place] 5 mg per day .  This medicine helps you urinate out glucose.  And is good for blood sugar control and avoiding of heart failure.  If not covered by insurance we can try a different one in the class.  Advise make virtual visit ( or in person if you prefer.)  to discuss  and answer questions.  Otherwise we can send in new medication to your pharmacy and plan fu  visit in 3 months

## 2021-05-19 ENCOUNTER — Other Ambulatory Visit: Payer: Self-pay | Admitting: Internal Medicine

## 2021-06-03 MED ORDER — DAPAGLIFLOZIN PROPANEDIOL 5 MG PO TABS: 5.0000 mg | ORAL_TABLET | Freq: Every day | ORAL | 3 refills | Status: DC

## 2021-06-03 NOTE — Addendum Note (Signed)
Addended by: Nilda Riggs on: 06/03/2021 02:56 PM   Modules accepted: Orders

## 2021-06-15 ENCOUNTER — Other Ambulatory Visit: Payer: Self-pay | Admitting: Internal Medicine

## 2021-06-25 ENCOUNTER — Other Ambulatory Visit: Payer: Self-pay | Admitting: Internal Medicine

## 2021-08-12 NOTE — Progress Notes (Signed)
Chief Complaint  Patient presents with   Follow-up    HPI: Holly Beasley 70 y.o. come in for Chronic disease management  Last cpx  July   fu sob   doing better.  Got COVID 1 went to a wedding in [place] did not take pack Slo-Bid to get a sinus infection was put on antibiotic and is now pretty much better but lost some time with fatigue but has cut out excess sugars and begun Farxiga 5 mg a day No side effects feels well with it   Depression seems a little bit better   no new sx   ROS: See pertinent positives and negatives per HPI.  Past Medical History:  Diagnosis Date   Depression    Diabetes mellitus without complication (HCC)    GERD (gastroesophageal reflux disease)    History of ETOH abuse    recovering  in remission   Hyperlipidemia    Hypertension    Osteoarthritis    Osteopenia    dexa -1.7 h -1.35    Scoliosis    leg length discrepancy   Skin cancer    basal cell nose   Sleep apnea    did not meet criteria for cpap-has mouthpiece not using per pt   SVT (supraventricular tachycardia) ([place]) 08/15/2016   Varicose veins    Wears contact lenses     Family History  Problem Relation Age of Onset   Lymphoma Mother    Diabetes Mother    Hyperlipidemia Mother    Colon polyps Mother    Heart failure Father    Hyperlipidemia Father    Arthritis Brother    Alcohol abuse Brother    Alcohol abuse Brother    Colon cancer Neg Hx    Esophageal cancer Neg Hx    Rectal cancer Neg Hx    Stomach cancer Neg Hx     Social History   Socioeconomic History   Marital status: Married    Spouse name: Not on file   Number of children: Not on file   Years of education: Not on file   Highest education level: Not on file  Occupational History   Not on file  Tobacco Use   Smoking status: Never   Smokeless tobacco: Never  Vaping Use   Vaping Use: Never used  Substance and Sexual Activity   Alcohol use: No    Alcohol/week: 0.0 standard drinks    Comment:  recovering-1983   Drug use: No   Sexual activity: Not on file  Other Topics Concern   Not on file  Social History Narrative   2 cats   Musician  Play horn in many groups  [employer]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Educational psychologist   Married   ETOH recovering   [place] 2    Has family in [place]         Social Determinants of Health   Financial Resource Strain: Not on file  Food Insecurity: Not on file  Transportation Needs: Not on file  Physical Activity: Not on file  Stress: Not on file  Social Connections: Not on file    Outpatient Medications Prior to Visit  Medication Sig Dispense Refill   atorvastatin (LIPITOR) 40 MG tablet TAKE 1 TABLET ONCE DAILY **NEEDS APPOINTMENT FOR FUTURE REFILLS** 90 tablet 1   augmented betamethasone dipropionate (DIPROLENE-AF) 0.05 % ointment Apply 1 application topically daily.     azelastine (ASTELIN) 0.1 % nasal spray INSTILL  1 2 SPRAYS INTO EACH NOSTRIL TWICE A DAY     carvedilol (COREG) 12.5 MG tablet Take 1 tablet (12.5 mg total) by mouth 2 (two) times daily. 180 tablet 3   cetirizine (ZYRTEC) 10 MG tablet Take 1 tablet by mouth as needed for allergies.     Cyanocobalamin (B-12) 2000 MCG TABS Take by mouth daily.     cycloSPORINE (RESTASIS) 0.05 % ophthalmic emulsion Place 1 drop into both eyes at bedtime.     dapagliflozin propanediol (FARXIGA) 5 MG TABS tablet Take 1 tablet (5 mg total) by mouth daily before breakfast. 30 tablet 3   DULoxetine (CYMBALTA) 60 MG capsule TAKE 2 CAPSULES BY MOUTH AT BEDTIME 180 capsule 1   EPINEPHrine 0.3 mg/0.3 mL IJ SOAJ injection USE AS DIRECTED AS NEEDED     HYDROcodone-acetaminophen (NORCO) 10-325 MG tablet Take 1 tablet by mouth daily as needed.     levocetirizine (XYZAL) 5 MG tablet TAKE 1 TABLET BY MOUTH EVERY DAY IN THE EVENING     losartan (COZAAR) 100 MG tablet TAKE 1 TABLET BY MOUTH EVERY DAY 90 tablet 1   Magnesium 400 MG TABS Take 400 mg by mouth 2 (two) times daily.      metFORMIN (GLUCOPHAGE) 500 MG tablet TAKE 4 TABLETS BY MOUTH EVERY DAY WITH A MEAL 360 tablet 1   montelukast (SINGULAIR) 10 MG tablet TAKE 1 TABLET BY MOUTH EVERYDAY AT BEDTIME 90 tablet 1   omeprazole (PRILOSEC) 40 MG capsule TAKE 1 CAPSULE BY MOUTH EVERY DAY 90 capsule 1   pregabalin (LYRICA) 50 MG capsule TAKE 2 CAPSULES BY MOUTH EVERY MORNING AND 3 CAPS AT BEDTIME     No facility-administered medications prior to visit.     EXAM:  BP 134/66 (BP Location: Left Arm, Patient Position: Sitting, Cuff Size: Normal)   Pulse 71   Temp (!) 97.3 F (36.3 C) (Temporal)   Ht 5' 8.5" (1.74 m)   Wt 188 lb 12.8 oz (85.6 kg)   SpO2 94%   BMI 28.29 kg/m   Body mass index is 28.29 kg/m.  GENERAL: vitals reviewed and listed above, alert, oriented, appears well hydrated and in no acute distress HEENT: atraumatic, conjunctiva  clear, no obvious abnormalities on inspection of external nose and ears MS: moves all extremities without noticeable focal  abnormality PSYCH: pleasant and cooperative, no obvious depression or anxiety Lab Results  Component Value Date   WBC 7.1 04/30/2021   HGB 12.0 04/30/2021   HCT 35.2 (L) 04/30/2021   PLT 305.0 04/30/2021   GLUCOSE 183 (H) 04/30/2021   CHOL 183 04/30/2021   TRIG 191.0 (H) 04/30/2021   HDL 48.80 04/30/2021   LDLDIRECT 171.0 10/10/2016   LDLCALC 96 04/30/2021   ALT 19 04/30/2021   AST 19 04/30/2021   NA 137 04/30/2021   K 4.3 04/30/2021   CL 103 04/30/2021   CREATININE 0.85 04/30/2021   BUN 12 04/30/2021   CO2 26 04/30/2021   TSH 2.16 04/30/2021   HGBA1C 6.8 (A) 08/13/2021   MICROALBUR <0.7 04/30/2021   BP Readings from Last 3 Encounters:  08/13/21 134/66  04/30/21 124/70  02/13/21 138/74   Wt Readings from Last 3 Encounters:  08/13/21 188 lb 12.8 oz (85.6 kg)  04/30/21 188 lb 9.6 oz (85.5 kg)  02/21/21 194 lb (88 kg)    ASSESSMENT AND PLAN:  Discussed the following assessment and plan:  Type 2 diabetes mellitus with  hyperglycemia, without long-term current use of insulin ([place]) - Plan: POCT  glycosylated hemoglobin (Hb A1C)  History of COVID-19 - aug 2022 Hemoglobin A1c is much improved continue [place] lifestyle eating changes plan follow-up in 4 to 6 months or as needed.  -Patient advised to return or notify health care team  if  new concerns arise.  Patient Instructions  Good to see yo u today  Continue  attention to lifestyle intervention healthy eating and exercise .    Plan follow up. 4-6 mos  or as needed   Hg a1c is now  6.8  much better    Mariann Laster K. Sumire Halbleib M.D.

## 2021-08-13 ENCOUNTER — Ambulatory Visit: Payer: 59 | Admitting: Internal Medicine

## 2021-08-13 ENCOUNTER — Other Ambulatory Visit: Payer: Self-pay

## 2021-08-13 ENCOUNTER — Encounter: Payer: Self-pay | Admitting: Internal Medicine

## 2021-08-13 VITALS — BP 134/66 | HR 71 | Temp 97.3°F | Ht 68.5 in | Wt 188.8 lb

## 2021-08-13 DIAGNOSIS — Z8616 Personal history of COVID-19: Secondary | ICD-10-CM

## 2021-08-13 DIAGNOSIS — E1165 Type 2 diabetes mellitus with hyperglycemia: Secondary | ICD-10-CM

## 2021-08-13 LAB — POCT GLYCOSYLATED HEMOGLOBIN (HGB A1C): Hemoglobin A1C: 6.8 % — AB (ref 4.0–5.6)

## 2021-08-13 NOTE — Patient Instructions (Signed)
Good to see yo u today  Continue  attention to lifestyle intervention healthy eating and exercise .    Plan follow up. 4-6 mos  or as needed   Hg a1c is now  6.8  much better

## 2021-09-28 ENCOUNTER — Other Ambulatory Visit: Payer: Self-pay | Admitting: Internal Medicine

## 2021-11-20 ENCOUNTER — Other Ambulatory Visit: Payer: Self-pay | Admitting: Internal Medicine

## 2021-12-02 ENCOUNTER — Telehealth: Payer: Self-pay | Admitting: Internal Medicine

## 2021-12-02 NOTE — Telephone Encounter (Signed)
STAT if patient feels like he/she is going to faint   Are you dizzy now? Yes, a little  Do you feel faint or have you passed out? no  Do you have any other symptoms? dizziness, fatigue, extreme weakness, vertigo, brain fog, SOB when moving around  Have you checked your HR and BP (record if available)? no  Patient states she has been having dizziness, fatigue, extreme weakness, vertigo, brain fog, SOB when moving around. Patient was not SOB while on the phone. She states she has felt faint when standing after sitting for a while, but does not feel that way now. She says she is not sure if is it from when she had covid and also states she thinks it may be POTS. She says one day she was in bed and it felt like the room was spinning. She also states she did receive the letter about Dr. Rayann Heman leaving and would like a recommendation of which doctor she should establish with.

## 2021-12-02 NOTE — Telephone Encounter (Signed)
Pt calling in to report the she "has not been right since I had covid last August". She states new symptoms have started in the last 6 weeks. Reports that if she is sitting for awhile and then stands up she gets dizzy. She reports extreme fatigue, dizzy when ambulatory, sob and brain fog. States she was laying in the bed a week ago a had "full blown vertigo"  Says that she was researching and came across POTS and she has similar symptoms and would like to see MD to discuss this. Pt informed that she does NOT have POTS and we would not diagnose POTS in a 71 year old. Informed that symptoms she reported can certainly be related to her covid infection last year. Aware that she has no heart issues that would be related, and she is not having SVT. Aware routine follow up is with the EP PA in April and will have scheduled call to arrange. Advised to call PCP to further discuss symptoms/concerns. Aware they will let us know if they feel cardiology needs to get involved. Pt agreeable and will call PCP to further discuss

## 2021-12-04 ENCOUNTER — Other Ambulatory Visit: Payer: Self-pay | Admitting: Internal Medicine

## 2021-12-04 ENCOUNTER — Other Ambulatory Visit: Payer: Self-pay | Admitting: Orthopaedic Surgery

## 2021-12-04 DIAGNOSIS — M542 Cervicalgia: Secondary | ICD-10-CM

## 2021-12-06 ENCOUNTER — Other Ambulatory Visit: Payer: Self-pay

## 2021-12-06 ENCOUNTER — Encounter: Payer: Self-pay | Admitting: Physician Assistant

## 2021-12-06 ENCOUNTER — Ambulatory Visit (INDEPENDENT_AMBULATORY_CARE_PROVIDER_SITE_OTHER): Payer: 59 | Admitting: Physician Assistant

## 2021-12-06 VITALS — BP 110/72 | HR 76 | Ht 68.5 in | Wt 176.0 lb

## 2021-12-06 DIAGNOSIS — I471 Supraventricular tachycardia, unspecified: Secondary | ICD-10-CM

## 2021-12-06 DIAGNOSIS — I10 Essential (primary) hypertension: Secondary | ICD-10-CM

## 2021-12-06 DIAGNOSIS — I951 Orthostatic hypotension: Secondary | ICD-10-CM

## 2021-12-06 MED ORDER — LOSARTAN POTASSIUM 50 MG PO TABS: 50.0000 mg | ORAL_TABLET | Freq: Every day | ORAL | 1 refills | Status: DC

## 2021-12-06 NOTE — Patient Instructions (Addendum)
Medication Instructions:    START TAKING LOSARTAN 50 MG ONCE A DAY   *If you need a refill on your cardiac medications before your next appointment, please call your pharmacy*   Lab Work: NONE ORDERED  TODAY   If you have labs (blood work) drawn today and your tests are completely normal, you will receive your results only by: [place] (if you have MyChart) OR A paper copy in the mail If you have any lab test that is abnormal or we need to change your treatment, we will call you to review the results.   Testing/Procedures: NONE ORDERED  TODAY    Follow-Up: At [facility], you and your health needs are our priority.  As part of our continuing mission to provide you with exceptional heart care, we have created designated Provider Care Teams.  These Care Teams include your primary Cardiologist (physician) and Advanced Practice Providers (APPs -  Physician Assistants and Nurse Practitioners) who all work together to provide you with the care you need, when you need it.  We recommend signing up for the patient portal called "MyChart".  Sign up information is provided on this After Visit Summary.  MyChart is used to connect with patients for Virtual Visits (Telemedicine).  Patients are able to view lab/test results, encounter notes, upcoming appointments, etc.  Non-urgent messages can be sent to your provider as well.   To learn more about what you can do with MyChart, go to [email or web address].    Your next appointment:   2 -3 month(s)  The format for your next appointment:   In Person  Provider:   Tommye Standard, PA-C    Other Instructions   MAKE SURE YOUR KEEPING [place]

## 2021-12-06 NOTE — Progress Notes (Signed)
Cardiology Office Note Date:  12/06/2021  Patient ID:  Holly, Beasley [birth year], MRN [id number] PCP:  Burnis Medin, MD  Electrophysiologist: Dr. Rayann Heman    Chief Complaint:  dizziness  History of Present Illness: Holly Beasley is a 71 y.o. female with history of HTN, HLD, DM, GERD, SVT (short RP, adenosine sensitive).  She comes in today to be seen for Dr. Rayann Heman, last seen by him May 2022, at that time he mentions reports of progressive SOB and fatigue, chronic edema as stable, reduced activity 2/2 knee pain. Planned for echo, stress test, her coreg reduced She had not had any SVT  TTE with LVEF 60-65%, no WMA Stres test was normal, low risk  Pt called with months of symptoms since a COVID infection Aug 2022 Saw her PMD Nov 2011, COVID aug 2022, starting to feel better, less fatigue and SOB  TODAY She has number of symptoms/worries Orthostatic dizziness, not every time she stands up, but often, more so after sitting a long time Marked fatigue This goes back perhaps a year or so, worse after COVID but there before as well, a constant sense of having no energy 3.     Remains with brain fog after COVID 4.    Had an episode of room spinning vertigo in bed a couple nights ago 5.    B/l hand numbness that comes/goes She has excellent and equal radial pulses Pending a neck MRI  She has never had SVT again after the one event, worries it could have done something to her that has caused these symptoms to evolve now  No CP, no palpitations No syncope   Past Medical History:  Diagnosis Date   Depression    Diabetes mellitus without complication ([place])    GERD (gastroesophageal reflux disease)    History of ETOH abuse    recovering  in remission   Hyperlipidemia    Hypertension    Osteoarthritis    Osteopenia    dexa -1.7 h -1.35    Scoliosis    leg length discrepancy   Skin cancer    basal cell nose   Sleep apnea    did not meet criteria for cpap-has  mouthpiece not using per pt   SVT (supraventricular tachycardia) ([place]) 08/15/2016   Varicose veins    Wears contact lenses     Past Surgical History:  Procedure Laterality Date   BREAST BIOPSY     2003-rt   CATARACT EXTRACTION, BILATERAL  2019   COLONOSCOPY W/ POLYPECTOMY  [id number],1751   precancerous   2004   DENTAL SURGERY  2015   bone graft    KNEE ARTHROSCOPY WITH LATERAL MENISECTOMY Left 11/10/2014   Procedure: LEFT KNEE ARTHROSCOPY PARTIAL LATERAL MENISECTOMY CHONDROPLASTY;  Surgeon: Hessie Dibble, MD;  Location: [place];  Service: Orthopedics;  Laterality: Left;   NASAL SINUS SURGERY  11-13-2008   polyps removed   skin cancer removal     basal cell face   TONSILLECTOMY AND ADENOIDECTOMY  1967   VENOUS ABLATION  2015   Dr Kellie Simmering    Current Outpatient Medications  Medication Sig Dispense Refill   atorvastatin (LIPITOR) 40 MG tablet TAKE 1 TABLET ONCE DAILY **NEEDS APPOINTMENT FOR FUTURE REFILLS** 90 tablet 1   augmented betamethasone dipropionate (DIPROLENE-AF) 0.05 % ointment Apply 1 application topically daily.     azelastine (ASTELIN) 0.1 % nasal spray INSTILL 1 2 SPRAYS INTO EACH NOSTRIL TWICE A DAY     carvedilol (  COREG) 12.5 MG tablet Take 1 tablet (12.5 mg total) by mouth 2 (two) times daily. 180 tablet 3   cetirizine (ZYRTEC) 10 MG tablet Take 1 tablet by mouth as needed for allergies.     Cyanocobalamin (B-12) 2000 MCG TABS Take by mouth daily.     cycloSPORINE (RESTASIS) 0.05 % ophthalmic emulsion Place 1 drop into both eyes at bedtime.     DULoxetine (CYMBALTA) 60 MG capsule TAKE 2 CAPSULES BY MOUTH AT BEDTIME 180 capsule 1   EPINEPHrine 0.3 mg/0.3 mL IJ SOAJ injection USE AS DIRECTED AS NEEDED     FARXIGA 5 MG TABS tablet TAKE 1 TABLET BY MOUTH DAILY BEFORE BREAKFAST. 30 tablet 3   HYDROcodone-acetaminophen (NORCO) 10-325 MG tablet Take 1 tablet by mouth daily as needed.     levocetirizine (XYZAL) 5 MG tablet TAKE 1 TABLET BY MOUTH EVERY DAY IN  THE EVENING     losartan (COZAAR) 100 MG tablet TAKE 1 TABLET BY MOUTH EVERY DAY 90 tablet 1   Magnesium 400 MG TABS Take 400 mg by mouth 2 (two) times daily.     metFORMIN (GLUCOPHAGE) 500 MG tablet TAKE 4 TABLETS BY MOUTH EVERY DAY WITH A MEAL 360 tablet 1   montelukast (SINGULAIR) 10 MG tablet TAKE 1 TABLET BY MOUTH EVERYDAY AT BEDTIME 90 tablet 1   omeprazole (PRILOSEC) 40 MG capsule TAKE 1 CAPSULE BY MOUTH EVERY DAY 90 capsule 1   pregabalin (LYRICA) 50 MG capsule TAKE 2 CAPSULES BY MOUTH EVERY MORNING AND 3 CAPS AT BEDTIME     No current facility-administered medications for this visit.    Allergies:   Bactrim [sulfamethoxazole-trimethoprim], Cephalexin, Sulfamethoxazole, and Amoxicillin   Social History:  The patient  reports that she has never smoked. She has never used smokeless tobacco. She reports that she does not drink alcohol and does not use drugs.   Family History:  The patient's family history includes Alcohol abuse in her brother and brother; Arthritis in her brother; Colon polyps in her mother; Diabetes in her mother; Heart failure in her father; Hyperlipidemia in her father and mother; Lymphoma in her mother. ROS:  Please see the history of present illness.    All other systems are reviewed and otherwise negative.   PHYSICAL EXAM:  VS:  There were no vitals taken for this visit. BMI: There is no height or weight on file to calculate BMI. Well nourished, well developed, in no acute distress HEENT: normocephalic, atraumatic Neck: no JVD, carotid bruits or masses Cardiac:  RRR; no significant murmurs, no rubs, or gallops Lungs:  CTA b/l, no wheezing, rhonchi or rales Abd: soft, nontender MS: no deformity or atrophy Ext: no edema Skin: warm and dry, no rash Neuro:  No gross deficits appreciated Psych: euthymic mood, full affect    EKG:  Done today and reviewed by myself shows  SR 75bpm  03/14/21; TTE IMPRESSIONS   1. Left ventricular ejection fraction, by  estimation, is 60 to 65%. The  left ventricle has normal function. The left ventricle has no regional  wall motion abnormalities. There is mild left ventricular hypertrophy.  Left ventricular diastolic parameters  were normal.   2. Right ventricular systolic function is normal. The right ventricular  size is normal. There is normal pulmonary artery systolic pressure.   3. The mitral valve is normal in structure. No evidence of mitral valve  regurgitation. No evidence of mitral stenosis.   4. The aortic valve is tricuspid. There is mild calcification of the  aortic valve. There is mild thickening of the aortic valve. Aortic valve  regurgitation is not visualized. Mild aortic valve sclerosis is present,  with no evidence of aortic valve  stenosis.   5. The inferior vena cava is normal in size with greater than 50%  respiratory variability, suggesting right atrial pressure of 3 mmHg.   Comparison(s): No significant change from prior study.    02/21/21: Stress myoview The left ventricular ejection fraction is normal (55-65%). Nuclear stress EF: 62%. There was no ST segment deviation noted during stress. The study is normal. This is a low risk study.   Recent Labs: 04/30/2021: ALT 19; BUN 12; Creatinine, Ser 0.85; Hemoglobin 12.0; Platelets 305.0; Potassium 4.3; Sodium 137; TSH 2.16  04/30/2021: Cholesterol 183; HDL 48.80; LDL Cholesterol 96; Total CHOL/HDL Ratio 4; Triglycerides 191.0; VLDL 38.2   CrCl cannot be calculated (Patient's most recent lab result is older than the maximum 21 days allowed.).   Wt Readings from Last 3 Encounters:  08/13/21 188 lb 12.8 oz (85.6 kg)  04/30/21 188 lb 9.6 oz (85.5 kg)  02/21/21 194 lb (88 kg)     Other studies reviewed: Additional studies/records reviewed today include: summarized above  ASSESSMENT AND PLAN:  SVT Single episode years ago  HTN Looks good  Orthostatic lightheadedness Discussed importance of hydration Will reduce her  losartan some, she will monitor her BP at home for higher readings No symptoms with orthostatics today, minimal change in BP supine > sitting    I have recommended that she follow up with her PMD for her chronic fatigue, vertigo, "brain fog" for further discussion and evaluation   Disposition: F/u with [place] in a few months, sooner if needed  Current medicines are reviewed at length with the patient today.  The patient did not have any concerns regarding medicines.  Venetia Night, PA-C 12/06/2021 4:01 AM     [facility] [place] [address] [phone] (office)  [phone] (fax)

## 2021-12-07 ENCOUNTER — Ambulatory Visit
Admission: RE | Admit: 2021-12-07 | Discharge: 2021-12-07 | Disposition: A | Discharge status: Home / Self Care | Payer: 59 | Source: Ambulatory Visit | Attending: Orthopaedic Surgery | Admitting: Orthopaedic Surgery

## 2021-12-07 DIAGNOSIS — M542 Cervicalgia: Secondary | ICD-10-CM

## 2021-12-09 ENCOUNTER — Ambulatory Visit: Payer: 59 | Admitting: Internal Medicine

## 2021-12-09 ENCOUNTER — Encounter: Payer: Self-pay | Admitting: Internal Medicine

## 2021-12-09 VITALS — BP 118/62 | HR 86 | Temp 98.0°F | Ht 68.5 in | Wt 176.0 lb

## 2021-12-09 DIAGNOSIS — G4733 Obstructive sleep apnea (adult) (pediatric): Secondary | ICD-10-CM

## 2021-12-09 DIAGNOSIS — R42 Dizziness and giddiness: Secondary | ICD-10-CM | POA: Diagnosis not present

## 2021-12-09 DIAGNOSIS — E1165 Type 2 diabetes mellitus with hyperglycemia: Secondary | ICD-10-CM

## 2021-12-09 DIAGNOSIS — R200 Anesthesia of skin: Secondary | ICD-10-CM

## 2021-12-09 DIAGNOSIS — Z79899 Other long term (current) drug therapy: Secondary | ICD-10-CM

## 2021-12-09 DIAGNOSIS — R634 Abnormal weight loss: Secondary | ICD-10-CM

## 2021-12-09 DIAGNOSIS — Z8616 Personal history of COVID-19: Secondary | ICD-10-CM

## 2021-12-09 DIAGNOSIS — M4802 Spinal stenosis, cervical region: Secondary | ICD-10-CM | POA: Diagnosis not present

## 2021-12-09 DIAGNOSIS — R202 Paresthesia of skin: Secondary | ICD-10-CM

## 2021-12-09 LAB — BASIC METABOLIC PANEL
BUN: 15 mg/dL (ref 6–23)
CO2: 30 mEq/L (ref 19–32)
Calcium: 10.2 mg/dL (ref 8.4–10.5)
Chloride: 101 mEq/L (ref 96–112)
Creatinine, Ser: 0.85 mg/dL (ref 0.40–1.20)
GFR: 69.25 mL/min (ref >60.00)
Glucose, Bld: 183 mg/dL — ABNORMAL HIGH (ref 70–99)
Potassium: 4.1 mEq/L (ref 3.5–5.1)
Sodium: 136 mEq/L (ref 135–145)

## 2021-12-09 LAB — CBC WITH DIFFERENTIAL/PLATELET
Basophils Absolute: 0.1 10*3/uL (ref 0.0–0.1)
Basophils Relative: 0.9 % (ref 0.0–3.0)
Eosinophils Absolute: 0.3 10*3/uL (ref 0.0–0.7)
Eosinophils Relative: 4.1 % (ref 0.0–5.0)
HCT: 37.1 % (ref 36.0–46.0)
Hemoglobin: 12.3 g/dL (ref 12.0–15.0)
Lymphocytes Relative: 34.6 % (ref 12.0–46.0)
Lymphs Abs: 2.6 10*3/uL (ref 0.7–4.0)
MCHC: 33.1 g/dL (ref 30.0–36.0)
MCV: 85.2 fl (ref 78.0–100.0)
Monocytes Absolute: 0.6 10*3/uL (ref 0.1–1.0)
Monocytes Relative: 8.4 % (ref 3.0–12.0)
Neutro Abs: 4 10*3/uL (ref 1.4–7.7)
Neutrophils Relative %: 52 % (ref 43.0–77.0)
Platelets: 312 10*3/uL (ref 150.0–400.0)
RBC: 4.35 Mil/uL (ref 3.87–5.11)
RDW: 16.1 % — ABNORMAL HIGH (ref 11.5–15.5)
WBC: 7.6 10*3/uL (ref 4.0–10.5)

## 2021-12-09 LAB — HEPATIC FUNCTION PANEL
ALT: 19 U/L (ref 0–35)
AST: 18 U/L (ref 0–37)
Albumin: 4.3 g/dL (ref 3.5–5.2)
Alkaline Phosphatase: 77 U/L (ref 39–117)
Bilirubin, Direct: 0.1 mg/dL (ref 0.0–0.3)
Total Bilirubin: 0.6 mg/dL (ref 0.2–1.2)
Total Protein: 7.4 g/dL (ref 6.0–8.3)

## 2021-12-09 LAB — HEMOGLOBIN A1C: Hgb A1c MFr Bld: 7.8 % — ABNORMAL HIGH (ref 4.6–6.5)

## 2021-12-09 NOTE — Progress Notes (Signed)
Chief Complaint  Patient presents with   Follow-up    HPI: Holly Beasley 71 y.o. come in for new sx concerns  on going and has seen cards and spine team.recently in related evaluation   About 6 weeks ago " I cant feel my hands/ fingers" no leg numbness or other acute findings weak all over but non focal weakness  Has had orthostatic dizziness and "vertigo"    seen cards last week and  losartan  dose to be decreased from 100 to 50 mg  since bp was on the low side   no svt recently  Had c spine mri this weekend Dr.  Patrice Paradise  to have a follow  up   Asks  about farxiga . Could this be a side effect  Asks if she can try free style  2 ? NOt aware of any low bg   Had infection with Covid  19 :august  had resp sx and bad sinus infection    and then booster later ? Fall  or  December ? ( In record total of 6 injections?)    Weight  loss 20 #  recently  Just not hungry?   Takes b12   no recent level  done  ROS: See pertinent positives and negatives per HPI. Doe since covid   not yet using [facility]sa mouth rx .  No current cough edema  fever   Past Medical History:  Diagnosis Date   Depression    Diabetes mellitus without complication ([facility])    GERD (gastroesophageal reflux disease)    History of ETOH abuse    recovering  in remission   Hyperlipidemia    Hypertension    Osteoarthritis    Osteopenia    dexa -1.7 h -1.35    Scoliosis    leg length discrepancy   Skin cancer    basal cell nose   Sleep apnea    did not meet criteria for cpap-has mouthpiece not using per pt   SVT (supraventricular tachycardia) ([place]) 08/15/2016   Varicose veins    Wears contact lenses     Family History  Problem Relation Age of Onset   Lymphoma Mother    Diabetes Mother    Hyperlipidemia Mother    Colon polyps Mother    Heart failure Father    Hyperlipidemia Father    Arthritis Brother    Alcohol abuse Brother    Alcohol abuse Brother    Colon cancer Neg Hx    Esophageal cancer Neg Hx    Rectal  cancer Neg Hx    Stomach cancer Neg Hx     Social History   Socioeconomic History   Marital status: Married    Spouse name: Not on file   Number of children: Not on file   Years of education: Not on file   Highest education level: Not on file  Occupational History   Not on file  Tobacco Use   Smoking status: Never   Smokeless tobacco: Never  Vaping Use   Vaping Use: Never used  Substance and Sexual Activity   Alcohol use: No    Alcohol/week: 0.0 standard drinks    Comment: recovering-1983   Drug use: No   Sexual activity: Not on file  Other Topics Concern   Not on file  Social History Narrative   2 cats   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[employer]   Married   ETOH recovering   [employer] 2    Has family in [place]         Social Determinants of Health   Financial Resource Strain: Not on file  Food Insecurity: Not on file  Transportation Needs: Not on file  Physical Activity: Not on file  Stress: Not on file  Social Connections: Not on file    Outpatient Medications Prior to Visit  Medication Sig Dispense Refill   atorvastatin (LIPITOR) 40 MG tablet TAKE 1 TABLET ONCE DAILY **NEEDS APPOINTMENT FOR FUTURE REFILLS** 90 tablet 1   augmented betamethasone dipropionate (DIPROLENE-AF) 0.05 % ointment Apply 1 application topically daily.     azelastine (ASTELIN) 0.1 % nasal spray INSTILL 1 2 SPRAYS INTO EACH NOSTRIL TWICE A DAY     carvedilol (COREG) 12.5 MG tablet Take 1 tablet (12.5 mg total) by mouth 2 (two) times daily. 180 tablet 3   cetirizine (ZYRTEC) 10 MG tablet Take 1 tablet by mouth as needed for allergies.     Cyanocobalamin (B-12) 2000 MCG TABS Take by mouth daily.     DULoxetine (CYMBALTA) 60 MG capsule TAKE 2 CAPSULES BY MOUTH AT BEDTIME 180 capsule 1   EPINEPHrine 0.3 mg/0.3 mL IJ SOAJ injection USE AS DIRECTED AS NEEDED     FARXIGA 5 MG TABS tablet TAKE 1 TABLET BY MOUTH DAILY  BEFORE BREAKFAST. 30 tablet 3   HYDROcodone-acetaminophen (NORCO) 10-325 MG tablet Take 1 tablet by mouth daily as needed.     losartan (COZAAR) 50 MG tablet Take 1 tablet (50 mg total) by mouth daily. 90 tablet 1   Magnesium 400 MG TABS Take 400 mg by mouth 2 (two) times daily.     metFORMIN (GLUCOPHAGE) 500 MG tablet TAKE 4 TABLETS BY MOUTH EVERY DAY WITH A MEAL 360 tablet 1   montelukast (SINGULAIR) 10 MG tablet TAKE 1 TABLET BY MOUTH EVERYDAY AT BEDTIME 90 tablet 1   omeprazole (PRILOSEC) 40 MG capsule TAKE 1 CAPSULE BY MOUTH EVERY DAY 90 capsule 1   pregabalin (LYRICA) 50 MG capsule TAKE 2 CAPSULES BY MOUTH EVERY MORNING AND 3 CAPS AT BEDTIME     No facility-administered medications prior to visit.     EXAM:  BP 118/62    Pulse 86    Temp 98 F (36.7 C) (Oral)    Ht 5' 8.5" (1.74 m)    Wt 176 lb (79.8 kg)    SpO2 98%    BMI 26.37 kg/m   Body mass index is 26.37 kg/m.  GENERAL: vitals reviewed and listed above, alert, oriented, appears well hydrated and in no acute distress independent gait   favoring knee etc  HEENT: atraumatic, conjunctiva  clear, no obvious abnormalities on inspection of external nose and ears OP :masked  NECK: no obvious masses on inspection palpation  LUNGS: clear to auscultation bilaterally, no wheezes, rales or rhonchi, good air movement CV: HRRR, no clubbing cyanosis onl cap refill  Abdomen:  Sof,t normal bowel sounds without hepatosplenomegaly, no guarding rebound or masses no CVA tenderness MS: moves all extremities without noticeable focal  abnormality strength not tested  but uses all  Neuro no tremor feels usteady  PSYCH: pleasant and cooperative, no obvious depression or anxiety Lab Results  Component Value Date   WBC 7.1 04/30/2021   HGB 12.0 04/30/2021   HCT 35.2 (L) 04/30/2021   PLT 305.0 04/30/2021   GLUCOSE 183 (H) 04/30/2021   CHOL 183 04/30/2021   TRIG 191.0 (  H) 04/30/2021   HDL 48.80 04/30/2021   LDLDIRECT 171.0 10/10/2016   LDLCALC  96 04/30/2021   ALT 19 04/30/2021   AST 19 04/30/2021   NA 137 04/30/2021   K 4.3 04/30/2021   CL 103 04/30/2021   CREATININE 0.85 04/30/2021   BUN 12 04/30/2021   CO2 26 04/30/2021   TSH 2.16 04/30/2021   HGBA1C 6.8 (A) 08/13/2021   MICROALBUR <0.7 04/30/2021   BP Readings from Last 3 Encounters:  12/09/21 118/62  12/06/21 110/72  08/13/21 134/66  No results found for: [id number]  ASSESSMENT AND PLAN:  Discussed the following assessment and plan:  Numbness and tingling in both hands - Plan: Basic metabolic panel, CBC with Differential/Platelet, Hemoglobin A1c, Hepatic function panel, TSH, T4, free, Vitamin B12, Vitamin B12, T4, free, TSH, Hepatic function panel, Hemoglobin A1c, CBC with Differential/Platelet, Basic metabolic panel  Orthostatic dizziness  Type 2 diabetes mellitus with hyperglycemia, without long-term current use of insulin (HCC) - Plan: Basic metabolic panel, CBC with Differential/Platelet, Hemoglobin A1c, Hepatic function panel, TSH, T4, free, Vitamin B12, Vitamin B12, T4, free, TSH, Hepatic function panel, Hemoglobin A1c, CBC with Differential/Platelet, Basic metabolic panel  Medication management - Plan: Basic metabolic panel, CBC with Differential/Platelet, Hemoglobin A1c, Hepatic function panel, TSH, T4, free, Vitamin B12, Vitamin B12, T4, free, TSH, Hepatic function panel, Hemoglobin A1c, CBC with Differential/Platelet, Basic metabolic panel  Spinal stenosis of cervical region - Plan: Basic metabolic panel, CBC with Differential/Platelet, Hemoglobin A1c, Hepatic function panel, TSH, T4, free, Vitamin B12, Vitamin B12, T4, free, TSH, Hepatic function panel, Hemoglobin A1c, CBC with Differential/Platelet, Basic metabolic panel  Weight loss - states 20 #  dec appetite  uncertain if med related   History of COVID-19 - record says total of 6   mrna vaccines  OSA (obstructive sleep apnea) - planning to get back on  oral device  Multiple sx poss multifactorial  cause. Back off on the pregabalin for now ( to avoid cns sx  interfering with eval)  Ok to give trial off farxiga ( ortho hypotension )   dec the losartan may also help . Poss covid triggers   (pots has been  described  post vaccine and post covid infection but dont know  time course; also  do not know the effect of 6 covid injections and infection together  )  ? If vertigo related to orthostatic or  vestibular.  Advise neuro opinion after  info gathering  update lab .  Include Dr Patrice Paradise  team and cards input.  Suspect dm in control and weight loss could be from [place] other process?  She asks for free style libre 2( not aware will be covered since she is not on insulin as her husband is who uses)  -Patient advised to return or notify health care team  if  new concerns arise. Review  counsel plan and fu  out reach team  40 minutes  Patient Instructions  For now wean off the  pregabalin  agree with decrease losartan dose .  Update labs.  Can try off of farxiga also   after    monitor  bp readings.   Consider   seeing neurology for evaluation and consider pots eval via cardiology.   Suspect covid with the vaccine  may have added to these symptoms .   Plan follow up in a month  or as needed to figure out sx.   Standley Brooking. Holiday Mcmenamin M.D.

## 2021-12-09 NOTE — Patient Instructions (Addendum)
For now wean off the  pregabalin  agree with decrease losartan dose .  Update labs.  Can try off of farxiga also   after    monitor  bp readings.   Consider   seeing neurology for evaluation and consider pots eval via cardiology.   Suspect covid with the vaccine  may have added to these symptoms .   Plan follow up in a month  or as needed to figure out sx.

## 2021-12-10 LAB — TSH: TSH: 0.91 u[IU]/mL (ref 0.35–5.50)

## 2021-12-10 LAB — T4, FREE: Free T4: 0.77 ng/dL (ref 0.60–1.60)

## 2021-12-10 LAB — VITAMIN B12: Vitamin B-12: 802 pg/mL (ref 211–911)

## 2021-12-12 ENCOUNTER — Telehealth: Payer: Self-pay | Admitting: Internal Medicine

## 2021-12-12 NOTE — Telephone Encounter (Signed)
Patient came by to drop off form to be completed for surgery clearance. Form was filled out and paperwork was placed in folder to be completed. ? ? ? ?Please advise  ?

## 2021-12-16 ENCOUNTER — Telehealth: Payer: Self-pay

## 2021-12-16 NOTE — Telephone Encounter (Signed)
Left message for patient to call back regarding surgical clearance  ?

## 2021-12-16 NOTE — Telephone Encounter (Signed)
Patient is returning call. Call transferred to pre-op. ?

## 2021-12-16 NOTE — Telephone Encounter (Signed)
? ?  Primary Cardiologist: None ? ?Chart reviewed as part of pre-operative protocol coverage. Given past medical history and time since last visit, based on ACC/AHA guidelines, Holly Beasley would be at acceptable risk for the planned procedure without further cardiovascular testing.  ? ?Holly Beasley reports no further symptoms of chest pain or syncope. No episodes of SVT. Holly Beasley continues to have some mild dyspnea and is limited physically by knee pain. Holly Beasley reports dyspnea is stable, slightly improved from June 2022 when Holly Beasley had normal echo and myoview.  ? ?Patient was advised that if Holly Beasley develops new symptoms prior to surgery to contact our office to arrange a follow-up appointment.  Holly Beasley verbalized understanding. ? ?Her RCRI score for MACE is 0.4%. Holly Beasley can achieve > 4 METS activity.  ? ?I will route this recommendation to the requesting party via Epic fax function and remove from pre-op pool. ? ?Please call with questions. ? ?Emmaline Life, NP-C ? ?  ?12/16/2021, 4:12 PM ?[place] ?[address], [address] ?Office [phone] Fax [phone] ? ? ? ?

## 2021-12-16 NOTE — Telephone Encounter (Addendum)
? ?  Pre-operative Risk Assessment  ?  ?Patient Name: Holly Beasley  ?DOB: [birth year] ?MRN: [id number]  ? ?  ? ?Request for Surgical Clearance   ? ?Procedure:   ACDF  ( PROCEDURE C4-7 WITH PLATE ) ? ?Date of Surgery:  Clearance TBD                              ?   ?Surgeon:  Dr. Starling Manns ?Surgeon's Group or Practice Name:  [facility] ?Phone number:  [phone] ?Fax number:  [phone] ?  ?Type of Clearance Requested:   ?- Medical  ?  ?Type of Anesthesia:  General pt to be under for at least 2 hours  ?  ?Additional requests/questions:     ? ? ?

## 2021-12-17 ENCOUNTER — Other Ambulatory Visit: Payer: Self-pay

## 2021-12-17 DIAGNOSIS — E1165 Type 2 diabetes mellitus with hyperglycemia: Secondary | ICD-10-CM

## 2021-12-17 MED ORDER — FREESTYLE LIBRE 14 DAY SENSOR MISC: 0 refills | Status: DC

## 2021-12-17 MED ORDER — FREESTYLE LIBRE 14 DAY READER DEVI: 1.0000 [IU] | Freq: Every day | 0 refills | Status: DC

## 2021-12-17 NOTE — Progress Notes (Signed)
Holly Beasley Please order  free style libre 2   for her DM

## 2021-12-17 NOTE — Progress Notes (Signed)
Blood count better , kidney function normal .  Hemoglobin A1c is increased from last visit 6.8-7.8 B12 liver and thyroid are all normal  if you feel no difference being off the [place] then restart.

## 2021-12-19 NOTE — Telephone Encounter (Signed)
Done

## 2021-12-19 NOTE — Telephone Encounter (Signed)
I signed  approval  on your desk  and please send  copy of recent labs with form

## 2021-12-31 LAB — HM DIABETES EYE EXAM

## 2022-01-06 ENCOUNTER — Ambulatory Visit: Payer: 59 | Admitting: Internal Medicine

## 2022-01-28 ENCOUNTER — Encounter: Payer: Self-pay | Admitting: Internal Medicine

## 2022-02-01 ENCOUNTER — Other Ambulatory Visit: Payer: Self-pay | Admitting: Internal Medicine

## 2022-02-24 NOTE — Progress Notes (Deleted)
No chief complaint on file.   HPI: Holly Beasley 71 y.o. come in for  ROS: See pertinent positives and negatives per HPI.  Past Medical History:  Diagnosis Date   Depression    Diabetes mellitus without complication (HCC)    GERD (gastroesophageal reflux disease)    History of ETOH abuse    recovering  in remission   Hyperlipidemia    Hypertension    Osteoarthritis    Osteopenia    dexa -1.7 h -1.35    Scoliosis    leg length discrepancy   Skin cancer    basal cell nose   Sleep apnea    did not meet criteria for cpap-has mouthpiece not using per pt   SVT (supraventricular tachycardia) ([place]) 08/15/2016   Varicose veins    Wears contact lenses     Family History  Problem Relation Age of Onset   Lymphoma Mother    Diabetes Mother    Hyperlipidemia Mother    Colon polyps Mother    Heart failure Father    Hyperlipidemia Father    Arthritis Brother    Alcohol abuse Brother    Alcohol abuse Brother    Colon cancer Neg Hx    Esophageal cancer Neg Hx    Rectal cancer Neg Hx    Stomach cancer Neg Hx     Social History   Socioeconomic History   Marital status: Married    Spouse name: Not on file   Number of children: Not on file   Years of education: Not on file   Highest education level: Not on file  Occupational History   Not on file  Tobacco Use   Smoking status: Never   Smokeless tobacco: Never  Vaping Use   Vaping Use: Never used  Substance and Sexual Activity   Alcohol use: No    Alcohol/week: 0.0 standard drinks    Comment: recovering-1983   Drug use: No   Sexual activity: Not on file  Other Topics Concern   Not on file  Social History Narrative   2 cats   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[employer]   Married   ETOH recovering   Hhof 2    Has family in [place]         Social Determinants of Health   Financial Resource Strain: Not on file  Food Insecurity:  Not on file  Transportation Needs: Not on file  Physical Activity: Not on file  Stress: Not on file  Social Connections: Not on file    Outpatient Medications Prior to Visit  Medication Sig Dispense Refill   atorvastatin (LIPITOR) 40 MG tablet TAKE 1 TABLET ONCE DAILY **NEEDS APPOINTMENT FOR FUTURE REFILLS** 90 tablet 1   augmented betamethasone dipropionate (DIPROLENE-AF) 0.05 % ointment Apply 1 application topically daily.     azelastine (ASTELIN) 0.1 % nasal spray INSTILL 1 2 SPRAYS INTO EACH NOSTRIL TWICE A DAY     carvedilol (COREG) 12.5 MG tablet Take 1 tablet (12.5 mg total) by mouth 2 (two) times daily. 180 tablet 3   cetirizine (ZYRTEC) 10 MG tablet Take 1 tablet by mouth as needed for allergies.     Continuous Blood Gluc Receiver (FREESTYLE LIBRE 14 DAY READER) DEVI 1 Units by Does not apply route daily. 1 each 0   Continuous Blood Gluc Sensor (FREESTYLE LIBRE 14 DAY SENSOR) MISC Check glucose daily 1 each  0   Cyanocobalamin (B-12) 2000 MCG TABS Take by mouth daily.     DULoxetine (CYMBALTA) 60 MG capsule TAKE 2 CAPSULES BY MOUTH AT BEDTIME 180 capsule 1   EPINEPHrine 0.3 mg/0.3 mL IJ SOAJ injection USE AS DIRECTED AS NEEDED     FARXIGA 5 MG TABS tablet TAKE 1 TABLET BY MOUTH EVERY DAY BEFORE BREAKFAST 30 tablet 3   HYDROcodone-acetaminophen (NORCO) 10-325 MG tablet Take 1 tablet by mouth daily as needed.     losartan (COZAAR) 50 MG tablet Take 1 tablet (50 mg total) by mouth daily. 90 tablet 1   Magnesium 400 MG TABS Take 400 mg by mouth 2 (two) times daily.     metFORMIN (GLUCOPHAGE) 500 MG tablet TAKE 4 TABLETS BY MOUTH EVERY DAY WITH A MEAL 360 tablet 1   montelukast (SINGULAIR) 10 MG tablet TAKE 1 TABLET BY MOUTH EVERYDAY AT BEDTIME 90 tablet 1   omeprazole (PRILOSEC) 40 MG capsule TAKE 1 CAPSULE BY MOUTH EVERY DAY 90 capsule 1   pregabalin (LYRICA) 50 MG capsule TAKE 2 CAPSULES BY MOUTH EVERY MORNING AND 3 CAPS AT BEDTIME     No facility-administered medications prior to  visit.     EXAM:  There were no vitals taken for this visit.  There is no height or weight on file to calculate BMI.  GENERAL: vitals reviewed and listed above, alert, oriented, appears well hydrated and in no acute distress HEENT: atraumatic, conjunctiva  clear, no obvious abnormalities on inspection of external nose and ears OP : no lesion edema or exudate  NECK: no obvious masses on inspection palpation  LUNGS: clear to auscultation bilaterally, no wheezes, rales or rhonchi, good air movement CV: HRRR, no clubbing cyanosis or  peripheral edema nl cap refill  MS: moves all extremities without noticeable focal  abnormality PSYCH: pleasant and cooperative, no obvious depression or anxiety Lab Results  Component Value Date   WBC 7.6 12/09/2021   HGB 12.3 12/09/2021   HCT 37.1 12/09/2021   PLT 312.0 12/09/2021   GLUCOSE 183 (H) 12/09/2021   CHOL 183 04/30/2021   TRIG 191.0 (H) 04/30/2021   HDL 48.80 04/30/2021   LDLDIRECT 171.0 10/10/2016   LDLCALC 96 04/30/2021   ALT 19 12/09/2021   AST 18 12/09/2021   NA 136 12/09/2021   K 4.1 12/09/2021   CL 101 12/09/2021   CREATININE 0.85 12/09/2021   BUN 15 12/09/2021   CO2 30 12/09/2021   TSH 0.91 12/09/2021   HGBA1C 7.8 (H) 12/09/2021   MICROALBUR <0.7 04/30/2021   BP Readings from Last 3 Encounters:  12/09/21 118/62  12/06/21 110/72  08/13/21 134/66    ASSESSMENT AND PLAN:  Discussed the following assessment and plan:  No diagnosis found.  -Patient advised to return or notify health care team  if  new concerns arise.  There are no Patient Instructions on file for this visit.   Standley Brooking. Emran Molzahn M.D.

## 2022-02-25 ENCOUNTER — Ambulatory Visit: Payer: 59 | Admitting: Internal Medicine

## 2022-02-25 ENCOUNTER — Encounter: Payer: Self-pay | Admitting: Internal Medicine

## 2022-02-25 VITALS — BP 134/80 | HR 77 | Temp 97.8°F | Ht 68.5 in | Wt 166.8 lb

## 2022-02-25 DIAGNOSIS — R42 Dizziness and giddiness: Secondary | ICD-10-CM

## 2022-02-25 NOTE — Progress Notes (Signed)
? ?Chief Complaint  ?Patient presents with  ? Dizziness  ? ? ?HPI: ?Holly Beasley 71 y.o. come in for  vertigo sx  had sx 2 23  ? ?Epsidoes or recurring and concerning had one this past weekend.  Bent over and  but still feels off when stand and walk.  Has longstanding underlying tinnitus but no acute hearing changes ?Sometimes feels she will lose her balance when she has this no other neuro signs ?Losing weight   and tired  and stres ekg and cardio.  Were unrevealing. ? ?No vision and hearing change . ?This weekend  leaned over to put in trash can  and had to hold on to counter  hard to stand  less than a minutes. Felt off .  ?She has been seeing Dr. Redmond Baseman for a number of years for sinus problems and surgery needs a new referral to ENT Dr. Redmond Baseman for vertigo.  Is a new problem evaluation. ? ?No fever headaches. ?ROS: See pertinent positives and negatives per HPI. ? ?Past Medical History:  ?Diagnosis Date  ? Depression   ? Diabetes mellitus without complication ([employer])   ? GERD (gastroesophageal reflux disease)   ? History of ETOH abuse   ? recovering  in remission  ? Hyperlipidemia   ? Hypertension   ? Osteoarthritis   ? Osteopenia   ? dexa -1.7 h -1.35   ? Scoliosis   ? leg length discrepancy  ? Skin cancer   ? basal cell nose  ? Sleep apnea   ? did not meet criteria for cpap-has mouthpiece not using per pt  ? SVT (supraventricular tachycardia) ([place]) 08/15/2016  ? Varicose veins   ? Wears contact lenses   ? ? ?Family History  ?Problem Relation Age of Onset  ? Lymphoma Mother   ? Diabetes Mother   ? Hyperlipidemia Mother   ? Colon polyps Mother   ? Heart failure Father   ? Hyperlipidemia Father   ? Arthritis Brother   ? Alcohol abuse Brother   ? Alcohol abuse Brother   ? Colon cancer Neg Hx   ? Esophageal cancer Neg Hx   ? Rectal cancer Neg Hx   ? Stomach cancer Neg Hx   ? ? ?Social History  ? ?Socioeconomic History  ? Marital status: Married  ?  Spouse name: Not on file  ? Number of children: Not on file  ? Years  of education: Not on file  ? Highest education level: Not on file  ?Occupational History  ? Not on file  ?Tobacco Use  ? Smoking status: Never  ? Smokeless tobacco: Never  ?Vaping Use  ? Vaping Use: Never used  ?Substance and Sexual Activity  ? Alcohol use: No  ?  Alcohol/week: 0.0 standard drinks  ?  Comment: recovering-1983  ? Drug use: No  ? Sexual activity: Not on file  ?Other Topics Concern  ? Not on file  ?Social History Narrative  ? 2 cats  ? 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   ? 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 Married  ? ETOH recovering  ? Hhof 2   ? Has family in [place]  ?   ?   ? ?Social Determinants of Health  ? ?Financial Resource Strain: Not on file  ?Food Insecurity: Not on file  ?Transportation Needs: Not on file  ?Physical Activity: Not on file  ?Stress: Not on file  ?Social Connections: Not  on file  ? ? ?Outpatient Medications Prior to Visit  ?Medication Sig Dispense Refill  ? atorvastatin (LIPITOR) 40 MG tablet TAKE 1 TABLET ONCE DAILY **NEEDS APPOINTMENT FOR FUTURE REFILLS** 90 tablet 1  ? augmented betamethasone dipropionate (DIPROLENE-AF) 0.05 % ointment Apply 1 application topically daily.    ? azelastine (ASTELIN) 0.1 % nasal spray INSTILL 1 2 SPRAYS INTO EACH NOSTRIL TWICE A DAY    ? carvedilol (COREG) 12.5 MG tablet Take 1 tablet (12.5 mg total) by mouth 2 (two) times daily. 180 tablet 3  ? cetirizine (ZYRTEC) 10 MG tablet Take 1 tablet by mouth as needed for allergies.    ? Continuous Blood Gluc Receiver (FREESTYLE LIBRE 14 DAY READER) DEVI 1 Units by Does not apply route daily. 1 each 0  ? Continuous Blood Gluc Sensor (FREESTYLE LIBRE 14 DAY SENSOR) MISC Check glucose daily 1 each 0  ? Cyanocobalamin (B-12) 2000 MCG TABS Take by mouth daily.    ? DULoxetine (CYMBALTA) 60 MG capsule TAKE 2 CAPSULES BY MOUTH AT BEDTIME 180 capsule 1  ? EPINEPHrine 0.3 mg/0.3 mL IJ SOAJ injection USE AS DIRECTED AS NEEDED    ? FARXIGA 5 MG TABS tablet  TAKE 1 TABLET BY MOUTH EVERY DAY BEFORE BREAKFAST 30 tablet 3  ? HYDROcodone-acetaminophen (NORCO) 10-325 MG tablet Take 1 tablet by mouth daily as needed.    ? losartan (COZAAR) 50 MG tablet Take 1 tablet (50 mg total) by mouth daily. 90 tablet 1  ? Magnesium 400 MG TABS Take 400 mg by mouth 2 (two) times daily.    ? metFORMIN (GLUCOPHAGE) 500 MG tablet TAKE 4 TABLETS BY MOUTH EVERY DAY WITH A MEAL 360 tablet 1  ? montelukast (SINGULAIR) 10 MG tablet TAKE 1 TABLET BY MOUTH EVERYDAY AT BEDTIME 90 tablet 1  ? omeprazole (PRILOSEC) 40 MG capsule TAKE 1 CAPSULE BY MOUTH EVERY DAY 90 capsule 1  ? pregabalin (LYRICA) 50 MG capsule TAKE 2 CAPSULES BY MOUTH EVERY MORNING AND 3 CAPS AT BEDTIME    ? ?No facility-administered medications prior to visit.  ? ? ? ?EXAM: ? ?BP 134/80 (BP Location: Left Arm, Patient Position: Sitting, Cuff Size: Normal)   Pulse 77   Temp 97.8 ?F (36.6 ?C) (Oral)   Ht 5' 8.5" (1.74 m)   Wt 166 lb 12.8 oz (75.7 kg)   SpO2 99%   BMI 24.99 kg/m?  ? ?Body mass index is 24.99 kg/m?. ? ?GENERAL: vitals reviewed and listed above, alert, oriented, appears well hydrated and in no acute distress ?HEENT: atraumatic, conjunctiva  clear, no obvious abnormalities on inspection of external nose and ear TMs clear ?NECK: no obvious masses on inspection palpation healing surgical scar left lower anterior ? ?CV: HRRR, no clubbing cyanosis or  peripheral edema nl cap refill  ?MS: moves all extremities without noticeable focal  abnormality ?Has a cane but walks unassisted negative Romberg drift no tremor ?PSYCH: pleasant and cooperative, no obvious depression or anxiety ?Lab Results  ?Component Value Date  ? WBC 7.6 12/09/2021  ? HGB 12.3 12/09/2021  ? HCT 37.1 12/09/2021  ? PLT 312.0 12/09/2021  ? GLUCOSE 183 (H) 12/09/2021  ? CHOL 183 04/30/2021  ? TRIG 191.0 (H) 04/30/2021  ? HDL 48.80 04/30/2021  ? LDLDIRECT 171.0 10/10/2016  ? [place] 96 04/30/2021  ? ALT 19 12/09/2021  ? AST 18 12/09/2021  ? NA 136  12/09/2021  ? K 4.1 12/09/2021  ? CL 101 12/09/2021  ? CREATININE 0.85 12/09/2021  ? BUN  15 12/09/2021  ? CO2 30 12/09/2021  ? TSH 0.91 12/09/2021  ? HGBA1C 7.8 (H) 12/09/2021  ? MICROALBUR <0.7 04/30/2021  ? ?BP Readings from Last 3 Encounters:  ?02/25/22 134/80  ?12/09/21 118/62  ?12/06/21 110/72  ? ? ?ASSESSMENT AND PLAN: ? ?Discussed the following assessment and plan: ? ?Vertigo ?Episodic sounds positional may be vestibular but problematic risk of fall when happens ?Recent C-spine surgery fusion seems to be unrelated ?Agree with referral to ENT Dr. Redmond Baseman ?-Patient advised to return or notify health care team  if  new concerns arise. ? ?Patient Instructions  ?I will do the referral to Dr Redmond Baseman  ENT  for the recurrent vertigo . ?No alarming sx  do I see you today .  ? ? ? ? ? ? ?Standley Brooking. Darel Ricketts M.D. ?

## 2022-02-25 NOTE — Patient Instructions (Addendum)
I will do the referral to Dr Redmond Baseman  ENT  for the recurrent vertigo . ?No alarming sx  do I see you today .  ? ? ? ? ?

## 2022-03-10 NOTE — Progress Notes (Unsigned)
Cardiology Office Note Date:  03/10/2022  Patient ID:  Holly Beasley, Holly Beasley 10/30/1950, MRN [id number] PCP:  Burnis Medin, MD  Electrophysiologist: Dr. Rayann Heman    Chief Complaint: planned f/u  History of Present Illness: Holly Beasley is a 71 y.o. female with history of HTN, HLD, DM, GERD, SVT (short RP, adenosine sensitive).  She comes in today to be seen for Dr. Rayann Heman, last seen by him May 2022, at that time he mentions reports of progressive SOB and fatigue, chronic edema as stable, reduced activity 2/2 knee pain. Planned for echo, stress test, her coreg reduced She had not had any SVT  TTE with LVEF 60-65%, no WMA Stres test was normal, low risk  Pt called with months of symptoms since a COVID infection Aug 2022 Saw her PMD Nov 2022, COVID aug 2022, starting to feel better, less fatigue and SOB  I saw her 12/06/21 She has number of symptoms/worries Orthostatic dizziness, not every time she stands up, but often, more so after sitting a long time Marked fatigue This goes back perhaps a year or so, worse after COVID but there before as well, a constant sense of having no energy 3.     Remains with brain fog after COVID 4.    Had an episode of room spinning vertigo in bed a couple nights ago 5.    B/l hand numbness that comes/goes She has excellent and equal radial pulses Pending a neck MRI  She has never had SVT again after the one event, worries it could have done something to her that has caused these symptoms to evolve now No CP, no palpitations No syncope  No symptoms with mildly abnormal orthostatics, losartan was reduced some, encouraged hydration. Did not think SVT years ago causing symptoms or damaged t he heart. Recommended that she see her PMD.  She did see her PMD,  farxiga was stopped, was pending f/u with Dr. Loma Sousa s/p C-spine   01/13/22 C4-5 and 6-6 fusion  Saw PMD 02/25/22, ongoing/escalating vertigo referred to ENT  TODAY She has not had any  further orthostatic symptoms since the reduction in her losartan No CP, palpitations or cardiac awareness No SVT No near syncope or syncope. She is feeling better  Mild vertigo symptoms, pending ENT visit    Past Medical History:  Diagnosis Date   Depression    Diabetes mellitus without complication (HCC)    GERD (gastroesophageal reflux disease)    History of ETOH abuse    recovering  in remission   Hyperlipidemia    Hypertension    Osteoarthritis    Osteopenia    dexa -1.7 h -1.35    Scoliosis    leg length discrepancy   Skin cancer    basal cell nose   Sleep apnea    did not meet criteria for cpap-has mouthpiece not using per pt   SVT (supraventricular tachycardia) ([place]) 08/15/2016   Varicose veins    Wears contact lenses     Past Surgical History:  Procedure Laterality Date   BREAST BIOPSY     2003-rt   CATARACT EXTRACTION, BILATERAL  2019   COLONOSCOPY W/ POLYPECTOMY  [id number]   precancerous   2004   DENTAL SURGERY  2015   bone graft    KNEE ARTHROSCOPY WITH LATERAL MENISECTOMY Left 11/10/2014   Procedure: LEFT KNEE ARTHROSCOPY PARTIAL LATERAL MENISECTOMY CHONDROPLASTY;  Surgeon: Hessie Dibble, MD;  Location: [facility];  Service: Orthopedics;  Laterality: Left;  NASAL SINUS SURGERY  11-13-2008   polyps removed   skin cancer removal     basal cell face   TONSILLECTOMY AND ADENOIDECTOMY  1967   VENOUS ABLATION  2015   Dr Kellie Simmering    Current Outpatient Medications  Medication Sig Dispense Refill   atorvastatin (LIPITOR) 40 MG tablet TAKE 1 TABLET ONCE DAILY **NEEDS APPOINTMENT FOR FUTURE REFILLS** 90 tablet 1   augmented betamethasone dipropionate (DIPROLENE-AF) 0.05 % ointment Apply 1 application topically daily.     azelastine (ASTELIN) 0.1 % nasal spray INSTILL 1 2 SPRAYS INTO EACH NOSTRIL TWICE A DAY     carvedilol (COREG) 12.5 MG tablet Take 1 tablet (12.5 mg total) by mouth 2 (two) times daily. 180 tablet 3   cetirizine (ZYRTEC) 10 MG  tablet Take 1 tablet by mouth as needed for allergies.     Continuous Blood Gluc Receiver (FREESTYLE LIBRE 14 DAY READER) DEVI 1 Units by Does not apply route daily. 1 each 0   Continuous Blood Gluc Sensor (FREESTYLE LIBRE 14 DAY SENSOR) MISC Check glucose daily 1 each 0   Cyanocobalamin (B-12) 2000 MCG TABS Take by mouth daily.     DULoxetine (CYMBALTA) 60 MG capsule TAKE 2 CAPSULES BY MOUTH AT BEDTIME 180 capsule 1   EPINEPHrine 0.3 mg/0.3 mL IJ SOAJ injection USE AS DIRECTED AS NEEDED     FARXIGA 5 MG TABS tablet TAKE 1 TABLET BY MOUTH EVERY DAY BEFORE BREAKFAST 30 tablet 3   HYDROcodone-acetaminophen (NORCO) 10-325 MG tablet Take 1 tablet by mouth daily as needed.     losartan (COZAAR) 50 MG tablet Take 1 tablet (50 mg total) by mouth daily. 90 tablet 1   Magnesium 400 MG TABS Take 400 mg by mouth 2 (two) times daily.     metFORMIN (GLUCOPHAGE) 500 MG tablet TAKE 4 TABLETS BY MOUTH EVERY DAY WITH A MEAL 360 tablet 1   montelukast (SINGULAIR) 10 MG tablet TAKE 1 TABLET BY MOUTH EVERYDAY AT BEDTIME 90 tablet 1   omeprazole (PRILOSEC) 40 MG capsule TAKE 1 CAPSULE BY MOUTH EVERY DAY 90 capsule 1   pregabalin (LYRICA) 50 MG capsule TAKE 2 CAPSULES BY MOUTH EVERY MORNING AND 3 CAPS AT BEDTIME     No current facility-administered medications for this visit.    Allergies:   Bactrim [sulfamethoxazole-trimethoprim], Sulfamethoxazole, Amoxicillin, and Cephalexin   Social History:  The patient  reports that she has never smoked. She has never used smokeless tobacco. She reports that she does not drink alcohol and does not use drugs.   Family History:  The patient's family history includes Alcohol abuse in her brother and brother; Arthritis in her brother; Colon polyps in her mother; Diabetes in her mother; Heart failure in her father; Hyperlipidemia in her father and mother; Lymphoma in her mother. ROS:  Please see the history of present illness.    All other systems are reviewed and otherwise  negative.   PHYSICAL EXAM:  VS:  There were no vitals taken for this visit. BMI: There is no height or weight on file to calculate BMI. Well nourished, well developed, in no acute distress HEENT: normocephalic, atraumatic Neck: no JVD, carotid bruits or masses Cardiac:   RRR; no significant murmurs, no rubs, or gallops Lungs:  CTA b/l, no wheezing, rhonchi or rales Abd: soft, nontender MS: no deformity or atrophy Ext:  no edema Skin: warm and dry, no rash Neuro:  No gross deficits appreciated Psych: euthymic mood, full affect    EKG:  not done today  03/14/21; TTE IMPRESSIONS   1. Left ventricular ejection fraction, by estimation, is 60 to 65%. The  left ventricle has normal function. The left ventricle has no regional  wall motion abnormalities. There is mild left ventricular hypertrophy.  Left ventricular diastolic parameters  were normal.   2. Right ventricular systolic function is normal. The right ventricular  size is normal. There is normal pulmonary artery systolic pressure.   3. The mitral valve is normal in structure. No evidence of mitral valve  regurgitation. No evidence of mitral stenosis.   4. The aortic valve is tricuspid. There is mild calcification of the  aortic valve. There is mild thickening of the aortic valve. Aortic valve  regurgitation is not visualized. Mild aortic valve sclerosis is present,  with no evidence of aortic valve  stenosis.   5. The inferior vena cava is normal in size with greater than 50%  respiratory variability, suggesting right atrial pressure of 3 mmHg.   Comparison(s): No significant change from prior study.    02/21/21: Stress myoview The left ventricular ejection fraction is normal (55-65%). Nuclear stress EF: 62%. There was no ST segment deviation noted during stress. The study is normal. This is a low risk study.   Recent Labs: 12/09/2021: ALT 19; BUN 15; Creatinine, Ser 0.85; Hemoglobin 12.3; Platelets 312.0; Potassium  4.1; Sodium 136; TSH 0.91  04/30/2021: Cholesterol 183; HDL 48.80; LDL Cholesterol 96; Total CHOL/HDL Ratio 4; Triglycerides 191.0; VLDL 38.2   CrCl cannot be calculated (Patient's most recent lab result is older than the maximum 21 days allowed.).   Wt Readings from Last 3 Encounters:  02/25/22 166 lb 12.8 oz (75.7 kg)  12/09/21 176 lb (79.8 kg)  12/06/21 176 lb (79.8 kg)     Other studies reviewed: Additional studies/records reviewed today include: summarized above  ASSESSMENT AND PLAN:  SVT Single episode years ago  HTN Slightly elevated today With hx of orthostatic symptoms, continue without changes Encouraged low salt, some weight loss,exercise She will monitor at home  Orthostatic lightheadedness Negative orthostatic vitals today resolved   Disposition: we can see her annually, sooner if needed   Current medicines are reviewed at length with the patient today.  The patient did not have any concerns regarding medicines.  Venetia Night, PA-C 03/10/2022 4:53 PM     [address] [place] [address] [phone] (office)  [phone] (fax)

## 2022-03-11 ENCOUNTER — Ambulatory Visit: Payer: 59 | Admitting: Physician Assistant

## 2022-03-11 ENCOUNTER — Encounter: Payer: Self-pay | Admitting: Physician Assistant

## 2022-03-11 VITALS — BP 138/82 | HR 80 | Ht 68.5 in | Wt 166.6 lb

## 2022-03-11 DIAGNOSIS — R42 Dizziness and giddiness: Secondary | ICD-10-CM

## 2022-03-11 DIAGNOSIS — I10 Essential (primary) hypertension: Secondary | ICD-10-CM

## 2022-03-11 DIAGNOSIS — I471 Supraventricular tachycardia, unspecified: Secondary | ICD-10-CM

## 2022-03-11 MED ORDER — LOSARTAN POTASSIUM 50 MG PO TABS: 50.0000 mg | ORAL_TABLET | Freq: Every day | ORAL | 3 refills | Status: DC

## 2022-03-11 NOTE — Patient Instructions (Signed)
Medication Instructions:   Your physician recommends that you continue on your current medications as directed. Please refer to the Current Medication list given to you today.  *If you need a refill on your cardiac medications before your next appointment, please call your pharmacy*   Lab Work: [place]   If you have labs (blood work) drawn today and your tests are completely normal, you will receive your results only by: [place] (if you have MyChart) OR A paper copy in the mail If you have any lab test that is abnormal or we need to change your treatment, we will call you to review the results.   Testing/Procedures: NONE ORDERED  TODAY   Follow-Up: At [facility], you and your health needs are our priority.  As part of our continuing mission to provide you with exceptional heart care, we have created designated Provider Care Teams.  These Care Teams include your primary Cardiologist (physician) and Advanced Practice Providers (APPs -  Physician Assistants and Nurse Practitioners) who all work together to provide you with the care you need, when you need it.  We recommend signing up for the patient portal called "MyChart".  Sign up information is provided on this After Visit Summary.  MyChart is used to connect with patients for Virtual Visits (Telemedicine).  Patients are able to view lab/test results, encounter notes, upcoming appointments, etc.  Non-urgent messages can be sent to your provider as well.   To learn more about what you can do with MyChart, go to [email or web address].    Your next appointment:   1 year(s)  The format for your next appointment:   In Person  Provider:   Tommye Standard, PA-C    Other Instructions   Important Information About Sugar

## 2022-03-13 ENCOUNTER — Other Ambulatory Visit: Payer: Self-pay | Admitting: Internal Medicine

## 2022-05-16 ENCOUNTER — Other Ambulatory Visit: Payer: Self-pay | Admitting: Internal Medicine

## 2022-06-06 ENCOUNTER — Other Ambulatory Visit: Payer: Self-pay | Admitting: Internal Medicine

## 2022-06-11 ENCOUNTER — Other Ambulatory Visit: Payer: Self-pay | Admitting: Internal Medicine

## 2022-06-26 ENCOUNTER — Other Ambulatory Visit: Payer: Self-pay | Admitting: Internal Medicine

## 2022-06-26 DIAGNOSIS — Z1231 Encounter for screening mammogram for malignant neoplasm of breast: Secondary | ICD-10-CM

## 2022-06-28 ENCOUNTER — Other Ambulatory Visit: Payer: Self-pay | Admitting: Internal Medicine

## 2022-07-03 NOTE — Telephone Encounter (Signed)
Contact patient. Husband Herbie [place] on [facility]) answered. Inform him to have patient call us back to confirm her Losartan P. Dosage for the refill request.

## 2022-07-04 NOTE — Telephone Encounter (Signed)
Spoke to patient yesterday. Patient reports her Card. PA have her on '50mg'$  of Losartan. Would like her to take 1/2 of '100mg'$ . She said since it is hard to break the '100mg'$  in half. She has been taking '100mg'$  and is feeling fine. But have not follow up with the provider yet.   Advise patient to contact her cardiology office.

## 2022-07-17 ENCOUNTER — Ambulatory Visit
Admission: RE | Admit: 2022-07-17 | Discharge: 2022-07-17 | Disposition: A | Discharge status: Home / Self Care | Payer: 59 | Source: Ambulatory Visit | Attending: Internal Medicine | Admitting: Internal Medicine

## 2022-07-17 ENCOUNTER — Ambulatory Visit: Payer: 59

## 2022-07-17 DIAGNOSIS — Z1231 Encounter for screening mammogram for malignant neoplasm of breast: Secondary | ICD-10-CM

## 2022-08-01 ENCOUNTER — Encounter: Payer: Self-pay | Admitting: Internal Medicine

## 2022-08-10 ENCOUNTER — Other Ambulatory Visit: Payer: Self-pay | Admitting: Internal Medicine

## 2022-09-09 ENCOUNTER — Other Ambulatory Visit: Payer: Self-pay | Admitting: Internal Medicine

## 2022-09-22 ENCOUNTER — Other Ambulatory Visit: Payer: Self-pay | Admitting: Internal Medicine

## 2022-09-30 ENCOUNTER — Telehealth: Payer: Self-pay | Admitting: *Deleted

## 2022-09-30 NOTE — Telephone Encounter (Signed)
   Name: Holly Beasley  DOB: 1950/12/04  MRN: [id number]  Primary Cardiologist: None   Preoperative team, please contact this patient and set up a phone call appointment for further preoperative risk assessment. Please obtain consent and complete medication review. Thank you for your help.  I confirm that guidance regarding antiplatelet and oral anticoagulation therapy has been completed and, if necessary, noted below (none requested).    [name], NP 09/30/2022, 2:32 PM [place]

## 2022-09-30 NOTE — Telephone Encounter (Signed)
   Pre-operative Risk Assessment    Patient Name: Holly Beasley  DOB: 01-Jun-1951 MRN: [id number]      Request for Surgical Clearance    Procedure:   LEFT KNEE ARTHROPLASTY  Date of Surgery:  Clearance TBD                                 Surgeon:  DR. Melrose Nakayama Surgeon's Group or Practice Name:  [facility] Phone number:  [phone] ATTN: [place] Fax number:  [phone]   Type of Clearance Requested:   - Medical ; NO MEDICATIONS ARE LISTED AS NEEDING TO BE HELD   Type of Anesthesia:  Spinal   Additional requests/questions:    Jiles Prows   09/30/2022, 1:17 PM

## 2022-10-01 ENCOUNTER — Other Ambulatory Visit: Payer: Self-pay | Admitting: Internal Medicine

## 2022-10-01 NOTE — Telephone Encounter (Signed)
Tried to call the pt but no answer.

## 2022-10-02 NOTE — Telephone Encounter (Signed)
2nd attempt to reach pt regarding surgical clearance and the need for a tele visit, no answer / no machine.

## 2022-10-03 NOTE — Telephone Encounter (Signed)
Left vm for pt to call the office to schedule a tele pre op appt.

## 2022-10-04 ENCOUNTER — Other Ambulatory Visit: Payer: Self-pay | Admitting: Internal Medicine

## 2022-10-07 ENCOUNTER — Telehealth: Payer: Self-pay

## 2022-10-07 NOTE — Telephone Encounter (Signed)
  Pt is returning call. She said, to call her back on [phone]

## 2022-10-07 NOTE — Telephone Encounter (Signed)
Noted forms were recived

## 2022-10-07 NOTE — Telephone Encounter (Signed)
  Patient Consent for Virtual Visit        Holly Beasley has provided verbal consent on 10/07/2022 for a virtual visit (video or telephone).   CONSENT FOR VIRTUAL VISIT FOR:  Holly Beasley  By participating in this virtual visit I agree to the following:  I hereby voluntarily request, consent and authorize [facility] and its employed or contracted physicians, physician assistants, nurse practitioners or other licensed health care professionals (the Practitioner), to provide me with telemedicine health care services (the "Services") as deemed necessary by the treating Practitioner. I acknowledge and consent to receive the Services by the Practitioner via telemedicine. I understand that the telemedicine visit will involve communicating with the Practitioner through live audiovisual communication technology and the disclosure of certain medical information by electronic transmission. I acknowledge that I have been given the opportunity to request an in-person assessment or other available alternative prior to the telemedicine visit and am voluntarily participating in the telemedicine visit.  I understand that I have the right to withhold or withdraw my consent to the use of telemedicine in the course of my care at any time, without affecting my right to future care or treatment, and that the Practitioner or I may terminate the telemedicine visit at any time. I understand that I have the right to inspect all information obtained and/or recorded in the course of the telemedicine visit and may receive copies of available information for a reasonable fee.  I understand that some of the potential risks of receiving the Services via telemedicine include:  Delay or interruption in medical evaluation due to technological equipment failure or disruption; Information transmitted may not be sufficient (e.g. poor resolution of images) to allow for appropriate medical decision making by the  Practitioner; and/or  In rare instances, security protocols could fail, causing a breach of personal health information.  Furthermore, I acknowledge that it is my responsibility to provide information about my medical history, conditions and care that is complete and accurate to the best of my ability. I acknowledge that Practitioner's advice, recommendations, and/or decision may be based on factors not within their control, such as incomplete or inaccurate data provided by me or distortions of diagnostic images or specimens that may result from electronic transmissions. I understand that the practice of medicine is not an exact science and that Practitioner makes no warranties or guarantees regarding treatment outcomes. I acknowledge that a copy of this consent can be made available to me via my patient portal ([facility]), or I can request a printed copy by calling the office of [facility].    I understand that my insurance will be billed for this visit.   I have read or had this consent read to me. I understand the contents of this consent, which adequately explains the benefits and risks of the Services being provided via telemedicine.  I have been provided ample opportunity to ask questions regarding this consent and the Services and have had my questions answered to my satisfaction. I give my informed consent for the services to be provided through the use of telemedicine in my medical care

## 2022-10-07 NOTE — Telephone Encounter (Signed)
Last OV 02/25/22 for acute reasons. Pt advised that in office appt will be needed with one of the other providers since PCP is out of office.  Appt scheduled on 10/14/22 with Dr Elease Hashimoto.  Form given to provider's CMA.

## 2022-10-07 NOTE — Telephone Encounter (Signed)
Returned call to patient and explained reason for call. Patient is scheduled for an appointment on 10/16/21, reviewed medications and consent is in chart. Patient verbalized understanding and all (if any) questions were answered.

## 2022-10-12 ENCOUNTER — Other Ambulatory Visit: Payer: Self-pay | Admitting: Internal Medicine

## 2022-10-14 ENCOUNTER — Encounter: Payer: Self-pay | Admitting: Family Medicine

## 2022-10-14 ENCOUNTER — Ambulatory Visit (INDEPENDENT_AMBULATORY_CARE_PROVIDER_SITE_OTHER): Payer: 59 | Admitting: Family Medicine

## 2022-10-14 ENCOUNTER — Ambulatory Visit: Payer: 59 | Attending: Internal Medicine | Admitting: Nurse Practitioner

## 2022-10-14 VITALS — BP 132/72 | HR 85 | Temp 97.6°F | Ht 68.5 in | Wt 170.3 lb

## 2022-10-14 DIAGNOSIS — Z01818 Encounter for other preprocedural examination: Secondary | ICD-10-CM | POA: Diagnosis not present

## 2022-10-14 DIAGNOSIS — I10 Essential (primary) hypertension: Secondary | ICD-10-CM

## 2022-10-14 DIAGNOSIS — Z01810 Encounter for preprocedural cardiovascular examination: Secondary | ICD-10-CM | POA: Diagnosis not present

## 2022-10-14 DIAGNOSIS — E1165 Type 2 diabetes mellitus with hyperglycemia: Secondary | ICD-10-CM | POA: Diagnosis not present

## 2022-10-14 LAB — POCT GLYCOSYLATED HEMOGLOBIN (HGB A1C): Hemoglobin A1C: 6.6 % — AB (ref 4.0–5.6)

## 2022-10-14 NOTE — Patient Instructions (Signed)
A1C is improved to 6.6% which is greatly improved!

## 2022-10-14 NOTE — Progress Notes (Signed)
Established Patient Office Visit  Subjective   Patient ID: Holly Beasley, female    DOB: [birth year]  Age: 72 y.o. MRN: [id number]  Chief Complaint  Patient presents with   Pre-op Exam    HPI   Holly Beasley is seen for preoperative clearance for left total knee replacement.  She actually had virtual visit with cardiology this morning and was cleared from a cardiology standpoint.  She has history of hypertension, obstructive sleep apnea, type 2 diabetes, hyperlipidemia.  No history of CVA.  She had myocardial perfusion imaging in 2022 which was low risk.  Also had echocardiogram 6/22 which was unremarkable.  She denies any recent chest pains.  Does not monitor blood sugars regularly.  She takes metformin and apparently had addition of Farxiga approximately a year ago.  Last A1c on record was 12-09-2021 which was 7.8.  Past Medical History:  Diagnosis Date   Depression    Diabetes mellitus without complication (HCC)    GERD (gastroesophageal reflux disease)    History of ETOH abuse    recovering  in remission   Hyperlipidemia    Hypertension    Osteoarthritis    Osteopenia    dexa -1.7 h -1.35    Scoliosis    leg length discrepancy   Skin cancer    basal cell nose   Sleep apnea    did not meet criteria for cpap-has mouthpiece not using per pt   SVT (supraventricular tachycardia) 08/15/2016   Varicose veins    Wears contact lenses    Past Surgical History:  Procedure Laterality Date   BREAST BIOPSY     2003-rt   CATARACT EXTRACTION, BILATERAL  2019   COLONOSCOPY W/ POLYPECTOMY  6195,0932   precancerous   2004   DENTAL SURGERY  2015   bone graft    KNEE ARTHROSCOPY WITH LATERAL MENISECTOMY Left 11/10/2014   Procedure: LEFT KNEE ARTHROSCOPY PARTIAL LATERAL MENISECTOMY CHONDROPLASTY;  Surgeon: Hessie Dibble, MD;  Location: [facility];  Service: Orthopedics;  Laterality: Left;   NASAL SINUS SURGERY  11-13-2008   polyps removed   skin cancer removal      basal cell face   TONSILLECTOMY AND ADENOIDECTOMY  1967   VENOUS ABLATION  2015   Dr Kellie Simmering    reports that she has never smoked. She has never used smokeless tobacco. She reports that she does not drink alcohol and does not use drugs. family history includes Alcohol abuse in her brother and brother; Arthritis in her brother; Colon polyps in her mother; Diabetes in her mother; Heart failure in her father; Hyperlipidemia in her father and mother; Lymphoma in her mother. Allergies  Allergen Reactions   Bactrim [Sulfamethoxazole-Trimethoprim]     Rash and stomach pain   Sulfamethoxazole     Rash and stomach pain   Amoxicillin Rash    REACTION: unknown   Cephalexin     REACTION: unknown    Review of Systems  Constitutional:  Negative for fever and malaise/fatigue.  Eyes:  Negative for blurred vision.  Respiratory:  Negative for shortness of breath.   Cardiovascular:  Negative for chest pain.  Gastrointestinal:  Negative for abdominal pain.  Neurological:  Negative for dizziness, weakness and headaches.      Objective:     BP 132/72 (BP Location: Left Arm, Cuff Size: Normal)   Pulse 85   Temp 97.6 F (36.4 C) (Oral)   Ht 5' 8.5" (1.74 m)   Wt 170 lb 4.8 oz (  77.2 kg)   SpO2 97%   BMI 25.52 kg/m  BP Readings from Last 3 Encounters:  10/14/22 132/72  03/11/22 138/82  02/25/22 134/80   Wt Readings from Last 3 Encounters:  10/14/22 170 lb 4.8 oz (77.2 kg)  03/11/22 166 lb 9.6 oz (75.6 kg)  02/25/22 166 lb 12.8 oz (75.7 kg)      Physical Exam Vitals reviewed.  Constitutional:      Appearance: She is well-developed.  Eyes:     Pupils: Pupils are equal, round, and reactive to light.  Neck:     Thyroid: No thyromegaly.     Vascular: No JVD.     Comments: No carotid bruits Cardiovascular:     Rate and Rhythm: Normal rate and regular rhythm.     Heart sounds:     No gallop.  Pulmonary:     Effort: Pulmonary effort is normal. No respiratory distress.     Breath  sounds: Normal breath sounds. No wheezing or rales.  Musculoskeletal:     Cervical back: Neck supple.     Right lower leg: No edema.     Left lower leg: No edema.  Neurological:     Mental Status: She is alert.      Results for orders placed or performed in visit on 10/14/22  POCT glycosylated hemoglobin (Hb A1C)  Result Value Ref Range   Hemoglobin A1C 6.6 (A) 4.0 - 5.6 %   HbA1c POC (<> result, manual entry)     HbA1c, POC (prediabetic range)     HbA1c, POC (controlled diabetic range)      Last CBC Lab Results  Component Value Date   WBC 7.6 12/09/2021   HGB 12.3 12/09/2021   HCT 37.1 12/09/2021   MCV 85.2 12/09/2021   MCH 31.0 08/15/2016   RDW 16.1 (H) 12/09/2021   PLT 312.0 24/23/5361   Last metabolic panel Lab Results  Component Value Date   GLUCOSE 183 (H) 12/09/2021   NA 136 12/09/2021   K 4.1 12/09/2021   CL 101 12/09/2021   CO2 30 12/09/2021   BUN 15 12/09/2021   CREATININE 0.85 12/09/2021   GFRNONAA >60 08/15/2016   CALCIUM 10.2 12/09/2021   PROT 7.4 12/09/2021   ALBUMIN 4.3 12/09/2021   BILITOT 0.6 12/09/2021   ALKPHOS 77 12/09/2021   AST 18 12/09/2021   ALT 19 12/09/2021   ANIONGAP 9 08/15/2016   Last lipids Lab Results  Component Value Date   CHOL 183 04/30/2021   HDL 48.80 04/30/2021   LDLCALC 96 04/30/2021   LDLDIRECT 171.0 10/10/2016   TRIG 191.0 (H) 04/30/2021   CHOLHDL 4 04/30/2021   Last hemoglobin A1c Lab Results  Component Value Date   HGBA1C 6.6 (A) 10/14/2022      The 10-year ASCVD risk score (Arnett DK, et al., 2019) is: 26.6%    Assessment & Plan:   #1 preoperative clearance for left total knee replacement.  Generally doing well.  No history of known CAD.  She does have type 2 diabetes which is well-controlled as below.  She has hypertension which is stable.  No recent chest pains.  Cleared by cardiology earlier today.  #2 hypertension.  Initial reading up here.  She takes losartan and carvedilol.  Repeat reading left  arm seated after rest 132/72.  Continue current medications  #3 type 2 diabetes.  Improved control with A1c 6.6%.  Continue metformin and Farxiga   No follow-ups on file.    Carolann Littler, MD

## 2022-10-14 NOTE — Progress Notes (Signed)
Virtual Visit via Telephone Note   Because of Holly Beasley co-morbid illnesses, she is at least at moderate risk for complications without adequate follow up.  This format is felt to be most appropriate for this patient at this time.  The patient did not have access to video technology/had technical difficulties with video requiring transitioning to audio format only (telephone).  All issues noted in this document were discussed and addressed.  No physical exam could be performed with this format.  Please refer to the patient's chart for her consent to telehealth for Holly Beasley.  Evaluation Performed:  Preoperative cardiovascular risk assessment _____________   Date:  10/14/2022   Patient ID:  Holly Beasley, DOB 02-07-1951, MRN [id number] Patient Location:  Home Provider location:   Office  Primary Care Provider:  Burnis Medin, MD Primary Cardiologist:  None  Chief Complaint / Patient Profile   72 y.o. y/o female with a h/o HTN, HLD, DM, GERD, SVT (short RP, adenosine sensitive).  who is pending left knee arthroplasty and presents today for telephonic preoperative cardiovascular risk assessment.  History of Present Illness    Holly Beasley is a 72 y.o. female who presents via audio/video conferencing for a telehealth visit today.  Pt was last seen in cardiology clinic on 03/11/22 by Tommye Standard, PA.  At that time Holly Beasley was doing well with no complaints of orthostatic symptoms with reduction of losartan.  Her last ischemic evaluation was 02/21/2021 by stress Myoview that was low risk..  The patient is now pending procedure as outlined above. Since her last visit, she reports that she is doing well with no new cardiac complaints or health concerns.  She reports that her orthostatic symptoms and vertigo have all resolved.  She equates these symptoms to possible long COVID.  Past Medical History    Past Medical History:  Diagnosis Date   Depression     Diabetes mellitus without complication (HCC)    GERD (gastroesophageal reflux disease)    History of ETOH abuse    recovering  in remission   Hyperlipidemia    Hypertension    Osteoarthritis    Osteopenia    dexa -1.7 h -1.35    Scoliosis    leg length discrepancy   Skin cancer    basal cell nose   Sleep apnea    did not meet criteria for cpap-has mouthpiece not using per pt   SVT (supraventricular tachycardia) 08/15/2016   Varicose veins    Wears contact lenses    Past Surgical History:  Procedure Laterality Date   BREAST BIOPSY     2003-rt   CATARACT EXTRACTION, BILATERAL  2019   COLONOSCOPY W/ POLYPECTOMY  3818,2993   precancerous   2004   DENTAL SURGERY  2015   bone graft    KNEE ARTHROSCOPY WITH LATERAL MENISECTOMY Left 11/10/2014   Procedure: LEFT KNEE ARTHROSCOPY PARTIAL LATERAL MENISECTOMY CHONDROPLASTY;  Surgeon: Hessie Dibble, MD;  Location: [facility];  Service: Orthopedics;  Laterality: Left;   NASAL SINUS SURGERY  11-13-2008   polyps removed   skin cancer removal     basal cell face   TONSILLECTOMY AND ADENOIDECTOMY  1967   VENOUS ABLATION  2015   Dr Kellie Simmering    Allergies  Allergies  Allergen Reactions   Bactrim [Sulfamethoxazole-Trimethoprim]     Rash and stomach pain   Sulfamethoxazole     Rash and stomach pain   Amoxicillin Rash  REACTION: unknown   Cephalexin     REACTION: unknown    Home Medications    Prior to Admission medications   Medication Sig Start Date End Date Taking? Authorizing Provider  atorvastatin (LIPITOR) 40 MG tablet TAKE 1 TABLET BY MOUTH DAILY **NEEDS APPOINTMENT FOR FUTURE REFILLS** 08/13/22   Panosh, Standley Brooking, MD  azelastine (ASTELIN) 0.1 % nasal spray INSTILL 1 2 SPRAYS INTO EACH NOSTRIL TWICE A DAY 09/30/19   [provider]  carvedilol (COREG) 12.5 MG tablet TAKE 1 TABLET BY MOUTH 2 TIMES DAILY. 03/13/22   Allred, Jeneen Rinks, MD  cetirizine (ZYRTEC) 10 MG tablet Take 1 tablet by mouth as needed for  allergies.    [provider]  Continuous Blood Gluc Receiver (FREESTYLE LIBRE 14 DAY READER) DEVI 1 Units by Does not apply route daily. Patient not taking: Reported on 10/07/2022 12/17/21   Panosh, Standley Brooking, MD  Continuous Blood Gluc Sensor (FREESTYLE LIBRE 14 DAY SENSOR) MISC Check glucose daily Patient not taking: Reported on 10/07/2022 12/17/21   Panosh, Standley Brooking, MD  Cyanocobalamin (B-12) 2000 MCG TABS Take by mouth daily.    [provider]  dapagliflozin propanediol (FARXIGA) 5 MG TABS tablet Take 1 tablet (5 mg total) by mouth daily. SCHEDULE ANNUAL APPT FOR FUTURE REFILLS 10/06/22   Panosh, Standley Brooking, MD  DULoxetine (CYMBALTA) 60 MG capsule TAKE 2 CAPSULES BY MOUTH AT BEDTIME 09/09/22   Panosh, Standley Brooking, MD  EPINEPHrine 0.3 mg/0.3 mL IJ SOAJ injection USE AS DIRECTED AS NEEDED 09/30/19   [provider]  losartan (COZAAR) 50 MG tablet Take 1 tablet (50 mg total) by mouth daily. 03/11/22   Baldwin Jamaica, PA-C  Magnesium 400 MG TABS Take 400 mg by mouth 2 (two) times daily.    [provider]  metFORMIN (GLUCOPHAGE) 500 MG tablet TAKE 4 TABLETS BY MOUTH EVERY DAY WITH A MEAL 10/01/22   Panosh, Standley Brooking, MD  montelukast (SINGULAIR) 10 MG tablet TAKE 1 TABLET BY MOUTH EVERYDAY AT BEDTIME 09/22/22   Panosh, Standley Brooking, MD  omeprazole (PRILOSEC) 40 MG capsule TAKE 1 CAPSULE BY MOUTH EVERY DAY 10/01/22   Panosh, Standley Brooking, MD  pregabalin (LYRICA) 50 MG capsule TAKE 2 CAPSULES BY MOUTH EVERY MORNING AND 3 CAPS AT BEDTIME 04/07/19   [provider]    Physical Exam    Vital Signs:  Holly Beasley does not have vital signs available for review today.  Given telephonic nature of communication, physical exam is limited. AAOx3. NAD. Normal affect.  Speech and respirations are unlabored.  Accessory Clinical Findings    None  Assessment & Plan    1. { The patient affirms she has been doing well without any new cardiac symptoms. They are able to achieve 4  METS without cardiac limitations. Therefore, based on ACC/AHA guidelines, the patient would be at acceptable risk for the planned procedure without further cardiovascular testing. The patient was advised that if she develops new symptoms prior to surgery to contact our office to arrange for a follow-up visit, and she verbalized understanding.   Ms. Seres perioperative risk of a major cardiac event is  % according to the Revised Cardiac Risk Index (RCRI).  Therefore, she is at low risk for perioperative complications.   Her functional capacity is good at 4.31 METs according to the Duke Activity Status Index (DASI). Recommendations: According to ACC/AHA guidelines, no further cardiovascular testing needed.  The patient may proceed to surgery at acceptable risk.  Antiplatelet and/or Anticoagulation Recommendations: N/A  A copy of this note will be routed to requesting surgeon.  Time:   Today, I have spent 5 minutes with the patient with telehealth technology discussing medical history, symptoms, and management plan.     Mable Fill, Marissa Nestle, NP  10/14/2022, 9:27 AM

## 2022-10-28 ENCOUNTER — Encounter: Payer: Self-pay | Admitting: Internal Medicine

## 2022-11-11 ENCOUNTER — Other Ambulatory Visit: Payer: Self-pay | Admitting: Internal Medicine

## 2022-11-18 ENCOUNTER — Other Ambulatory Visit: Payer: Self-pay | Admitting: Internal Medicine

## 2022-11-25 ENCOUNTER — Ambulatory Visit (AMBULATORY_SURGERY_CENTER): Payer: 59 | Admitting: *Deleted

## 2022-11-25 VITALS — Ht 68.5 in | Wt 170.0 lb

## 2022-11-25 DIAGNOSIS — Z86010 Personal history of colonic polyps: Secondary | ICD-10-CM

## 2022-11-25 NOTE — Progress Notes (Signed)
No egg or soy allergy known to patient  No issues known to pt with past sedation with any surgeries or procedures Patient denies ever being told they had issues or difficulty with intubation  No FH of Malignant Hyperthermia Pt is not on diet pills Pt is not on  home 02  Pt is not on blood thinners  Pt denies issues with constipation  No A fib or A flutter Have any cardiac testing pending--no Pt instructed to use [employer] or GoodRx for a price reduction on prep   Sample sheet of over the counter items to purchase for prep sent with packet.   Patient's chart reviewed by Osvaldo Angst CNRA prior to previsit and patient appropriate for the [facility].  Previsit completed and red dot placed by patient's name on their procedure day (on provider's schedule).

## 2022-11-26 ENCOUNTER — Encounter: Payer: Self-pay | Admitting: Internal Medicine

## 2022-12-07 ENCOUNTER — Encounter: Payer: Self-pay | Admitting: Certified Registered Nurse Anesthetist

## 2022-12-09 ENCOUNTER — Encounter: Payer: Self-pay | Admitting: Internal Medicine

## 2022-12-09 ENCOUNTER — Ambulatory Visit (AMBULATORY_SURGERY_CENTER): Payer: 59 | Admitting: Internal Medicine

## 2022-12-09 VITALS — BP 120/65 | HR 65 | Temp 97.5°F | Resp 14 | Ht 68.5 in | Wt 170.0 lb

## 2022-12-09 DIAGNOSIS — Z09 Encounter for follow-up examination after completed treatment for conditions other than malignant neoplasm: Secondary | ICD-10-CM | POA: Diagnosis present

## 2022-12-09 DIAGNOSIS — D125 Benign neoplasm of sigmoid colon: Secondary | ICD-10-CM

## 2022-12-09 DIAGNOSIS — D124 Benign neoplasm of descending colon: Secondary | ICD-10-CM

## 2022-12-09 DIAGNOSIS — Z86010 Personal history of colonic polyps: Secondary | ICD-10-CM | POA: Diagnosis not present

## 2022-12-09 DIAGNOSIS — K635 Polyp of colon: Secondary | ICD-10-CM

## 2022-12-09 MED ORDER — SODIUM CHLORIDE 0.9 % IV SOLN: 500.0000 mL | INTRAVENOUS | Status: DC

## 2022-12-09 NOTE — Progress Notes (Signed)
[facility] History and Physical   Primary Care Physician:  Burnis Medin, MD   Reason for Procedure:   Hx colon polyps  Plan:    colonoscopy     HPI: Holly Beasley is a 72 y.o. female w/ hx colon polyps  2010 single adenoma 05/2019 16 polyps max 8 mm, mix of adenomas, inflammatory and hyperplastic recall 2023 Past Medical History:  Diagnosis Date   Allergy    seasonal,take shots   Cataract    bilateral,removed 2019   Depression    Diabetes mellitus without complication (HCC)    GERD (gastroesophageal reflux disease)    History of ETOH abuse    recovering  in remission   Hyperlipidemia    Hypertension    Osteoarthritis    Osteopenia    dexa -1.7 h -1.35    Scoliosis    leg length discrepancy   Skin cancer    basal cell nose   Sleep apnea    did not meet criteria for cpap-has mouthpiece not using per pt   Substance abuse ([place])    etoh 40 years ago   SVT (supraventricular tachycardia) 08/15/2016   Varicose veins    Wears contact lenses     Past Surgical History:  Procedure Laterality Date   ANTERIOR FUSION CERVICAL SPINE     [phone]   BREAST BIOPSY     2003-rt   CATARACT EXTRACTION, BILATERAL  2019   COLONOSCOPY W/ POLYPECTOMY  [id number]   precancerous   2004   DENTAL SURGERY  2015   bone graft    KNEE ARTHROSCOPY WITH LATERAL MENISECTOMY Left 11/10/2014   Procedure: LEFT KNEE ARTHROSCOPY PARTIAL LATERAL MENISECTOMY CHONDROPLASTY;  Surgeon: Hessie Dibble, MD;  Location: [facility];  Service: Orthopedics;  Laterality: Left;   NASAL SINUS SURGERY  11/13/2008   polyps removed   skin cancer removal     basal cell nose 2004   TONSILLECTOMY AND ADENOIDECTOMY  1960   VENOUS ABLATION Left 2015   Dr Kellie Simmering    Prior to Admission medications   Medication Sig Start Date End Date Taking? Authorizing Provider  atorvastatin (LIPITOR) 40 MG tablet TAKE 1 TABLET BY MOUTH EVERY DAY 11/18/22  Yes Dutch Quint B, FNP  azelastine  (ASTELIN) 0.1 % nasal spray INSTILL 1 2 SPRAYS INTO EACH NOSTRIL TWICE A DAY 09/30/19  Yes [provider]  carvedilol (COREG) 12.5 MG tablet TAKE 1 TABLET BY MOUTH 2 TIMES DAILY. 03/13/22  Yes Allred, Jeneen Rinks, MD  cetirizine (ZYRTEC) 10 MG tablet Take 1 tablet by mouth as needed for allergies.   Yes [provider]  Cholecalciferol (VITAMIN D3) 25 MCG (1000 UT) capsule Take 1,000 Units by mouth daily.   Yes [provider]  Cyanocobalamin (B-12) 2000 MCG TABS Take by mouth daily.   Yes [provider]  dapagliflozin propanediol (FARXIGA) 5 MG TABS tablet Take 1 tablet (5 mg total) by mouth daily. SCHEDULE ANNUAL APPT FOR FUTURE REFILLS 10/06/22  Yes Panosh, Standley Brooking, MD  DULoxetine (CYMBALTA) 60 MG capsule TAKE 2 CAPSULES BY MOUTH AT BEDTIME 09/09/22  Yes Panosh, Standley Brooking, MD  EPINEPHrine 0.3 mg/0.3 mL IJ SOAJ injection  09/30/19  Yes [provider]  losartan (COZAAR) 100 MG tablet TAKE 1 TABLET BY MOUTH EVERY DAY 11/18/22  Yes Dutch Quint B, FNP  Magnesium 400 MG TABS Take 400 mg by mouth daily.   Yes [provider]  metFORMIN (GLUCOPHAGE) 500 MG tablet TAKE 4 TABLETS BY  MOUTH EVERY DAY WITH A MEAL 11/13/22  Yes Panosh, Standley Brooking, MD  montelukast (SINGULAIR) 10 MG tablet TAKE 1 TABLET BY MOUTH EVERYDAY AT BEDTIME 11/18/22  Yes Dutch Quint B, FNP  Multiple Vitamins-Minerals (MULTIVITAMIN GUMMIES ADULTS PO) Take by mouth daily. Take 2 daily   Yes [provider]  omeprazole (PRILOSEC) 40 MG capsule TAKE 1 CAPSULE BY MOUTH EVERY DAY 11/18/22  Yes Dutch Quint B, FNP  Continuous Blood Gluc Receiver (FREESTYLE LIBRE 14 DAY READER) DEVI 1 Units by Does not apply route daily. Patient not taking: Reported on 12/09/2022 12/17/21   Panosh, Standley Brooking, MD  Continuous Blood Gluc Sensor (FREESTYLE LIBRE 14 DAY SENSOR) MISC Check glucose daily Patient not taking: Reported on 12/09/2022 12/17/21   Panosh, Standley Brooking, MD  HYDROcodone-acetaminophen (NORCO) 10-325 MG  tablet Take 1 tablet by mouth as needed. Take rarely    [provider]    Current Outpatient Medications  Medication Sig Dispense Refill   atorvastatin (LIPITOR) 40 MG tablet TAKE 1 TABLET BY MOUTH EVERY DAY 90 tablet 0   azelastine (ASTELIN) 0.1 % nasal spray INSTILL 1 2 SPRAYS INTO EACH NOSTRIL TWICE A DAY     carvedilol (COREG) 12.5 MG tablet TAKE 1 TABLET BY MOUTH 2 TIMES DAILY. 180 tablet 3   cetirizine (ZYRTEC) 10 MG tablet Take 1 tablet by mouth as needed for allergies.     Cholecalciferol (VITAMIN D3) 25 MCG (1000 UT) capsule Take 1,000 Units by mouth daily.     Cyanocobalamin (B-12) 2000 MCG TABS Take by mouth daily.     dapagliflozin propanediol (FARXIGA) 5 MG TABS tablet Take 1 tablet (5 mg total) by mouth daily. SCHEDULE ANNUAL APPT FOR FUTURE REFILLS 30 tablet 3   DULoxetine (CYMBALTA) 60 MG capsule TAKE 2 CAPSULES BY MOUTH AT BEDTIME 180 capsule 1   EPINEPHrine 0.3 mg/0.3 mL IJ SOAJ injection      losartan (COZAAR) 100 MG tablet TAKE 1 TABLET BY MOUTH EVERY DAY 90 tablet 1   Magnesium 400 MG TABS Take 400 mg by mouth daily.     metFORMIN (GLUCOPHAGE) 500 MG tablet TAKE 4 TABLETS BY MOUTH EVERY DAY WITH A MEAL 360 tablet 1   montelukast (SINGULAIR) 10 MG tablet TAKE 1 TABLET BY MOUTH EVERYDAY AT BEDTIME 90 tablet 0   Multiple Vitamins-Minerals (MULTIVITAMIN GUMMIES ADULTS PO) Take by mouth daily. Take 2 daily     omeprazole (PRILOSEC) 40 MG capsule TAKE 1 CAPSULE BY MOUTH EVERY DAY 90 capsule 0   Continuous Blood Gluc Receiver (FREESTYLE LIBRE 14 DAY READER) DEVI 1 Units by Does not apply route daily. (Patient not taking: Reported on 12/09/2022) 1 each 0   Continuous Blood Gluc Sensor (FREESTYLE LIBRE 14 DAY SENSOR) MISC Check glucose daily (Patient not taking: Reported on 12/09/2022) 1 each 0   HYDROcodone-acetaminophen (NORCO) 10-325 MG tablet Take 1 tablet by mouth as needed. Take rarely     Current Facility-Administered Medications  Medication Dose Route Frequency  Provider Last Rate Last Admin   0.9 %  sodium chloride infusion  500 mL Intravenous Continuous Gatha Mayer, MD        Allergies as of 12/09/2022 - Review Complete 12/09/2022  Allergen Reaction Noted   Bactrim [sulfamethoxazole-trimethoprim]  09/01/2016   Sulfamethoxazole  10/30/2006   Meloxicam  11/25/2022   Amoxicillin Rash 10/30/2006   Cephalexin  10/30/2006    Family History  Problem Relation Age of Onset   Lymphoma Mother    Diabetes Mother  Hyperlipidemia Mother    Colon polyps Mother    Heart failure Father    Hyperlipidemia Father    Arthritis Brother    Alcohol abuse Brother    Alcohol abuse Brother    Colon cancer Neg Hx    Esophageal cancer Neg Hx    Rectal cancer Neg Hx    Stomach cancer Neg Hx    Crohn's disease Neg Hx    Ulcerative colitis Neg Hx     Social History   Socioeconomic History   Marital status: Married    Spouse name: Not on file   Number of children: Not on file   Years of education: Not on file   Highest education level: Not on file  Occupational History   Not on file  Tobacco Use   Smoking status: Never   Smokeless tobacco: Never  Vaping Use   Vaping Use: Never used  Substance and Sexual Activity   Alcohol use: No    Alcohol/week: 0.0 standard drinks of alcohol    Comment: recovering-1983   Drug use: No   Sexual activity: Not on file  Other Topics Concern   Not on file  Social History Narrative   2 cats   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Educational psychologist   Married   ETOH recovering   [place] [employer]    Has family in [place]         Social Determinants of Health   Financial Resource Strain: Not on file  Food Insecurity: Not on file  Transportation Needs: Not on file  Physical Activity: Not on file  Stress: Not on file  Social Connections: Not on file  Intimate Partner Violence: Not on file    Review of Systems:  All other review of systems  negative except as mentioned in the HPI.  Physical Exam: Vital signs BP 126/74   Pulse 72   Temp (!) 97.5 F (36.4 C) (Temporal)   Resp 10   Ht 5' 8.5" (1.74 m)   Wt 170 lb (77.1 kg)   SpO2 96%   BMI 25.47 kg/m   General:   Alert,  Well-developed, well-nourished, pleasant and cooperative in NAD Lungs:  Clear throughout to auscultation.   Heart:  Regular rate and rhythm; no murmurs, clicks, rubs,  or gallops. Abdomen:  Soft, nontender and nondistended. Normal bowel sounds.   Neuro/Psych:  Alert and cooperative. Normal mood and affect. A and O x 3   '@Llewellyn Choplin'$  Simonne Maffucci, MD, [facility] [phone] (pager) 12/09/2022 11:00 AM@

## 2022-12-09 NOTE — Progress Notes (Signed)
Report given to PACU, vss 

## 2022-12-09 NOTE — Patient Instructions (Signed)
Thank you for coming in to see [place] today! Resume your diet and mediations/supplements today. Return to regular daily activities tomorrow. Recommend colonoscopy in future, will decide when based on today's pathology results.   YOU HAD AN ENDOSCOPIC PROCEDURE TODAY AT [facility]:   Refer to the procedure report that was given to you for any specific questions about what was found during the examination.  If the procedure report does not answer your questions, please call your gastroenterologist to clarify.  If you requested that your care partner not be given the details of your procedure findings, then the procedure report has been included in a sealed envelope for you to review at your convenience later.  YOU SHOULD EXPECT: Some feelings of bloating in the abdomen. Passage of more gas than usual.  Walking can help get rid of the air that was put into your GI tract during the procedure and reduce the bloating. If you had a lower endoscopy (such as a colonoscopy or flexible sigmoidoscopy) you may notice spotting of blood in your stool or on the toilet paper. If you underwent a bowel prep for your procedure, you may not have a normal bowel movement for a few days.  Please Note:  You might notice some irritation and congestion in your nose or some drainage.  This is from the oxygen used during your procedure.  There is no need for concern and it should clear up in a day or so.  SYMPTOMS TO REPORT IMMEDIATELY:  Following lower endoscopy (colonoscopy or flexible sigmoidoscopy):  Excessive amounts of blood in the stool  Significant tenderness or worsening of abdominal pains  Swelling of the abdomen that is new, acute  Fever of 100F or higher    For urgent or emergent issues, a gastroenterologist can be reached at any hour by calling [phone]. Do not use MyChart messaging for urgent concerns.    DIET:  We do recommend a small meal at first, but then you may proceed to your  regular diet.  Drink plenty of fluids but you should avoid alcoholic beverages for 24 hours.  ACTIVITY:  You should plan to take it easy for the rest of today and you should NOT DRIVE or use heavy machinery until tomorrow (because of the sedation medicines used during the test).    FOLLOW UP: Our staff will call the number listed on your records the next business day following your procedure.  We will call around 7:15- 8:00 am to check on you and address any questions or concerns that you may have regarding the information given to you following your procedure. If we do not reach you, we will leave a message.     If any biopsies were taken you will be contacted by phone or by letter within the next 1-3 weeks.  Please call us at [phone] if you have not heard about the biopsies in 3 weeks.    SIGNATURES/CONFIDENTIALITY: You and/or your care partner have signed paperwork which will be entered into your electronic medical record.  These signatures attest to the fact that that the information above on your After Visit Summary has been reviewed and is understood.  Full responsibility of the confidentiality of this discharge information lies with you and/or your care-partner.

## 2022-12-09 NOTE — Progress Notes (Signed)
Called to room to assist during endoscopic procedure.  Patient ID and intended procedure confirmed with present staff. Received instructions for my participation in the procedure from the performing physician.  

## 2022-12-09 NOTE — Progress Notes (Signed)
Pt's states no medical or surgical changes since previsit or office visit. 

## 2022-12-09 NOTE — Op Note (Signed)
[facility] Patient Name: Holly Beasley Procedure Date: 12/09/2022 10:45 AM MRN: [id number] Endoscopist: Gatha Mayer , MD, [phone] Age: 72 Referring MD:  Date of Birth: 1950/11/23 Gender: Female Account #: [id number] Procedure:                Colonoscopy Indications:              Surveillance: Personal history of adenomatous                            polyps on last colonoscopy > 3 years ago, Last                            colonoscopy: August 2020 Medicines:                Monitored Anesthesia Care Procedure:                Pre-Anesthesia Assessment:                           - Prior to the procedure, a History and Physical                            was performed, and patient medications and                            allergies were reviewed. The patient's tolerance of                            previous anesthesia was also reviewed. The risks                            and benefits of the procedure and the sedation                            options and risks were discussed with the patient.                            All questions were answered, and informed consent                            was obtained. Prior Anticoagulants: The patient has                            taken no anticoagulant or antiplatelet agents. ASA                            Grade Assessment: II - A patient with mild systemic                            disease. After reviewing the risks and benefits,                            the patient was deemed in satisfactory condition to  undergo the procedure.                           After obtaining informed consent, the colonoscope                            was passed under direct vision. Throughout the                            procedure, the patient's blood pressure, pulse, and                            oxygen saturations were monitored continuously. The                            CF [id number] was introduced  through the anus                            and advanced to the the cecum, identified by                            appendiceal orifice and ileocecal valve. The                            colonoscopy was somewhat difficult due to                            significant looping. Successful completion of the                            procedure was aided by abdominal binder. The                            patient tolerated the procedure well. The quality                            of the bowel preparation was good. The ileocecal                            valve, appendiceal orifice, and rectum were                            photographed. Scope In: 11:22:35 AM Scope Out: 11:40:10 AM Scope Withdrawal Time: 0 hours 11 minutes 53 seconds  Total Procedure Duration: 0 hours 17 minutes 35 seconds  Findings:                 The perianal and digital rectal examinations were                            normal.                           Two sessile polyps were found in the sigmoid colon  and descending colon. The polyps were diminutive in                            size. These polyps were removed with a cold snare.                            Resection and retrieval were complete. Verification                            of patient identification for the specimen was                            done. Estimated blood loss was minimal.                           Diverticula were found in the sigmoid colon.                           The exam was otherwise without abnormality on                            direct and retroflexion views. Complications:            No immediate complications. Estimated Blood Loss:     Estimated blood loss was minimal. Impression:               - Two diminutive polyps in the sigmoid colon and in                            the descending colon, removed with a cold snare.                            Resected and retrieved.                           -  Diverticulosis in the sigmoid colon.                           - The examination was otherwise normal on direct                            and retroflexion views.                           - Personal history of colonic polyps. 2010 single                            adenoma                           05/2019 16 polyps max 8 mm, mix of adenomas,                            inflammatory and hyperplastic Recommendation:           - Patient has a contact number available  for                            emergencies. The signs and symptoms of potential                            delayed complications were discussed with the                            patient. Return to normal activities tomorrow.                            Written discharge instructions were provided to the                            patient.                           - Resume previous diet.                           - Continue present medications.                           - Repeat colonoscopy is recommended. The                            colonoscopy date will be determined after pathology                            results from today's exam become available for                            review. Would apply abdominal binder again to                            overcome looping difficulties. Gatha Mayer, MD 12/09/2022 11:45:52 AM This report has been signed electronically.

## 2022-12-10 ENCOUNTER — Telehealth: Payer: Self-pay

## 2022-12-10 NOTE — Telephone Encounter (Signed)
Left message on follow up call. 

## 2022-12-12 ENCOUNTER — Other Ambulatory Visit: Payer: Self-pay | Admitting: Orthopaedic Surgery

## 2022-12-17 ENCOUNTER — Encounter: Payer: Self-pay | Admitting: Internal Medicine

## 2022-12-30 ENCOUNTER — Other Ambulatory Visit: Payer: Self-pay | Admitting: Family

## 2022-12-30 ENCOUNTER — Telehealth: Payer: Self-pay | Admitting: Internal Medicine

## 2022-12-30 MED ORDER — DAPAGLIFLOZIN PROPANEDIOL 5 MG PO TABS: 5.0000 mg | ORAL_TABLET | Freq: Every day | ORAL | 0 refills | Status: DC

## 2022-12-30 NOTE — Telephone Encounter (Signed)
Requesting the generic of dapagliflozin propanediol (FARXIGA) 5 MG TABS tablet 90d supply   [id number] - [place], [place] [address] Phone: [phone]  Fax: [phone]

## 2023-01-21 NOTE — Patient Instructions (Signed)
SURGICAL WAITING ROOM VISITATION Patients having surgery or a procedure may have no more than 2 support people in the waiting area - these visitors may rotate in the visitor waiting room.   Due to an increase in RSV and influenza rates and associated hospitalizations, children ages 29 and under may not visit patients in [facility] hospitals. If the patient needs to stay at the hospital during part of their recovery, the visitor guidelines for inpatient rooms apply.  PRE-OP VISITATION  Pre-op nurse will coordinate an appropriate time for 1 support person to accompany the patient in pre-op.  This support person may not rotate.  This visitor will be contacted when the time is appropriate for the visitor to come back in the pre-op area.  Please refer to the [facility] website for the visitor guidelines for Inpatients (after your surgery is over and you are in a regular room).  You are not required to quarantine at this time prior to your surgery. However, you must do this: Hand Hygiene often Do NOT share personal items Notify your provider if you are in close contact with someone who has COVID or you develop fever 100.4 or greater, new onset of sneezing, cough, sore throat, shortness of breath or body aches.  If you test positive for Covid or have been in contact with anyone that has tested positive in the last 10 days please notify you surgeon.    Your procedure is scheduled on:  Tuesday  February 03, 2023  Report to [facility] Main Entrance: [place] entrance where the [employer] is available.   Report to admitting at: 05:15    AM  +++++Call this number if you have any questions or problems the morning of surgery [phone]  Do not eat food after Midnight the night prior to your surgery/procedure.  After Midnight you may have the following liquids until    04:30 AM  DAY OF SURGERY  Clear Liquid Diet Water Black Coffee (sugar ok, NO MILK/CREAM OR CREAMERS)  Tea (sugar ok,  NO MILK/CREAM OR CREAMERS) regular and decaf                             Plain Jell-O  with no fruit (NO RED)                                           Fruit ices (not with fruit pulp, NO RED)                                     Popsicles (NO RED)                                                                  Juice: apple, WHITE grape, WHITE cranberry Sports drinks like Gatorade or Powerade (NO RED)                    The day of surgery:  Drink ONE (1) Pre-Surgery G2 at  04:30 AM the morning of surgery. Drink in  one sitting. Do not sip.  This drink was given to you during your hospital pre-op appointment visit. Nothing else to drink after completing the Pre-Surgery  G2 : No candy, chewing gum or throat lozenges.    FOLLOW  ANY ADDITIONAL PRE OP INSTRUCTIONS YOU RECEIVED FROM YOUR SURGEON'S OFFICE!!!   Oral Hygiene is also important to reduce your risk of infection.        Remember - BRUSH YOUR TEETH THE MORNING OF SURGERY WITH YOUR REGULAR TOOTHPASTE  Do NOT smoke after Midnight the night before surgery.  Take ONLY these medicines the morning of surgery with A SIP OF WATER: carvedilol  ????                   You may not have any metal on your body including hair pins, jewelry, and body piercing  Do not wear make-up, lotions, powders, perfumes, or deodorant  Do not wear nail polish including gel and S&S, artificial / acrylic nails, or any other type of covering on natural nails including finger and toenails. If you have artificial nails, gel coating, etc., that needs to be removed by a nail salon, Please have this removed prior to surgery. Not doing so may mean that your surgery could be cancelled or delayed if the Surgeon or anesthesia staff feels like they are unable to monitor you safely.   Do not shave 48 hours prior to surgery to avoid nicks in your skin which may contribute to postoperative infections.   Contacts, Hearing Aids, dentures or bridgework may not be worn into  surgery. DENTURES WILL BE REMOVED PRIOR TO SURGERY PLEASE DO NOT APPLY "Poly grip" OR ADHESIVES!!!  Patients discharged on the day of surgery will not be allowed to drive home.  Someone NEEDS to stay with you for the first 24 hours after anesthesia.  Do not bring your home medications to the hospital. The Pharmacy will dispense medications listed on your medication list to you during your admission in the Hospital.  Special Instructions: Bring a copy of your healthcare power of attorney and living will documents the day of surgery, if you wish to have them scanned into your [employer] Records- EPIC  Please read over the following fact sheets you were given: IF YOU HAVE QUESTIONS ABOUT YOUR PRE-OP INSTRUCTIONS, PLEASE CALL [phone].    +++++++ PLEASE FOLLOW THE ATTACHED INFORMATION REGARDING SHOWERING / BATHING SCHEDULE  PRIOR TO YOUR SURGERY. Start this schedule on :  Friday  January 30, 2023     FAILURE TO FOLLOW THESE INSTRUCTIONS MAY RESULT IN THE CANCELLATION OF YOUR SURGERY  PATIENT SIGNATURE_________________________________  NURSE SIGNATURE__________________________________  ________________________________________________________________________          Holly MireIncentive Spirometer    An incentive spirometer is a tool that can help keep your lungs clear and active. This tool measures how well you are filling your lungs with each breath. Taking long deep breaths may help reverse or decrease the chance of developing breathing (pulmonary) problems (especially infection) following: A long period of time when you are unable to move or be active. BEFORE THE PROCEDURE  If the spirometer includes an indicator to show your best effort, your nurse or respiratory therapist will set it to a desired goal. If possible, sit up straight or lean slightly forward. Try not to slouch. Hold the incentive spirometer in an upright position. INSTRUCTIONS FOR USE  Sit on the edge of your  bed if possible, or sit up as far as you can in  bed or on a chair. Hold the incentive spirometer in an upright position. Breathe out normally. Place the mouthpiece in your mouth and seal your lips tightly around it. Breathe in slowly and as deeply as possible, raising the piston or the ball toward the top of the column. Hold your breath for 3-5 seconds or for as long as possible. Allow the piston or ball to fall to the bottom of the column. Remove the mouthpiece from your mouth and breathe out normally. Rest for a few seconds and repeat Steps 1 through 7 at least 10 times every 1-2 hours when you are awake. Take your time and take a few normal breaths between deep breaths. The spirometer may include an indicator to show your best effort. Use the indicator as a goal to work toward during each repetition. After each set of 10 deep breaths, practice coughing to be sure your lungs are clear. If you have an incision (the cut made at the time of surgery), support your incision when coughing by placing a pillow or rolled up towels firmly against it. Once you are able to get out of bed, walk around indoors and cough well. You may stop using the incentive spirometer when instructed by your caregiver.  RISKS AND COMPLICATIONS Take your time so you do not get dizzy or light-headed. If you are in pain, you may need to take or ask for pain medication before doing incentive spirometry. It is harder to take a deep breath if you are having pain. AFTER USE Rest and breathe slowly and easily. It can be helpful to keep track of a log of your progress. Your caregiver can provide you with a simple table to help with this. If you are using the spirometer at home, follow these instructions: SEEK MEDICAL CARE IF:  You are having difficultly using the spirometer. You have trouble using the spirometer as often as instructed. Your pain medication is not giving enough relief while using the spirometer. You develop fever  of 100.5 F (38.1 C) or higher.                                                                                                    SEEK IMMEDIATE MEDICAL CARE IF:  You cough up bloody sputum that had not been present before. You develop fever of 102 F (38.9 C) or greater. You develop worsening pain at or near the incision site. MAKE SURE YOU:  Understand these instructions. Will watch your condition. Will get help right away if you are not doing well or get worse. Document Released: 02/09/2007 Document Revised: 12/22/2011 Document Reviewed: 04/12/2007 [facility] Patient Information 2014 [place], [place].

## 2023-01-21 NOTE — Progress Notes (Addendum)
COVID Vaccine received:  []  No [x]  Yes Date of any COVID positive Test in last 90 days:  PCP - Berniece Andreas, MD     Evelena Peat, MD cardiac clearance 10-14-22 note Cardiologist - None Hillis Range, MD)  Robin Searing, NP  cardiac clearance 10-14-22 telephone note.   Chest x-ray -  EKG -  12-06-2021  Epic   will repeat at PST Stress Test -  ECHO - 03-14-2021  Epic Cardiac Cath -   PCR screen: [x]  Ordered & Completed           []   No Order but Needs PROFEND           []   N/A for this surgery  Surgery Plan:  [x]  Ambulatory                            []  Outpatient in bed                            []  Admit  Anesthesia:    []  General  [x]  Spinal                           []   Choice []   MAC   Pacemaker / ICD device [x]  No []  Yes   Spinal Cord Stimulator:[x]  No []  Yes       History of Sleep Apnea? []  No [x]  Yes  mild CPAP used?- [x]  No []  Yes    Does the patient monitor blood sugar?          []  No []  Yes  []  N/A  Patient has: []  NO Hx DM   []  Pre-DM                 []  DM1  [x]   DM2 Does patient have a [employer] or Dexacom? []  No []  Yes   Fasting Blood Sugar Ranges-  Checks Blood Sugar _____ times a day  SGLT-2 inhibitors / usual dose - Farxiga SGLT-2 instructions: hold x 72 hrs  Other Diabetic medications/ instructions: Metformin; 500 mg. (4 tablets q am w/ meal)   Will hold DOS  Blood Thinner / Instructions:None Aspirin Instructions: None  ERAS Protocol Ordered: []  No  [x]  Yes PRE-SURGERY []  ENSURE  [x]  G2  Patient is to be NPO after: 04:30 am  Comments: Patient was given the 5 CHG shower / bath instructions for THA surgery along with 2 bottles of the CHG soap. Patient will start this on: Friday 01-30-23   Patient voiced understanding of this process.   Activity level: Patient is able / unable to climb a flight of stairs without difficulty; []  No CP  []  No SOB, but would have ___   Patient can / can not perform ADLs without assistance.   Anesthesia review: DM2, HTN,  Hx SVT, ACDF (C4-5, C5-6 on 01-13-22), Hx ETOH, Mild OSA- no CPAP, scoliosis (spinal anes)  Patient denies shortness of breath, fever, cough and chest pain at PAT appointment.  Patient verbalized understanding and agreement to the Pre-Surgical Instructions that were given to them at this PAT appointment. Patient was also educated of the need to review these PAT instructions again prior to her surgery.I reviewed the appropriate phone numbers to call if they have any and questions or concerns.

## 2023-01-22 ENCOUNTER — Ambulatory Visit (HOSPITAL_COMMUNITY)
Admission: RE | Admit: 2023-01-22 | Discharge: 2023-01-22 | Disposition: A | Discharge status: Home / Self Care | Payer: 59 | Source: Ambulatory Visit | Attending: Orthopaedic Surgery | Admitting: Orthopaedic Surgery

## 2023-01-22 ENCOUNTER — Other Ambulatory Visit: Payer: Self-pay

## 2023-01-22 ENCOUNTER — Encounter (HOSPITAL_COMMUNITY): Payer: Self-pay

## 2023-01-22 ENCOUNTER — Encounter (HOSPITAL_COMMUNITY)
Admission: RE | Admit: 2023-01-22 | Discharge: 2023-01-22 | Disposition: A | Discharge status: Home / Self Care | Payer: 59 | Source: Ambulatory Visit | Attending: Orthopaedic Surgery | Admitting: Orthopaedic Surgery

## 2023-01-22 VITALS — BP 120/63 | HR 82 | Temp 98.1°F | Resp 18 | Ht 68.5 in | Wt 170.0 lb

## 2023-01-22 DIAGNOSIS — M1712 Unilateral primary osteoarthritis, left knee: Secondary | ICD-10-CM | POA: Insufficient documentation

## 2023-01-22 DIAGNOSIS — F1011 Alcohol abuse, in remission: Secondary | ICD-10-CM

## 2023-01-22 DIAGNOSIS — E119 Type 2 diabetes mellitus without complications: Secondary | ICD-10-CM | POA: Insufficient documentation

## 2023-01-22 DIAGNOSIS — Z01818 Encounter for other preprocedural examination: Secondary | ICD-10-CM | POA: Diagnosis not present

## 2023-01-22 DIAGNOSIS — G4730 Sleep apnea, unspecified: Secondary | ICD-10-CM | POA: Diagnosis not present

## 2023-01-22 DIAGNOSIS — I10 Essential (primary) hypertension: Secondary | ICD-10-CM | POA: Diagnosis not present

## 2023-01-22 HISTORY — DX: Cardiac arrhythmia, unspecified: I49.9

## 2023-01-22 LAB — COMPREHENSIVE METABOLIC PANEL
ALT: 19 U/L (ref 0–44)
AST: 18 U/L (ref 15–41)
Albumin: 3.7 g/dL (ref 3.5–5.0)
Alkaline Phosphatase: 52 U/L (ref 38–126)
Anion gap: 8 (ref 5–15)
BUN: 19 mg/dL (ref 8–23)
CO2: 23 mmol/L (ref 22–32)
Calcium: 9.4 mg/dL (ref 8.9–10.3)
Chloride: 105 mmol/L (ref 98–111)
Creatinine, Ser: 0.82 mg/dL (ref 0.44–1.00)
GFR, Estimated: >60 mL/min (ref >60)
Glucose, Bld: 186 mg/dL — ABNORMAL HIGH (ref 70–99)
Potassium: 4.1 mmol/L (ref 3.5–5.1)
Sodium: 136 mmol/L (ref 135–145)
Total Bilirubin: 0.4 mg/dL (ref 0.3–1.2)
Total Protein: 6.9 g/dL (ref 6.5–8.1)

## 2023-01-22 LAB — GLUCOSE, CAPILLARY: Glucose-Capillary: 186 mg/dL — ABNORMAL HIGH (ref 70–99)

## 2023-01-22 LAB — CBC
HCT: 35.9 % — ABNORMAL LOW (ref 36.0–46.0)
Hemoglobin: 11.3 g/dL — ABNORMAL LOW (ref 12.0–15.0)
MCH: 27.2 pg (ref 26.0–34.0)
MCHC: 31.5 g/dL (ref 30.0–36.0)
MCV: 86.3 fL (ref 80.0–100.0)
Platelets: 322 10*3/uL (ref 150–400)
RBC: 4.16 MIL/uL (ref 3.87–5.11)
RDW: 14.2 % (ref 11.5–15.5)
WBC: 6.3 10*3/uL (ref 4.0–10.5)
nRBC: 0 % (ref 0.0–0.2)

## 2023-01-22 LAB — HEMOGLOBIN A1C
Hgb A1c MFr Bld: 7.2 % — ABNORMAL HIGH (ref 4.8–5.6)
Mean Plasma Glucose: 159.94 mg/dL

## 2023-01-22 LAB — SURGICAL PCR SCREEN
MRSA, PCR: NEGATIVE
Staphylococcus aureus: POSITIVE — AB

## 2023-01-22 NOTE — Progress Notes (Signed)
Patient's PCR screen is positive for STAPH. Appropriate notes have been placed on the patient's chart. This note has been routed to Dr. Jerl Santos for review. The Patient's surgery is currently scheduled for:  02-03-2023  at [facility]  Rudean Haskell, BSN, CVRN-BC   Pre-Surgical Testing Nurse [facility]  [phone]

## 2023-01-23 NOTE — Progress Notes (Signed)
Individual chart review   Case: [id number] Date/Time: 02/03/23 0715   Procedure: LEFT TOTAL KNEE ARTHROPLASTY (Left: Knee)   Anesthesia type: Spinal   Pre-op diagnosis: LEFT KNEE DEGENERATIVE JOINT DISEASE   Location: [facility] 06 / [facility]   Surgeons: Marcene Corning, MD       DISCUSSION: 72 year old never smoker with history of diabetes, sleep apnea, HTN, left knee DJD scheduled for above procedure 02/03/2023 with Dr. Marcene Corning.  Patient seen by PCP 10/14/2022 for preoperative evaluation.  Per office visit note, "Generally doing well. No history of known CAD. She does have type 2 diabetes which is well-controlled as below. She has hypertension which is stable. No recent chest pains. Cleared by cardiology earlier today."  Patient seen by cardiology 10/14/2022 with preoperative evaluation.  Per office visit note, "he patient affirms she has been doing well without any new cardiac symptoms. They are able to achieve 4 METS without cardiac limitations. Therefore, based on ACC/AHA guidelines, the patient would be at acceptable risk for the planned procedure without further cardiovascular testing. The patient was advised that if she develops new symptoms prior to surgery to contact our office to arrange for a follow-up visit, and she verbalized understanding.    Ms. Petrenko perioperative risk of a major cardiac event is  % according to the Revised Cardiac Risk Index (RCRI).  Therefore, she is at low risk for perioperative complications.   Her functional capacity is good at 4.31 METs according to the Duke Activity Status Index (DASI). Recommendations: According to ACC/AHA guidelines, no further cardiovascular testing needed.  The patient may proceed to surgery at acceptable risk."  Anticipate pt can proceed with planned procedure barring acute status change.   VS: BP 120/63 Comment: right arm sitting  Pulse 82   Temp 36.7 C (Oral)   Resp 18   Ht 5' 8.5" (1.74 m)   Wt 77.1 kg   SpO2 95%   BMI  25.47 kg/m   PROVIDERS: Panosh, Neta Mends, MD is PCP   LABS: Labs reviewed: Acceptable for surgery. (all labs ordered are listed, but only abnormal results are displayed)  Labs Reviewed  SURGICAL PCR SCREEN - Abnormal; Notable for the following components:      Result Value   Staphylococcus aureus POSITIVE (*)    All other components within normal limits  HEMOGLOBIN A1C - Abnormal; Notable for the following components:   Hgb A1c MFr Bld 7.2 (*)    All other components within normal limits  COMPREHENSIVE METABOLIC PANEL - Abnormal; Notable for the following components:   Glucose, Bld 186 (*)    All other components within normal limits  CBC - Abnormal; Notable for the following components:   Hemoglobin 11.3 (*)    HCT 35.9 (*)    All other components within normal limits  GLUCOSE, CAPILLARY - Abnormal; Notable for the following components:   Glucose-Capillary 186 (*)    All other components within normal limits     IMAGES:   EKG:   CV: Myocardial perfusion 02/21/2021 The left ventricular ejection fraction is normal (55-65%). Nuclear stress EF: 62%. There was no ST segment deviation noted during stress. The study is normal. This is a low risk study.  Echo 03/14/2021 1. Left ventricular ejection fraction, by estimation, is 60 to 65%. The  left ventricle has normal function. The left ventricle has no regional  wall motion abnormalities. There is mild left ventricular hypertrophy.  Left ventricular diastolic parameters  were normal.   2. Right  ventricular systolic function is normal. The right ventricular  size is normal. There is normal pulmonary artery systolic pressure.   3. The mitral valve is normal in structure. No evidence of mitral valve  regurgitation. No evidence of mitral stenosis.   4. The aortic valve is tricuspid. There is mild calcification of the  aortic valve. There is mild thickening of the aortic valve. Aortic valve  regurgitation is not visualized.  Mild aortic valve sclerosis is present,  with no evidence of aortic valve  stenosis.   5. The inferior vena cava is normal in size with greater than 50%  respiratory variability, suggesting right atrial pressure of 3 mmHg.   Past Medical History:  Diagnosis Date   Allergy    seasonal,take shots   Cataract    bilateral,removed 2019   Depression    Diabetes mellitus without complication    Dysrhythmia    SVTs   GERD (gastroesophageal reflux disease)    History of ETOH abuse    recovering  in remission   Hyperlipidemia    Hypertension    Osteoarthritis    Osteopenia    dexa -1.7 h -1.35    Scoliosis    mild scoliosis, leg length discrepancy   Skin cancer    basal cell nose   Sleep apnea    did not meet criteria for cpap-has mouthpiece not using per pt   Substance abuse    etoh 40 years ago   SVT (supraventricular tachycardia) 08/15/2016   Varicose veins    Wears contact lenses     Past Surgical History:  Procedure Laterality Date   ANTERIOR FUSION CERVICAL SPINE     [phone]   BREAST BIOPSY     2003-rt   CATARACT EXTRACTION, BILATERAL  2019   COLONOSCOPY W/ POLYPECTOMY  [id number],[id number], 11-2022   precancerous   2004   DENTAL SURGERY  2015   bone graft    KNEE ARTHROSCOPY WITH LATERAL MENISECTOMY Left 11/10/2014   Procedure: LEFT KNEE ARTHROSCOPY PARTIAL LATERAL MENISECTOMY CHONDROPLASTY;  Surgeon: Velna Ochs, MD;  Location: [facility];  Service: Orthopedics;  Laterality: Left;   NASAL SINUS SURGERY  11/13/2008   polyps removed   skin cancer removal     basal cell nose 2004   TONSILLECTOMY AND ADENOIDECTOMY  1960   VENOUS ABLATION Left 2015   Dr Hart Rochester    MEDICATIONS:  atorvastatin (LIPITOR) 40 MG tablet   azelastine (ASTELIN) 0.1 % nasal spray   carvedilol (COREG) 12.5 MG tablet   cetirizine (ZYRTEC) 10 MG tablet   Cholecalciferol (VITAMIN D3) 25 MCG (1000 UT) capsule   Continuous Blood Gluc Receiver (FREESTYLE LIBRE 14 DAY READER) DEVI    Continuous Blood Gluc Sensor (FREESTYLE LIBRE 14 DAY SENSOR) MISC   Cyanocobalamin (B-12) 1000 MCG TABS   dapagliflozin propanediol (FARXIGA) 5 MG TABS tablet   DULoxetine (CYMBALTA) 60 MG capsule   EPINEPHrine 0.3 mg/0.3 mL IJ SOAJ injection   losartan (COZAAR) 100 MG tablet   Magnesium 400 MG TABS   metFORMIN (GLUCOPHAGE) 500 MG tablet   montelukast (SINGULAIR) 10 MG tablet   Multiple Vitamins-Minerals (MULTIVITAMIN GUMMIES ADULTS PO)   omeprazole (PRILOSEC) 40 MG capsule   No current facility-administered medications for this encounter.    Jodell Cipro Ward, PA-C [facility] Pre-Surgical Testing [phone]

## 2023-01-23 NOTE — Anesthesia Preprocedure Evaluation (Addendum)
Anesthesia Evaluation  Patient identified by MRN, date of birth, ID band Patient awake    Reviewed: Allergy & Precautions, H&P , NPO status , Patient's Chart, lab work & pertinent test results  Airway Mallampati: II  TM Distance: >3 FB Neck ROM: Full    Dental no notable dental hx.    Pulmonary sleep apnea    Pulmonary exam normal breath sounds clear to auscultation       Cardiovascular hypertension, Normal cardiovascular exam Rhythm:Regular Rate:Normal     Neuro/Psych negative neurological ROS  negative psych ROS   GI/Hepatic negative GI ROS, Neg liver ROS,,,  Endo/Other  diabetes    Renal/GU negative Renal ROS  negative genitourinary   Musculoskeletal  (+) Arthritis , Osteoarthritis,    Abdominal   Peds negative pediatric ROS (+)  Hematology negative hematology ROS (+)   Anesthesia Other Findings   Reproductive/Obstetrics negative OB ROS                             Anesthesia Physical Anesthesia Plan  ASA: 2  Anesthesia Plan: Spinal   Post-op Pain Management: Regional block*   Induction: Intravenous  PONV Risk Score and Plan: 2 and Ondansetron, Dexamethasone, Treatment may vary due to age or medical condition and Propofol infusion  Airway Management Planned: Simple Face Mask  Additional Equipment:   Intra-op Plan:   Post-operative Plan:   Informed Consent: I have reviewed the patients History and Physical, chart, labs and discussed the procedure including the risks, benefits and alternatives for the proposed anesthesia with the patient or authorized representative who has indicated his/her understanding and acceptance.     Dental advisory given  Plan Discussed with: CRNA and Surgeon  Anesthesia Plan Comments: (See PAT note 01/22/2023)       Anesthesia Quick Evaluation

## 2023-02-02 NOTE — H&P (Signed)
TOTAL KNEE ADMISSION H&P  Patient is being admitted for left total knee arthroplasty.  Subjective:  Chief Complaint:left knee pain.  HPI: Holly Beasley, 72 y.o. female, has a history of pain and functional disability in the left knee due to arthritis and has failed non-surgical conservative treatments for greater than 12 weeks to includeNSAID's and/or analgesics, corticosteriod injections, viscosupplementation injections, flexibility and strengthening excercises, use of assistive devices, weight reduction as appropriate, and activity modification.  Onset of symptoms was gradual, starting 5 years ago with gradually worsening course since that time. The patient noted no past surgery on the left knee(s).  Patient currently rates pain in the left knee(s) at 10 out of 10 with activity. Patient has night pain, worsening of pain with activity and weight bearing, pain that interferes with activities of daily living, crepitus, and joint swelling.  Patient has evidence of subchondral cysts, subchondral sclerosis, periarticular osteophytes, and joint space narrowing by imaging studies.  There is no active infection.  Patient Active Problem List   Diagnosis Date Noted   HLD (hyperlipidemia) 04/14/2016   Essential hypertension 12/18/2014   Medication management 12/18/2014   Varicose veins of leg with complications 10/09/2014   Left knee pain 09/21/2014   Meniscus tear 09/21/2014   Hx of bacterial sinusitis 08/28/2014   Knee pain, acute 06/26/2014   Leg edema, left 06/20/2014   Varicose veins of lower extremities with other complications 06/20/2014   Varicose veins 05/03/2014   Inguinal adenopathy by ultrasound doppler 05/03/2014   Left leg swelling 04/25/2014   Diabetes mellitus type 2, controlled, without complications 04/25/2014   Visit for preventive health examination 06/07/2012   Foot dermatitis 06/07/2012   Shortness of breath 11/06/2011   Nausea 11/06/2011   Hand eczema 06/27/2011    Prediabetes 03/20/2011   Preventative health care 03/20/2011   Dry eyes    Headache(784.0) 12/30/2010   Scoliosis    Osteopenia    History of ETOH abuse    Sinusitis, chronic 02/27/2010   LIBIDO, DECREASED 02/27/2010   GERD 02/20/2009   CONSTIPATION, CHRONIC 02/20/2009   SKIN CANCER, HX OF 02/20/2009   OSA (obstructive sleep apnea) 03/21/2008   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 01/06/2008   HYPERTENSION 12/29/2007   EXTERNAL HEMORRHOIDS WITH OTHER COMPLICATION 12/29/2007   KNEE PAIN 12/29/2007   UNEQUAL LEG LENGTH 12/29/2007   SCOLIOSIS 12/29/2007   Hx of adenomatous colonic polyps 10/27/2007   HYPERLIPIDEMIA 06/15/2007   ALCOHOL ABUSE, IN REMISSION 06/15/2007   DEPRESSION 06/15/2007   Allergic rhinitis 06/15/2007   Osteoarthritis of left knee 06/15/2007   OSTEOPENIA 06/15/2007   HYPERGLYCEMIA, FASTING 06/15/2007   Past Medical History:  Diagnosis Date   Allergy    seasonal,take shots   Cataract    bilateral,removed 2019   Depression    Diabetes mellitus without complication    Dysrhythmia    SVTs   GERD (gastroesophageal reflux disease)    History of ETOH abuse    recovering  in remission   Hyperlipidemia    Hypertension    Osteoarthritis    Osteopenia    dexa -1.7 h -1.35    Scoliosis    mild scoliosis, leg length discrepancy   Skin cancer    basal cell nose   Sleep apnea    did not meet criteria for cpap-has mouthpiece not using per pt   Substance abuse    etoh 40 years ago   SVT (supraventricular tachycardia) 08/15/2016   Varicose veins    Wears  contact lenses     Past Surgical History:  Procedure Laterality Date   ANTERIOR FUSION CERVICAL SPINE     [phone]   BREAST BIOPSY     2003-rt   CATARACT EXTRACTION, BILATERAL  2019   COLONOSCOPY W/ POLYPECTOMY  2004,2010, 11-2022   precancerous   2004   DENTAL SURGERY  2015   bone graft    KNEE ARTHROSCOPY WITH LATERAL MENISECTOMY Left 11/10/2014   Procedure: LEFT KNEE ARTHROSCOPY  PARTIAL LATERAL MENISECTOMY CHONDROPLASTY;  Surgeon: Velna Ochs, MD;  Location: [facility];  Service: Orthopedics;  Laterality: Left;   NASAL SINUS SURGERY  11/13/2008   polyps removed   skin cancer removal     basal cell nose 2004   TONSILLECTOMY AND ADENOIDECTOMY  1960   VENOUS ABLATION Left 2015   Dr Hart [place]    No current facility-administered medications for this encounter.   Current Outpatient Medications  Medication Sig Dispense Refill Last Dose   atorvastatin (LIPITOR) 40 MG tablet TAKE 1 TABLET BY MOUTH EVERY DAY 90 tablet 0    azelastine (ASTELIN) 0.1 % nasal spray Place 2 sprays into both nostrils in the morning.      carvedilol (COREG) 12.5 MG tablet TAKE 1 TABLET BY MOUTH 2 TIMES DAILY. 180 tablet 3    cetirizine (ZYRTEC) 10 MG tablet Take 10 mg by mouth at bedtime.      Cholecalciferol (VITAMIN D3) 25 MCG (1000 UT) capsule Take 1,000 Units by mouth daily.      Cyanocobalamin (B-12) 1000 MCG TABS Take 1,000 mcg by mouth daily.      dapagliflozin propanediol (FARXIGA) 5 MG TABS tablet Take 1 tablet (5 mg total) by mouth daily. SCHEDULE ANNUAL APPT FOR FUTURE REFILLS 30 tablet 0    DULoxetine (CYMBALTA) 60 MG capsule TAKE 2 CAPSULES BY MOUTH AT BEDTIME 180 capsule 1    EPINEPHrine 0.3 mg/0.3 mL IJ SOAJ injection Inject 0.3 mg into the muscle as needed for anaphylaxis.      losartan (COZAAR) 100 MG tablet TAKE 1 TABLET BY MOUTH EVERY DAY 90 tablet 1    Magnesium 400 MG TABS Take 400 mg by mouth daily.      metFORMIN (GLUCOPHAGE) 500 MG tablet TAKE 4 TABLETS BY MOUTH EVERY DAY WITH A MEAL (Patient taking differently: Take 1,000 mg by mouth 2 (two) times daily with a meal. TAKE 4 TABLETS BY MOUTH EVERY DAY WITH A MEAL) 360 tablet 1    montelukast (SINGULAIR) 10 MG tablet TAKE 1 TABLET BY MOUTH EVERYDAY AT BEDTIME 90 tablet 0    Multiple Vitamins-Minerals (MULTIVITAMIN GUMMIES ADULTS PO) Take by mouth daily. Take 2 daily      omeprazole (PRILOSEC) 40 MG capsule  TAKE 1 CAPSULE BY MOUTH EVERY DAY 90 capsule 0    Continuous Blood Gluc Receiver (FREESTYLE LIBRE 14 DAY READER) DEVI 1 Units by Does not apply route daily. (Patient not taking: Reported on 12/09/2022) 1 each 0    Continuous Blood Gluc Sensor (FREESTYLE LIBRE 14 DAY SENSOR) MISC Check glucose daily (Patient not taking: Reported on 12/09/2022) 1 each 0    Allergies  Allergen Reactions   Bactrim [Sulfamethoxazole-Trimethoprim]     Rash and stomach pain   Sulfamethoxazole     Rash and stomach pain   Meloxicam     rash   Amoxicillin Rash    REACTION: unknown   Cephalexin     REACTION: unknown    Social History   Tobacco Use   Smoking  status: Never   Smokeless tobacco: Never  Substance Use Topics   Alcohol use: No    Alcohol/week: 0.0 standard drinks of alcohol    Comment: recovering-1983    Family History  Problem Relation Age of Onset   Lymphoma Mother    Diabetes Mother    Hyperlipidemia Mother    Colon polyps Mother    Heart failure Father    Hyperlipidemia Father    Arthritis Brother    Alcohol abuse Brother    Alcohol abuse Brother    Colon cancer Neg Hx    Esophageal cancer Neg Hx    Rectal cancer Neg Hx    Stomach cancer Neg Hx    Crohn's disease Neg Hx    Ulcerative colitis Neg Hx      Review of Systems  Musculoskeletal:  Positive for arthralgias.       Left knee  All other systems reviewed and are negative.   Objective:  Physical Exam Constitutional:      Appearance: Normal appearance.  HENT:     Head: Normocephalic and atraumatic.     Nose: Nose normal.     Mouth/Throat:     Pharynx: Oropharynx is clear.  Eyes:     Extraocular Movements: Extraocular movements intact.  Pulmonary:     Effort: Pulmonary effort is normal.  Abdominal:     Palpations: Abdomen is soft.  Musculoskeletal:     Cervical back: Normal range of motion.     Comments: Examination of the left knee shows range of motion from about 5-115 of flexion.  She has a mild to moderate  valgus deformity.  She has 1+ crepitation.  No effusion.  Tenderness palpation diffusely across her joint line.  Hip range of motion is full without pain.  She is neurovascularly intact distally.    Skin:    General: Skin is warm and dry.  Neurological:     General: No focal deficit present.     Mental Status: She is alert and oriented to person, place, and time. Mental status is at baseline.  Psychiatric:        Mood and Affect: Mood normal.        Behavior: Behavior normal.        Thought Content: Thought content normal.        Judgment: Judgment normal.     Vital signs in last 24 hours:    Labs:   Estimated body mass index is 25.47 kg/m as calculated from the following:   Height as of 01/22/23: 5' 8.5" (1.74 m).   Weight as of 01/22/23: 77.1 kg.   Imaging Review Plain radiographs demonstrate severe degenerative joint disease of the left knee(s). The overall alignment isneutral. The bone quality appears to be good for age and reported activity level.      Assessment/Plan:  End stage primary arthritis, left knee   The patient history, physical examination, clinical judgment of the provider and imaging studies are consistent with end stage degenerative joint disease of the left knee(s) and total knee arthroplasty is deemed medically necessary. The treatment options including medical management, injection therapy arthroscopy and arthroplasty were discussed at length. The risks and benefits of total knee arthroplasty were presented and reviewed. The risks due to aseptic loosening, infection, stiffness, patella tracking problems, thromboembolic complications and other imponderables were discussed. The patient acknowledged the explanation, agreed to proceed with the plan and consent was signed. Patient is being admitted for inpatient treatment for surgery, pain control, PT, OT,  prophylactic antibiotics, VTE prophylaxis, progressive ambulation and ADL's and discharge planning. The  patient is planning to be discharged home with home health services  Patient's anticipated LOS is less than 2 midnights, meeting these requirements: - Younger than 93 - Lives within 1 hour of care - Has a competent adult at home to recover with post-op recover - NO history of  - Chronic pain requiring opiods  - Diabetes  - Coronary Artery Disease  - Heart failure  - Heart attack  - Stroke  - DVT/VTE  - Cardiac arrhythmia  - Respiratory Failure/COPD  - Renal failure  - Anemia  - Advanced Liver disease

## 2023-02-03 ENCOUNTER — Ambulatory Visit (HOSPITAL_BASED_OUTPATIENT_CLINIC_OR_DEPARTMENT_OTHER): Payer: 59 | Admitting: Anesthesiology

## 2023-02-03 ENCOUNTER — Other Ambulatory Visit: Payer: Self-pay

## 2023-02-03 ENCOUNTER — Ambulatory Visit (HOSPITAL_COMMUNITY)
Admission: RE | Admit: 2023-02-03 | Discharge: 2023-02-03 | Disposition: A | Discharge status: Home / Self Care | Payer: 59 | Source: Ambulatory Visit | Attending: Orthopaedic Surgery | Admitting: Orthopaedic Surgery

## 2023-02-03 ENCOUNTER — Encounter (HOSPITAL_COMMUNITY): Payer: Self-pay | Admitting: Orthopaedic Surgery

## 2023-02-03 ENCOUNTER — Encounter (HOSPITAL_COMMUNITY)
Admission: RE | Disposition: A | Discharge status: Home / Self Care | Payer: Self-pay | Source: Ambulatory Visit | Attending: Orthopaedic Surgery

## 2023-02-03 ENCOUNTER — Ambulatory Visit (HOSPITAL_COMMUNITY): Payer: 59 | Admitting: Physician Assistant

## 2023-02-03 DIAGNOSIS — G4733 Obstructive sleep apnea (adult) (pediatric): Secondary | ICD-10-CM | POA: Insufficient documentation

## 2023-02-03 DIAGNOSIS — G4730 Sleep apnea, unspecified: Secondary | ICD-10-CM | POA: Diagnosis not present

## 2023-02-03 DIAGNOSIS — M1712 Unilateral primary osteoarthritis, left knee: Secondary | ICD-10-CM

## 2023-02-03 DIAGNOSIS — I10 Essential (primary) hypertension: Secondary | ICD-10-CM

## 2023-02-03 DIAGNOSIS — E119 Type 2 diabetes mellitus without complications: Secondary | ICD-10-CM | POA: Insufficient documentation

## 2023-02-03 HISTORY — PX: TOTAL KNEE ARTHROPLASTY: SHX125

## 2023-02-03 LAB — GLUCOSE, CAPILLARY
Glucose-Capillary: 172 mg/dL — ABNORMAL HIGH (ref 70–99)
Glucose-Capillary: 150 mg/dL — ABNORMAL HIGH (ref 70–99)

## 2023-02-03 SURGERY — ARTHROPLASTY, KNEE, TOTAL
Anesthesia: Spinal | Site: Knee | Laterality: Left

## 2023-02-03 MED ORDER — METHOCARBAMOL 500 MG IVPB - SIMPLE MED
INTRAVENOUS | Status: AC
  Administered 2023-02-03: 500 mg via INTRAVENOUS
  Filled 2023-02-03: qty 55

## 2023-02-03 MED ORDER — KETOROLAC TROMETHAMINE 15 MG/ML IJ SOLN: 7.5000 mg | Freq: Four times a day (QID) | INTRAMUSCULAR | Status: DC

## 2023-02-03 MED ORDER — BUPIVACAINE LIPOSOME 1.3 % IJ SUSP: 20.0000 mL | Freq: Once | INTRAMUSCULAR | Status: AC

## 2023-02-03 MED ORDER — OXYCODONE HCL 5 MG PO TABS
ORAL_TABLET | ORAL | Status: AC
  Filled 2023-02-03: qty 1

## 2023-02-03 MED ORDER — TRANEXAMIC ACID-NACL 1000-0.7 MG/100ML-% IV SOLN
1000.0000 mg | INTRAVENOUS | Status: AC
  Administered 2023-02-03: 1000 mg via INTRAVENOUS
  Filled 2023-02-03: qty 100

## 2023-02-03 MED ORDER — SODIUM CHLORIDE (PF) 0.9 % IJ SOLN
INTRAMUSCULAR | Status: DC | PRN
  Administered 2023-02-03: 30 mL

## 2023-02-03 MED ORDER — OXYCODONE-ACETAMINOPHEN 5-325 MG PO TABS: 1.0000 | ORAL_TABLET | Freq: Four times a day (QID) | ORAL | 0 refills | Status: DC | PRN

## 2023-02-03 MED ORDER — LIDOCAINE HCL (CARDIAC) PF 100 MG/5ML IV SOSY
PREFILLED_SYRINGE | INTRAVENOUS | Status: DC | PRN
  Administered 2023-02-03: 60 mg via INTRAVENOUS

## 2023-02-03 MED ORDER — ONDANSETRON HCL 4 MG/2ML IJ SOLN: 4.0000 mg | Freq: Once | INTRAMUSCULAR | Status: DC | PRN

## 2023-02-03 MED ORDER — SODIUM CHLORIDE (PF) 0.9 % IJ SOLN
INTRAMUSCULAR | Status: AC
  Filled 2023-02-03: qty 30

## 2023-02-03 MED ORDER — CEFAZOLIN SODIUM-DEXTROSE 2-4 GM/100ML-% IV SOLN: 2.0000 g | Freq: Four times a day (QID) | INTRAVENOUS | Status: DC

## 2023-02-03 MED ORDER — ACETAMINOPHEN 500 MG PO TABS
ORAL_TABLET | ORAL | Status: AC
  Filled 2023-02-03: qty 2

## 2023-02-03 MED ORDER — DEXAMETHASONE SODIUM PHOSPHATE 4 MG/ML IJ SOLN
INTRAMUSCULAR | Status: DC | PRN
  Administered 2023-02-03: 5 mg via INTRAVENOUS

## 2023-02-03 MED ORDER — LIDOCAINE HCL (PF) 2 % IJ SOLN
INTRAMUSCULAR | Status: AC
  Filled 2023-02-03: qty 5

## 2023-02-03 MED ORDER — CEFAZOLIN SODIUM-DEXTROSE 2-4 GM/100ML-% IV SOLN
INTRAVENOUS | Status: AC
  Filled 2023-02-03: qty 100

## 2023-02-03 MED ORDER — LACTATED RINGERS IV BOLUS
250.0000 mL | Freq: Once | INTRAVENOUS | Status: AC
  Administered 2023-02-03: 250 mL via INTRAVENOUS

## 2023-02-03 MED ORDER — VANCOMYCIN HCL IN DEXTROSE 1-5 GM/200ML-% IV SOLN: 1000.0000 mg | INTRAVENOUS | Status: DC

## 2023-02-03 MED ORDER — TRANEXAMIC ACID-NACL 1000-0.7 MG/100ML-% IV SOLN: 1000.0000 mg | Freq: Once | INTRAVENOUS | Status: DC

## 2023-02-03 MED ORDER — PROPOFOL 1000 MG/100ML IV EMUL
INTRAVENOUS | Status: AC
  Filled 2023-02-03: qty 100

## 2023-02-03 MED ORDER — METHOCARBAMOL 500 MG PO TABS: 500.0000 mg | ORAL_TABLET | Freq: Four times a day (QID) | ORAL | Status: DC | PRN

## 2023-02-03 MED ORDER — ONDANSETRON HCL 4 MG/2ML IJ SOLN
INTRAMUSCULAR | Status: DC | PRN
  Administered 2023-02-03: 4 mg via INTRAVENOUS

## 2023-02-03 MED ORDER — DEXAMETHASONE SODIUM PHOSPHATE 10 MG/ML IJ SOLN
INTRAMUSCULAR | Status: AC
  Filled 2023-02-03: qty 1

## 2023-02-03 MED ORDER — LACTATED RINGERS IV SOLN: INTRAVENOUS | Status: DC

## 2023-02-03 MED ORDER — FENTANYL CITRATE (PF) 100 MCG/2ML IJ SOLN
INTRAMUSCULAR | Status: AC
  Filled 2023-02-03: qty 2

## 2023-02-03 MED ORDER — BUPIVACAINE HCL (PF) 0.5 % IJ SOLN
INTRAMUSCULAR | Status: AC
  Filled 2023-02-03: qty 30

## 2023-02-03 MED ORDER — ASPIRIN 81 MG PO TBEC: 81.0000 mg | DELAYED_RELEASE_TABLET | Freq: Two times a day (BID) | ORAL | 0 refills | Status: DC

## 2023-02-03 MED ORDER — 0.9 % SODIUM CHLORIDE (POUR BTL) OPTIME
TOPICAL | Status: DC | PRN
  Administered 2023-02-03: 3000 mL
  Administered 2023-02-03: 1000 mL

## 2023-02-03 MED ORDER — OXYCODONE HCL 5 MG/5ML PO SOLN: 5.0000 mg | Freq: Once | ORAL | Status: AC | PRN

## 2023-02-03 MED ORDER — HYDROMORPHONE HCL 1 MG/ML IJ SOLN: 0.2500 mg | INTRAMUSCULAR | Status: DC | PRN

## 2023-02-03 MED ORDER — BUPIVACAINE LIPOSOME 1.3 % IJ SUSP
INTRAMUSCULAR | Status: DC | PRN
  Administered 2023-02-03: 20 mL

## 2023-02-03 MED ORDER — OXYCODONE HCL 5 MG PO TABS: 5.0000 mg | ORAL_TABLET | Freq: Once | ORAL | Status: AC | PRN

## 2023-02-03 MED ORDER — KETAMINE HCL 50 MG/5ML IJ SOSY
PREFILLED_SYRINGE | INTRAMUSCULAR | Status: AC
  Filled 2023-02-03: qty 5

## 2023-02-03 MED ORDER — TRANEXAMIC ACID 1000 MG/10ML IV SOLN
2000.0000 mg | INTRAVENOUS | Status: AC
  Filled 2023-02-03: qty 20

## 2023-02-03 MED ORDER — CHLORHEXIDINE GLUCONATE 0.12 % MT SOLN
15.0000 mL | Freq: Once | OROMUCOSAL | Status: AC
  Administered 2023-02-03: 15 mL via OROMUCOSAL

## 2023-02-03 MED ORDER — ACETAMINOPHEN 500 MG PO TABS
1000.0000 mg | ORAL_TABLET | Freq: Four times a day (QID) | ORAL | Status: DC
  Administered 2023-02-03: 1000 mg via ORAL

## 2023-02-03 MED ORDER — ROPIVACAINE HCL 7.5 MG/ML IJ SOLN
INTRAMUSCULAR | Status: DC | PRN
  Administered 2023-02-03: 20 mL via PERINEURAL

## 2023-02-03 MED ORDER — PROPOFOL 10 MG/ML IV BOLUS
INTRAVENOUS | Status: AC
  Filled 2023-02-03: qty 20

## 2023-02-03 MED ORDER — TIZANIDINE HCL 4 MG PO TABS: 4.0000 mg | ORAL_TABLET | Freq: Four times a day (QID) | ORAL | 1 refills | Status: DC | PRN

## 2023-02-03 MED ORDER — FENTANYL CITRATE (PF) 100 MCG/2ML IJ SOLN
INTRAMUSCULAR | Status: DC | PRN
  Administered 2023-02-03: 25 ug via INTRAVENOUS
  Administered 2023-02-03 (×2): 50 ug via INTRAVENOUS
  Administered 2023-02-03: 25 ug via INTRAVENOUS

## 2023-02-03 MED ORDER — PHENYLEPHRINE 80 MCG/ML (10ML) SYRINGE FOR IV PUSH (FOR BLOOD PRESSURE SUPPORT)
PREFILLED_SYRINGE | INTRAVENOUS | Status: AC
  Filled 2023-02-03: qty 10

## 2023-02-03 MED ORDER — OXYCODONE HCL 5 MG PO TABS
5.0000 mg | ORAL_TABLET | ORAL | Status: DC | PRN
  Administered 2023-02-03: 5 mg via ORAL

## 2023-02-03 MED ORDER — OXYCODONE HCL 5 MG PO TABS
ORAL_TABLET | ORAL | Status: AC
  Administered 2023-02-03: 5 mg via ORAL
  Filled 2023-02-03: qty 1

## 2023-02-03 MED ORDER — MIDAZOLAM HCL 5 MG/5ML IJ SOLN
INTRAMUSCULAR | Status: DC | PRN
  Administered 2023-02-03 (×2): 1 mg via INTRAVENOUS

## 2023-02-03 MED ORDER — CEFAZOLIN SODIUM-DEXTROSE 2-4 GM/100ML-% IV SOLN
INTRAVENOUS | Status: AC
  Administered 2023-02-03: 2 g via INTRAVENOUS
  Filled 2023-02-03: qty 100

## 2023-02-03 MED ORDER — LACTATED RINGERS IV BOLUS
500.0000 mL | Freq: Once | INTRAVENOUS | Status: AC
  Administered 2023-02-03: 500 mL via INTRAVENOUS

## 2023-02-03 MED ORDER — ONDANSETRON HCL 4 MG/2ML IJ SOLN
INTRAMUSCULAR | Status: AC
  Filled 2023-02-03: qty 2

## 2023-02-03 MED ORDER — METHOCARBAMOL 500 MG IVPB - SIMPLE MED: 500.0000 mg | Freq: Four times a day (QID) | INTRAVENOUS | Status: DC | PRN

## 2023-02-03 MED ORDER — BUPIVACAINE-EPINEPHRINE 0.5% -1:200000 IJ SOLN
INTRAMUSCULAR | Status: DC | PRN
  Administered 2023-02-03: 30 mL

## 2023-02-03 MED ORDER — POVIDONE-IODINE 10 % EX SWAB: 2.0000 | Freq: Once | CUTANEOUS | Status: AC

## 2023-02-03 MED ORDER — KETAMINE HCL 10 MG/ML IJ SOLN
INTRAMUSCULAR | Status: DC | PRN
  Administered 2023-02-03 (×2): 20 mg via INTRAVENOUS
  Administered 2023-02-03: 30 mg via INTRAVENOUS

## 2023-02-03 MED ORDER — BUPIVACAINE IN DEXTROSE 0.75-8.25 % IT SOLN
INTRATHECAL | Status: DC | PRN
  Administered 2023-02-03: 1.6 mL via INTRATHECAL

## 2023-02-03 MED ORDER — PROPOFOL 500 MG/50ML IV EMUL
INTRAVENOUS | Status: DC | PRN
  Administered 2023-02-03: 50 ug/kg/min via INTRAVENOUS

## 2023-02-03 MED ORDER — PHENYLEPHRINE HCL (PRESSORS) 10 MG/ML IV SOLN
INTRAVENOUS | Status: DC | PRN
  Administered 2023-02-03: 80 ug via INTRAVENOUS

## 2023-02-03 MED ORDER — CEFAZOLIN SODIUM-DEXTROSE 2-4 GM/100ML-% IV SOLN
2.0000 g | Freq: Once | INTRAVENOUS | Status: AC
  Administered 2023-02-03: 2 g via INTRAVENOUS

## 2023-02-03 MED ORDER — BUPIVACAINE LIPOSOME 1.3 % IJ SUSP
INTRAMUSCULAR | Status: AC
  Filled 2023-02-03: qty 20

## 2023-02-03 MED ORDER — MIDAZOLAM HCL 2 MG/2ML IJ SOLN
INTRAMUSCULAR | Status: AC
  Filled 2023-02-03: qty 2

## 2023-02-03 MED ORDER — ORAL CARE MOUTH RINSE: 15.0000 mL | Freq: Once | OROMUCOSAL | Status: AC

## 2023-02-03 MED ORDER — TRANEXAMIC ACID 1000 MG/10ML IV SOLN
INTRAVENOUS | Status: DC | PRN
  Administered 2023-02-03: 2000 mg via TOPICAL

## 2023-02-03 MED ORDER — ACETAMINOPHEN 10 MG/ML IV SOLN: 1000.0000 mg | Freq: Once | INTRAVENOUS | Status: DC | PRN

## 2023-02-03 SURGICAL SUPPLY — 61 items
ATTUNE MED DOME PAT 38 KNEE (Knees) IMPLANT
ATTUNE PS FEM LT SZ 5 CEM KNEE (Femur) IMPLANT
ATTUNE PSRP INSR SZ5 5 KNEE (Insert) IMPLANT
BAG COUNTER SPONGE SURGICOUNT (BAG) ×2 IMPLANT
BAG DECANTER FOR FLEXI CONT (MISCELLANEOUS) ×2 IMPLANT
BAG SPEC THK2 15X12 ZIP CLS (MISCELLANEOUS) ×1
BAG SPNG CNTER NS LX DISP (BAG) ×1
BAG ZIPLOCK 12X15 (MISCELLANEOUS) ×2 IMPLANT
BASE TIBIA ATTUNE KNEE SYS SZ6 (Knees) IMPLANT
BLADE SAGITTAL 25.0X1.19X90 (BLADE) ×2 IMPLANT
BLADE SAW SGTL 11.0X1.19X90.0M (BLADE) ×2 IMPLANT
BLADE SURG SZ10 CARB STEEL (BLADE) ×2 IMPLANT
BNDG CMPR 5X62 HK CLSR LF (GAUZE/BANDAGES/DRESSINGS) ×1
BNDG CMPR MED 10X6 ELC LF (GAUZE/BANDAGES/DRESSINGS) ×1
BNDG ELASTIC 6INX 5YD STR LF (GAUZE/BANDAGES/DRESSINGS) ×2 IMPLANT
BNDG ELASTIC 6X10 VLCR STRL LF (GAUZE/BANDAGES/DRESSINGS) IMPLANT
BOOTIES KNEE HIGH SLOAN (MISCELLANEOUS) ×2 IMPLANT
BOWL SMART MIX CTS (DISPOSABLE) ×2 IMPLANT
BSPLAT TIB 6 CMNT ROT PLAT STR (Knees) ×1 IMPLANT
CEMENT HV SMART SET (Cement) ×4 IMPLANT
COVER SURGICAL LIGHT HANDLE (MISCELLANEOUS) ×2 IMPLANT
CUFF TOURN SGL QUICK 34 (TOURNIQUET CUFF) ×1
CUFF TRNQT CYL 34X4.125X (TOURNIQUET CUFF) ×2 IMPLANT
DRAPE TOP 10253 STERILE (DRAPES) ×2 IMPLANT
DRAPE U-SHAPE 47X51 STRL (DRAPES) ×2 IMPLANT
DRSG AQUACEL AG ADV 3.5X10 (GAUZE/BANDAGES/DRESSINGS) ×2 IMPLANT
DURAPREP 26ML APPLICATOR (WOUND CARE) ×4 IMPLANT
ELECT REM PT RETURN 15FT ADLT (MISCELLANEOUS) ×2 IMPLANT
GLOVE BIO SURGEON STRL SZ8 (GLOVE) ×4 IMPLANT
GLOVE BIOGEL PI IND STRL 7.0 (GLOVE) ×2 IMPLANT
GLOVE BIOGEL PI IND STRL 8 (GLOVE) ×4 IMPLANT
GLOVE SURG SYN 7.0 (GLOVE) ×1 IMPLANT
GLOVE SURG SYN 7.0 PF PI (GLOVE) ×2 IMPLANT
GOWN SRG XL LVL 4 BRTHBL STRL (GOWNS) ×2 IMPLANT
GOWN STRL NON-REIN XL LVL4 (GOWNS) ×1
GOWN STRL REUS W/ TWL XL LVL3 (GOWN DISPOSABLE) ×4 IMPLANT
GOWN STRL REUS W/TWL XL LVL3 (GOWN DISPOSABLE) ×2
HANDPIECE INTERPULSE COAX TIP (DISPOSABLE) ×1
HOLDER FOLEY CATH W/STRAP (MISCELLANEOUS) IMPLANT
HOOD PEEL AWAY T7 (MISCELLANEOUS) ×6 IMPLANT
KIT TURNOVER KIT A (KITS) IMPLANT
MANIFOLD NEPTUNE II (INSTRUMENTS) ×2 IMPLANT
NS IRRIG 1000ML POUR BTL (IV SOLUTION) ×2 IMPLANT
PACK TOTAL KNEE CUSTOM (KITS) ×2 IMPLANT
PAD ARMBOARD 7.5X6 YLW CONV (MISCELLANEOUS) ×2 IMPLANT
PIN STEINMAN FIXATION KNEE (PIN) IMPLANT
PROTECTOR NERVE ULNAR (MISCELLANEOUS) ×2 IMPLANT
SET HNDPC FAN SPRY TIP SCT (DISPOSABLE) ×2 IMPLANT
SPIKE FLUID TRANSFER (MISCELLANEOUS) ×4 IMPLANT
SUT ETHIBOND NAB CT1 #1 30IN (SUTURE) ×2 IMPLANT
SUT VIC AB 0 CT1 36 (SUTURE) ×2 IMPLANT
SUT VIC AB 2-0 CT1 27 (SUTURE) ×1
SUT VIC AB 2-0 CT1 TAPERPNT 27 (SUTURE) ×2 IMPLANT
SUT VICRYL AB 3-0 FS1 BRD 27IN (SUTURE) ×2 IMPLANT
SUT VLOC 180 0 24IN GS25 (SUTURE) ×2 IMPLANT
TIBIA ATTUNE KNEE SYS BASE SZ6 (Knees) ×1 IMPLANT
TIBIAL BASE ROT PLAT SZ 8 KNEE (Knees) ×1 IMPLANT
TRAY FOLEY MTR SLVR 16FR STAT (SET/KITS/TRAYS/PACK) IMPLANT
WATER STERILE IRR 1000ML POUR (IV SOLUTION) ×2 IMPLANT
WRAP KNEE MAXI GEL POST OP (GAUZE/BANDAGES/DRESSINGS) ×2 IMPLANT
YANKAUER SUCT BULB TIP NO VENT (SUCTIONS) ×2 IMPLANT

## 2023-02-03 NOTE — Anesthesia Procedure Notes (Signed)
Spinal  Patient location during procedure: OR Start time: 02/03/2023 7:45 AM End time: 02/03/2023 7:49 AM Reason for block: surgical anesthesia Staffing Performed: anesthesiologist  Anesthesiologist: Eilene Ghazi, MD Performed by: Eilene Ghazi, MD Authorized by: Eilene Ghazi, MD   Preanesthetic Checklist Completed: patient identified, IV checked, site marked, risks and benefits discussed, surgical consent, monitors and equipment checked, pre-op evaluation and timeout performed Spinal Block Patient position: sitting Prep: Betadine Patient monitoring: heart rate, continuous pulse ox and blood pressure Approach: midline Location: L4-5 Injection technique: single-shot Needle Needle type: Sprotte  Needle gauge: 24 G Needle length: 9 cm Assessment Sensory level: T6 Events: CSF return Additional Notes

## 2023-02-03 NOTE — Anesthesia Postprocedure Evaluation (Signed)
Anesthesia Post Note  Patient: Holly Beasley  Procedure(s) Performed: LEFT TOTAL KNEE ARTHROPLASTY (Left: Knee)     Patient location during evaluation: PACU Anesthesia Type: Spinal Level of consciousness: oriented and awake and alert Pain management: pain level controlled Vital Signs Assessment: post-procedure vital signs reviewed and stable Respiratory status: spontaneous breathing, respiratory function stable and patient connected to nasal cannula oxygen Cardiovascular status: blood pressure returned to baseline and stable Postop Assessment: no headache, no backache and no apparent nausea or vomiting Anesthetic complications: no  No notable events documented.  Last Vitals:  Vitals:   02/03/23 1225 02/03/23 1249  BP:    Pulse: 75 82  Resp:    Temp:    SpO2: 95% 90%    Last Pain:  Vitals:   02/03/23 1225  TempSrc:   PainSc: 4                  Quaron Delacruz S

## 2023-02-03 NOTE — Transfer of Care (Signed)
Immediate Anesthesia Transfer of Care Note  Patient: Holly Beasley  Procedure(s) Performed: Procedure(s) (LRB): LEFT TOTAL KNEE ARTHROPLASTY (Left)  Patient Location: PACU  Anesthesia Type: SAB   Level of Consciousness: awake, sedated, patient cooperative and responds to stimulation  Airway & Oxygen Therapy: Patient Spontanous Breathing and Patient connected to [place] oxygen  Post-op Assessment: Report given to PACU RN, Post -op Vital signs reviewed and stable and Patient moving all extremities  Post vital signs: Reviewed and stable  Complications: No apparent anesthesia complications

## 2023-02-03 NOTE — Op Note (Signed)
PREOP DIAGNOSIS: DJD LEFT KNEE POSTOP DIAGNOSIS:  same PROCEDURE: LEFT TKR ANESTHESIA: Spinal and MAC ATTENDING SURGEON: Velna Ochs ASSISTANT: Elodia Florence PA  INDICATIONS FOR PROCEDURE: Holly Beasley is a 72 y.o. female who has struggled for a long time with pain due to degenerative arthritis of the left knee.  The patient has failed many conservative non-operative measures and at this point has pain which limits the ability to sleep and walk.  The patient is offered total knee replacement.  Informed operative consent was obtained after discussion of possible risks of anesthesia, infection, neurovascular injury, DVT, and death.  The importance of the post-operative rehabilitation protocol to optimize result was stressed extensively with the patient.  SUMMARY OF FINDINGS AND PROCEDURE:  Holly Beasley was taken to the operative suite where under the above anesthesia a left knee replacement was performed.  There were advanced degenerative changes and the bone quality was fair.  We used the DePuy Attune system and placed size 5 femur, 6 tibia, 38 mm all polyethylene patella, and a size 5 mm spacer.  She had a 10 degree flexion contracture and a moderate valgus deformity both of which we corrected. Elodia Florence PA-C assisted throughout and was invaluable to the completion of the case in that he helped retract and maintain exposure while I placed the components.  He also helped close thereby minimizing OR time.  The patient was admitted for appropriate post-op care to include perioperative antibiotics and mechanical and pharmacologic measures for DVT prophylaxis.  DESCRIPTION OF PROCEDURE:  Holly Beasley was taken to the operative suite where the above anesthesia was applied.  The patient was positioned supine and prepped and draped in normal sterile fashion.  An appropriate time out was performed.  After the administration of kefzol pre-op antibiotic the leg was elevated and exsanguinated and a  tourniquet inflated.  A standard longitudinal incision was made on the anterior knee.  Dissection was carried down to the extensor mechanism.  All appropriate anti-infective measures were used including the pre-operative antibiotic, betadine impregnated drape, and closed hooded exhaust systems for each member of the surgical team.  A medial parapatellar incision was made in the extensor mechanism and the knee cap flipped and the knee flexed.  Some residual meniscal tissues were removed along with any remaining ACL/PCL tissue.  A guide was placed on the tibia and a flat cut was made on it's superior surface.  An intramedullary guide was placed in the femur and was utilized to make anterior and posterior cuts creating an appropriate flexion gap.  A second intramedullary guide was placed in the femur to make a distal cut properly balancing the knee with an extension gap equal to the flexion gap.  The three bones sized to the above mentioned sizes and the appropriate guides were placed and utilized.  A trial reduction was done and the knee easily came to full extension and the patella tracked well on flexion.  The trial components were removed and all bones were cleaned with pulsatile lavage and then dried thoroughly.  Cement was mixed and was pressurized onto the bones followed by placement of the aforementioned components.  Excess cement was trimmed and pressure was held on the components until the cement had hardened.  The tourniquet was deflated and a small amount of bleeding was controlled with cautery and pressure.  The knee was irrigated thoroughly.  The extensor mechanism was re-approximated with #1 ethibond in interrupted fashion.  The knee was flexed and  the repair was solid.  The subcutaneous tissues were re-approximated with #0 and #2-0 vicryl and the skin closed with a subcuticular stitch and steristrips.  A sterile dressing was applied.  Intraoperative fluids, EBL, and tourniquet time can be obtained from  anesthesia records.  DISPOSITION:  The patient was taken to recovery room in stable condition and scheduled to potentially go home same day depending on ability to walk and tolerate liquids.  Velna Ochs 02/03/2023, 9:24 AM

## 2023-02-03 NOTE — Progress Notes (Signed)
Physical Therapy Treatment Patient Details Name: Holly Beasley MRN: [id number] DOB: [birth year] Today's Date: 02/03/2023   History of Present Illness 37 uo female S/P L TKA 02/03/23. PMH: SVT, ACDF,DM    PT Comments    Patient now demonstrates improved left  leg motor control,and improved sensation. Patient ambulated x 40' x 2. Patient has met  goals for Dc to home.  Recommendations for follow up therapy are one component of a multi-disciplinary discharge planning process, led by the attending physician.  Recommendations may be updated based on patient status, additional functional criteria and insurance authorization.  Follow Up Recommendations       Assistance Recommended at Discharge Intermittent Supervision/Assistance  Patient can return home with the following A little help with walking and/or transfers;A little help with bathing/dressing/bathroom;Help with stairs or ramp for entrance;Assistance with cooking/housework;Assist for transportation   Equipment Recommendations  None recommended by PT    Recommendations for Other Services       Precautions / Restrictions Precautions Precautions: Knee;Fall Restrictions Weight Bearing Restrictions: No     Mobility  Bed Mobility Overal bed mobility: Needs Assistance Bed Mobility: Supine to Sit, Sit to Supine     Supine to sit: Min assist Sit to supine: Min assist   General bed mobility comments: support LLE    Transfers Overall transfer level: Needs assistance Equipment used: Rolling walker (2 wheels) Transfers: Sit to/from Stand Sit to Stand: Min guard           General transfer comment: cues for hand placement and most weight to RLE    Ambulation/Gait Ambulation/Gait assistance: Min guard Gait Distance (Feet): 40 Feet (x 2) Assistive device: Rolling walker (2 wheels) Gait Pattern/deviations: Step-to pattern       General Gait Details: Left knee stable for gait   Stairs              Wheelchair Mobility    Modified Rankin (Stroke Patients Only)       Balance Overall balance assessment: Mild deficits observed, not formally tested                                          Cognition Arousal/Alertness: Awake/alert Behavior During Therapy: WFL for tasks assessed/performed Overall Cognitive Status: Within Functional Limits for tasks assessed                                          Exercises T  General Comments        Pertinent Vitals/Pain Pain Assessment Pain Assessment: 0-10 Pain Score: 4  Pain Location: right knee Pain Descriptors / Indicators: Discomfort, Aching Pain Intervention(s): Monitored during session    Home Living Family/patient expects to be discharged to:: Private residence Living Arrangements: Spouse/significant other Available Help at Discharge: Family;Available 24 hours/day Type of Home: House Home Access: Stairs to enter   [employer] of Steps: 1/2   Home Layout: One level Home Equipment: Agricultural consultant (2 wheels);Rollator (4 wheels);Grab bars - toilet      Prior Function            PT Goals (current goals can now be found in the care plan section) Acute Rehab PT Goals Patient Stated Goal: go home PT Goal Formulation: With patient/family Time For Goal Achievement: 02/10/23 Potential to Achieve Goals: Good  Progress towards PT goals: Progressing toward goals    Frequency    7X/week      PT Plan Current plan remains appropriate    Co-evaluation              AM-PAC PT "6 Clicks" Mobility   Outcome Measure  Help needed turning from your back to your side while in a flat bed without using bedrails?: A Little Help needed moving from lying on your back to sitting on the side of a flat bed without using bedrails?: A Little Help needed moving to and from a bed to a chair (including a wheelchair)?: A Little Help needed standing up from a chair using your arms  (e.g., wheelchair or bedside chair)?: A Little Help needed to walk in hospital room?: A Little Help needed climbing 3-5 steps with a railing? : A Little 6 Click Score: 18    End of Session Equipment Utilized During Treatment: Gait belt Activity Tolerance: Patient tolerated treatment well Patient left: in bed;with call bell/phone within reach;with nursing/sitter in room Nurse Communication: Mobility status PT Visit Diagnosis: Unsteadiness on feet (R26.81);Muscle weakness (generalized) (M62.81);Pain;Other symptoms and signs involving the nervous system (R29.898) Pain - Right/Left: Left Pain - part of body: Knee     Time: 1415-1435 PT Time Calculation (min) (ACUTE ONLY): 20 min  Charges:  $Gait Training: 8-22 mins                     Blanchard Kelch PT Acute Rehabilitation Services Office [phone] Weekend pager-[phone]    Rada Hay 02/03/2023, 2:48 PM

## 2023-02-03 NOTE — Interval H&P Note (Signed)
History and Physical Interval Note:  02/03/2023 7:31 AM  Holly Beasley  has presented today for surgery, with the diagnosis of LEFT KNEE DEGENERATIVE JOINT DISEASE.  The various methods of treatment have been discussed with the patient and family. After consideration of risks, benefits and other options for treatment, the patient has consented to  Procedure(s): LEFT TOTAL KNEE ARTHROPLASTY (Left) as a surgical intervention.  The patient's history has been reviewed, patient examined, no change in status, stable for surgery.  I have reviewed the patient's chart and labs.  Questions were answered to the patient's satisfaction.     Velna Ochs

## 2023-02-03 NOTE — Anesthesia Procedure Notes (Signed)
Anesthesia Regional Block: Adductor canal block   Pre-Anesthetic Checklist: , timeout performed,  Correct Patient, Correct Site, Correct Laterality,  Correct Procedure, Correct Position, site marked,  Risks and benefits discussed,  Surgical consent,  Pre-op evaluation,  At surgeon's request and post-op pain management  Laterality: Left  Prep: chloraprep       Needles:  Injection technique: Single-shot  Needle Type: Echogenic Needle     Needle Length: 9cm      Additional Needles:   Procedures:,,,, ultrasound used (permanent image in chart),,    Narrative:  Start time: 02/03/2023 6:50 AM End time: 02/03/2023 6:56 AM Injection made incrementally with aspirations every 5 mL.  Performed by: Personally  Anesthesiologist: Eilene Ghazi, MD  Additional Notes: Patient tolerated the procedure well without complications

## 2023-02-03 NOTE — Anesthesia Procedure Notes (Signed)
Anesthesia Procedure Image    

## 2023-02-03 NOTE — Anesthesia Procedure Notes (Signed)
Procedure Name: MAC Date/Time: 02/03/2023 7:50 AM  Performed by: Jessica Priest, CRNAPre-anesthesia Checklist: Timeout performed, Patient being monitored, Suction available, Emergency Drugs available and Patient identified Patient Re-evaluated:Patient Re-evaluated prior to induction Oxygen Delivery Method: Simple face mask Preoxygenation: Pre-oxygenation with 100% oxygen Induction Type: IV induction Placement Confirmation: breath sounds checked- equal and bilateral, CO2 detector and positive ETCO2

## 2023-02-03 NOTE — Evaluation (Signed)
Physical Therapy Evaluation Patient Details Name: Holly Beasley MRN: [id number] DOB: 1951-08-22 Today's Date: 02/03/2023  History of Present Illness  31 uo female S/P L TKA 02/03/23. PMH: SVT, ACDF,DM  Clinical Impression  The patient has decreased sensation  left thigh and buttocks and decreased control of Left quads. Patient unable to  ambulate safely so will need more time for sensation and muscle power to return prior to Dc.  PT to continue for gait training.      Recommendations for follow up therapy are one component of a multi-disciplinary discharge planning process, led by the attending physician.  Recommendations may be updated based on patient status, additional functional criteria and insurance authorization.  Follow Up Recommendations       Assistance Recommended at Discharge Frequent or constant Supervision/Assistance  Patient can return home with the following  A little help with walking and/or transfers;A little help with bathing/dressing/bathroom;Help with stairs or ramp for entrance;Assistance with cooking/housework;Assist for transportation    Equipment Recommendations None recommended by PT  Recommendations for Other Services       Functional Status Assessment Patient has had a recent decline in their functional status and demonstrates the ability to make significant improvements in function in a reasonable and predictable amount of time.     Precautions / Restrictions Precautions Precautions: Knee;Fall Restrictions Weight Bearing Restrictions: No      Mobility  Bed Mobility Overal bed mobility: Needs Assistance Bed Mobility: Supine to Sit, Sit to Supine     Supine to sit: Min assist Sit to supine: Min assist   General bed mobility comments: support LLE    Transfers Overall transfer level: Needs assistance Equipment used: Rolling walker (2 wheels) Transfers: Sit to/from Stand Sit to Stand: Min assist           General transfer comment:  cues for hand placement and most weight to RLE    Ambulation/Gait Ambulation/Gait assistance: Mod assist Gait Distance (Feet): 3 Feet Assistive device: Rolling walker (2 wheels) Gait Pattern/deviations: Step-to pattern       General Gait Details: L knee buckling so not able to progress.  Stairs            Wheelchair Mobility    Modified Rankin (Stroke Patients Only)       Balance Overall balance assessment: Mild deficits observed, not formally tested                                           Pertinent Vitals/Pain Pain Assessment Pain Assessment: 0-10 Pain Score: 4  Pain Location: right knee Pain Descriptors / Indicators: Discomfort, Aching Pain Intervention(s): Monitored during session, Premedicated before session    Home Living Family/patient expects to be discharged to:: Private residence Living Arrangements: Spouse/significant other Available Help at Discharge: Family;Available 24 hours/day Type of Home: House Home Access: Stairs to enter   [employer] of Steps: 1/2   Home Layout: One level Home Equipment: Agricultural consultant (2 wheels);Rollator (4 wheels);Grab bars - toilet      Prior Function Prior Level of Function : Independent/Modified Independent                     Hand Dominance   Dominant Hand: Right    Extremity/Trunk Assessment   Upper Extremity Assessment Upper Extremity Assessment: Overall WFL for tasks assessed    Lower Extremity Assessment Lower Extremity Assessment: LLE  deficits/detail LLE Deficits / Details: SLR with lag,  thigh and buttocks with decrease sensation    Cervical / Trunk Assessment Cervical / Trunk Assessment: Normal  Communication   Communication: No difficulties  Cognition Arousal/Alertness: Awake/alert Behavior During Therapy: WFL for tasks assessed/performed Overall Cognitive Status: Within Functional Limits for tasks assessed                                           General Comments      Exercises Total Joint Exercises Ankle Circles/Pumps: AROM, Both, 10 reps Quad Sets: AROM, Both, 10 reps Heel Slides: AROM, Left, Supine Hip ABduction/ADduction: AAROM, 10 reps, Left, Supine Straight Leg Raises: AAROM, 10 reps, Left, Supine   Assessment/Plan    PT Assessment Patient needs continued PT services  PT Problem List Decreased strength;Decreased knowledge of precautions;Decreased range of motion;Decreased mobility;Impaired sensation;Decreased activity tolerance;Pain       PT Treatment Interventions DME instruction;Functional mobility training;Patient/family education;Gait training;Therapeutic activities;Therapeutic exercise    PT Goals (Current goals can be found in the Care Plan section)  Acute Rehab PT Goals Patient Stated Goal: go home PT Goal Formulation: With patient/family Time For Goal Achievement: 02/10/23 Potential to Achieve Goals: Good    Frequency 7X/week     Co-evaluation               AM-PAC PT "6 Clicks" Mobility  Outcome Measure Help needed turning from your back to your side while in a flat bed without using bedrails?: A Little Help needed moving from lying on your back to sitting on the side of a flat bed without using bedrails?: A Little Help needed moving to and from a bed to a chair (including a wheelchair)?: A Little Help needed standing up from a chair using your arms (e.g., wheelchair or bedside chair)?: Total Help needed to walk in hospital room?: Total Help needed climbing 3-5 steps with a railing? : Total 6 Click Score: 12    End of Session Equipment Utilized During Treatment: Gait belt Activity Tolerance: Patient tolerated treatment well Patient left: in bed;with call bell/phone within reach;with nursing/sitter in room Nurse Communication: Mobility status (not passed PT yet) PT Visit Diagnosis: Unsteadiness on feet (R26.81);Muscle weakness (generalized) (M62.81);Pain;Other symptoms and  signs involving the nervous system (R29.898) Pain - Right/Left: Left Pain - part of body: Knee    Time: 9629-5284 PT Time Calculation (min) (ACUTE ONLY): 24 min   Charges:   PT Evaluation $PT Eval Low Complexity: 1 Low PT Treatments $Gait Training: 8-22 mins        Blanchard Kelch PT Acute Rehabilitation Services Office [phone] Weekend pager-[phone]   Rada Hay 02/03/2023, 2:44 PM

## 2023-02-04 ENCOUNTER — Encounter (HOSPITAL_COMMUNITY): Payer: Self-pay | Admitting: Orthopaedic Surgery

## 2023-02-10 ENCOUNTER — Encounter (HOSPITAL_COMMUNITY): Payer: Self-pay | Admitting: Orthopaedic Surgery

## 2023-03-02 ENCOUNTER — Other Ambulatory Visit: Payer: Self-pay | Admitting: Internal Medicine

## 2023-03-13 ENCOUNTER — Other Ambulatory Visit: Payer: Self-pay | Admitting: Family

## 2023-03-28 ENCOUNTER — Other Ambulatory Visit: Payer: Self-pay | Admitting: Family

## 2023-04-01 ENCOUNTER — Other Ambulatory Visit: Payer: Self-pay | Admitting: Family

## 2023-04-05 ENCOUNTER — Other Ambulatory Visit: Payer: Self-pay | Admitting: Family

## 2023-04-05 ENCOUNTER — Other Ambulatory Visit: Payer: Self-pay | Admitting: Physician Assistant

## 2023-04-05 ENCOUNTER — Other Ambulatory Visit: Payer: Self-pay | Admitting: Internal Medicine

## 2023-04-13 NOTE — Progress Notes (Unsigned)
No chief complaint on file.   HPI: Patient  Holly Beasley  72 y.o. comes in today for Preventive Health Care visit  and cdm  HLD atorvastatin GI omepprazole Singulair Losartan Metformin  farxiga Cymbalta    Health Maintenance  Topic Date Due   FOOT EXAM  10/23/2020   Diabetic kidney evaluation - Urine ACR  04/30/2022   COVID-19 Vaccine (10 - 2023-24 season) 09/04/2022   OPHTHALMOLOGY EXAM  01/01/2023   INFLUENZA VACCINE  05/14/2023   HEMOGLOBIN A1C  07/24/2023   Diabetic kidney evaluation - eGFR measurement  01/22/2024   MAMMOGRAM  07/17/2024   Colonoscopy  12/10/2027   DTaP/Tdap/Td (3 - Td or Tdap) 10/23/2029   Pneumonia Vaccine 70+ Years old  Completed   DEXA SCAN  Completed   Hepatitis C Screening  Completed   Zoster Vaccines- Shingrix  Completed   HPV VACCINES  Aged Out   Health Maintenance Review LIFESTYLE:  Exercise:   Tobacco/ETS: Alcohol:  Sugar beverages: Sleep: Drug use: no HH of  Work:    ROS:  GEN/ HEENT: No fever, significant weight changes sweats headaches vision problems hearing changes, CV/ PULM; No chest pain shortness of breath cough, syncope,edema  change in exercise tolerance. GI /GU: No adominal pain, vomiting, change in bowel habits. No blood in the stool. No significant GU symptoms. SKIN/HEME: ,no acute skin rashes suspicious lesions or bleeding. No lymphadenopathy, nodules, masses.  NEURO/ PSYCH:  No neurologic signs such as weakness numbness. No depression anxiety. IMM/ Allergy: No unusual infections.  Allergy .   REST of 12 system review negative except as per HPI   Past Medical History:  Diagnosis Date   Allergy    seasonal,take shots   Cataract    bilateral,removed 2019   Depression    Diabetes mellitus without complication (HCC)    Dysrhythmia    SVTs   GERD (gastroesophageal reflux disease)    History of ETOH abuse    recovering  in remission   Hyperlipidemia    Hypertension    Osteoarthritis    Osteopenia     dexa -1.7 h -1.35    Scoliosis    mild scoliosis, leg length discrepancy   Skin cancer    basal cell nose   Sleep apnea    did not meet criteria for cpap-has mouthpiece not using per pt   Substance abuse (HCC)    etoh 40 years ago   SVT (supraventricular tachycardia) 08/15/2016   Varicose veins    Wears contact lenses     Past Surgical History:  Procedure Laterality Date   ANTERIOR FUSION CERVICAL SPINE     [phone]   BREAST BIOPSY     2003-rt   CATARACT EXTRACTION, BILATERAL  2019   COLONOSCOPY W/ POLYPECTOMY  0865,7846, 11-2022   precancerous   2004   DENTAL SURGERY  2015   bone graft    KNEE ARTHROSCOPY WITH LATERAL MENISECTOMY Left 11/10/2014   Procedure: LEFT KNEE ARTHROSCOPY PARTIAL LATERAL MENISECTOMY CHONDROPLASTY;  Surgeon: Velna Ochs, MD;  Location: [facility];  Service: Orthopedics;  Laterality: Left;   NASAL SINUS SURGERY  11/13/2008   polyps removed   skin cancer removal     basal cell nose 2004   TONSILLECTOMY AND ADENOIDECTOMY  1960   TOTAL KNEE ARTHROPLASTY Left 02/03/2023   Procedure: LEFT TOTAL KNEE ARTHROPLASTY;  Surgeon: Marcene Corning, MD;  Location: [facility];  Service: Orthopedics;  Laterality: Left;   VENOUS ABLATION Left 2015  Dr Hart [place]    Family History  Problem Relation Age of Onset   Lymphoma Mother    Diabetes Mother    Hyperlipidemia Mother    Colon polyps Mother    Heart failure Father    Hyperlipidemia Father    Arthritis Brother    Alcohol abuse Brother    Alcohol abuse Brother    Colon cancer Neg Hx    Esophageal cancer Neg Hx    Rectal cancer Neg Hx    Stomach cancer Neg Hx    Crohn's disease Neg Hx    Ulcerative colitis Neg Hx     Social History   Socioeconomic History   Marital status: Married    Spouse name: Not on file   Number of children: Not on file   Years of education: Not on file   Highest education level: Not on file  Occupational History   Not on file  Tobacco Use   Smoking status:  Never   Smokeless tobacco: Never  Vaping Use   Vaping Use: Never used  Substance and Sexual Activity   Alcohol use: No    Alcohol/week: 0.0 standard drinks of alcohol    Comment: recovering-1983   Drug use: No   Sexual activity: Not Currently  Other Topics Concern   Not on file  Social History Narrative   2 cats   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 For 2 weeks then once a day for 2 weeks. 45 tablet 0   atorvastatin (LIPITOR) 40 MG tablet TAKE 1 TABLET BY MOUTH EVERY DAY 90 tablet 0   azelastine (ASTELIN) 0.1 % nasal spray Place 2 sprays into both nostrils in the morning.     carvedilol (COREG) 12.5 MG tablet TAKE 1 TABLET BY MOUTH 2 TIMES DAILY. 180 tablet 3   cetirizine (ZYRTEC) 10 MG tablet Take 10 mg by mouth at bedtime.     Cholecalciferol (VITAMIN D3) 25 MCG (1000 UT) capsule Take 1,000 Units by mouth daily.     Continuous Blood Gluc Receiver (FREESTYLE LIBRE 14 DAY READER) DEVI 1 Units by Does not apply route daily. (Patient not taking: Reported on 12/09/2022) 1 each 0   Continuous Blood Gluc Sensor (FREESTYLE LIBRE 14 DAY SENSOR) MISC Check glucose daily (Patient not taking: Reported on 12/09/2022) 1 each 0   Cyanocobalamin (B-12) 1000 MCG TABS Take 1,000 mcg by mouth daily.     dapagliflozin propanediol (FARXIGA) 5 MG TABS tablet Take 1 tablet (5 mg total) by mouth daily. SCHEDULE ANNUAL APPT FOR FUTURE REFILLS 30 tablet 0   DULoxetine  (CYMBALTA) 60 MG capsule TAKE 2 CAPSULES BY MOUTH AT BEDTIME 180 capsule 1   EPINEPHrine 0.3 mg/0.3 mL IJ SOAJ injection Inject 0.3 mg into the muscle as needed for anaphylaxis.     losartan (COZAAR) 100 MG tablet TAKE  1 TABLET BY MOUTH EVERY DAY 90 tablet 1   Magnesium 400 MG TABS Take 400 mg by mouth daily.     metFORMIN (GLUCOPHAGE) 500 MG tablet TAKE 4 TABLETS BY MOUTH EVERY DAY WITH A MEAL 360 tablet 1   montelukast (SINGULAIR) 10 MG tablet TAKE 1 TABLET BY MOUTH EVERYDAY AT BEDTIME 90 tablet 0   Multiple Vitamins-Minerals (MULTIVITAMIN GUMMIES ADULTS PO) Take by mouth daily. Take 2 daily     omeprazole (PRILOSEC) 40 MG capsule TAKE 1 CAPSULE BY MOUTH EVERY DAY 90 capsule 0   oxyCODONE-acetaminophen (PERCOCET) 5-325 MG tablet Take 1-2 tablets by mouth every 6 (six) hours as needed for severe pain (post op pain). 40 tablet 0   tiZANidine (ZANAFLEX) 4 MG tablet Take 1 tablet (4 mg total) by mouth every 6 (six) hours as needed for muscle spasms. 40 tablet 1   No facility-administered medications prior to visit.     EXAM:  There were no vitals taken for this visit.  There is no height or weight on file to calculate BMI. Wt Readings from Last 3 Encounters:  02/03/23 170 lb (77.1 kg)  01/22/23 170 lb (77.1 kg)  12/09/22 170 lb (77.1 kg)    Physical Exam: Vital signs reviewed EAV:WUJW is a well-developed well-nourished alert cooperative    who appearsr stated age in no acute distress.  HEENT: normocephalic atraumatic , Eyes: PERRL EOM's full, conjunctiva clear, Nares: paten,t no deformity discharge or tenderness., Ears: no deformity EAC's clear TMs with normal landmarks. Mouth: clear OP, no lesions, edema.  Moist mucous membranes. Dentition in adequate repair. NECK: supple without masses, thyromegaly or bruits. CHEST/PULM:  Clear to auscultation and percussion breath sounds equal no wheeze , rales or rhonchi. No chest wall deformities or tenderness. Breast: normal by inspection . No  dimpling, discharge, masses, tenderness or discharge . CV: PMI is nondisplaced, S1 S2 no gallops, murmurs, rubs. Peripheral pulses are full without delay.No JVD .  ABDOMEN: Bowel sounds normal nontender  No guard or rebound, no hepato splenomegal no CVA tenderness.  No hernia. Extremtities:  No clubbing cyanosis or edema, no acute joint swelling or redness no focal atrophy NEURO:  Oriented x3, cranial nerves 3-12 appear to be intact, no obvious focal weakness,gait within normal limits no abnormal reflexes or asymmetrical SKIN: No acute rashes normal turgor, color, no bruising or petechiae. PSYCH: Oriented, good eye contact, no obvious depression anxiety, cognition and judgment appear normal. LN: no cervical axillary inguinal adenopathy  Lab Results  Component Value Date   WBC 6.3 01/22/2023   HGB 11.3 (L) 01/22/2023   HCT 35.9 (L) 01/22/2023   PLT 322 01/22/2023   GLUCOSE 186 (H) 01/22/2023   CHOL 183 04/30/2021   TRIG 191.0 (H) 04/30/2021   HDL 48.80 04/30/2021   LDLDIRECT 171.0 10/10/2016   LDLCALC 96 04/30/2021   ALT 19 01/22/2023   AST 18 01/22/2023   NA 136 01/22/2023   K 4.1 01/22/2023   CL 105 01/22/2023   CREATININE 0.82 01/22/2023   BUN 19 01/22/2023   CO2 23 01/22/2023   TSH 0.91 12/09/2021   HGBA1C 7.2 (H) 01/22/2023   MICROALBUR <0.7 04/30/2021    BP Readings from Last 3 Encounters:  02/03/23 134/78  01/22/23 120/63  12/09/22 120/65    Lab planreviewed with patient   ASSESSMENT AND PLAN:  Discussed the following assessment and plan:    ICD-10-CM   1. Essential hypertension  I10     2. Medication management  [phone]  3. Type 2 diabetes mellitus with hyperglycemia, without long-term current use of insulin (HCC)  E11.65     4. OSA (obstructive sleep apnea)  G47.33     5. Visit for preventive health examination  Z00.00      No follow-ups on file.  Patient Care Team: Idaly Verret, Neta Mends, MD as PCP - General Gelene Mink, [place] as Referring Physician  (Optometry) Venancio Poisson, MD as Consulting Physician (Dermatology) Archer Asa, MD as Consulting Physician (Psychiatry) Hillis Range, MD (Inactive) as Consulting Physician (Cardiology) Patricia Nettle, MD (Orthopedic Surgery) There are no Patient Instructions on file for this visit.  Neta Mends. Shey Yott M.D.

## 2023-04-14 ENCOUNTER — Ambulatory Visit (INDEPENDENT_AMBULATORY_CARE_PROVIDER_SITE_OTHER): Payer: 59 | Admitting: Internal Medicine

## 2023-04-14 ENCOUNTER — Encounter: Payer: Self-pay | Admitting: Internal Medicine

## 2023-04-14 VITALS — BP 128/80 | HR 85 | Temp 98.5°F | Ht 68.5 in | Wt 166.0 lb

## 2023-04-14 DIAGNOSIS — F4329 Adjustment disorder with other symptoms: Secondary | ICD-10-CM

## 2023-04-14 DIAGNOSIS — E1165 Type 2 diabetes mellitus with hyperglycemia: Secondary | ICD-10-CM | POA: Diagnosis not present

## 2023-04-14 DIAGNOSIS — I10 Essential (primary) hypertension: Secondary | ICD-10-CM

## 2023-04-14 DIAGNOSIS — Z79899 Other long term (current) drug therapy: Secondary | ICD-10-CM | POA: Diagnosis not present

## 2023-04-14 DIAGNOSIS — Z0000 Encounter for general adult medical examination without abnormal findings: Secondary | ICD-10-CM

## 2023-04-14 DIAGNOSIS — G4733 Obstructive sleep apnea (adult) (pediatric): Secondary | ICD-10-CM

## 2023-04-14 DIAGNOSIS — E785 Hyperlipidemia, unspecified: Secondary | ICD-10-CM

## 2023-04-14 DIAGNOSIS — Z7984 Long term (current) use of oral hypoglycemic drugs: Secondary | ICD-10-CM

## 2023-04-14 LAB — MICROALBUMIN / CREATININE URINE RATIO
Creatinine,U: 58.9 mg/dL
Microalb Creat Ratio: 1.2 mg/g (ref 0.0–30.0)
Microalb, Ur: <0.7 mg/dL (ref 0.0–1.9)

## 2023-04-14 LAB — CBC WITH DIFFERENTIAL/PLATELET
Basophils Absolute: 0.1 10*3/uL (ref 0.0–0.1)
Basophils Relative: 1.2 % (ref 0.0–3.0)
Eosinophils Absolute: 0.5 10*3/uL (ref 0.0–0.7)
Eosinophils Relative: 7.3 % — ABNORMAL HIGH (ref 0.0–5.0)
HCT: 34.4 % — ABNORMAL LOW (ref 36.0–46.0)
Hemoglobin: 11.1 g/dL — ABNORMAL LOW (ref 12.0–15.0)
Lymphocytes Relative: 43.9 % (ref 12.0–46.0)
Lymphs Abs: 3.2 10*3/uL (ref 0.7–4.0)
MCHC: 32.4 g/dL (ref 30.0–36.0)
MCV: 82.5 fl (ref 78.0–100.0)
Monocytes Absolute: 0.7 10*3/uL (ref 0.1–1.0)
Monocytes Relative: 8.9 % (ref 3.0–12.0)
Neutro Abs: 2.8 10*3/uL (ref 1.4–7.7)
Neutrophils Relative %: 38.7 % — ABNORMAL LOW (ref 43.0–77.0)
Platelets: 395 10*3/uL (ref 150.0–400.0)
RBC: 4.17 Mil/uL (ref 3.87–5.11)
RDW: 15.7 % — ABNORMAL HIGH (ref 11.5–15.5)
WBC: 7.4 10*3/uL (ref 4.0–10.5)

## 2023-04-14 LAB — LIPID PANEL
Cholesterol: 163 mg/dL (ref 0–200)
HDL: 57.2 mg/dL (ref >39.00)
LDL Cholesterol: 89 mg/dL (ref 0–99)
NonHDL: 105.32
Total CHOL/HDL Ratio: 3
Triglycerides: 83 mg/dL (ref 0.0–149.0)
VLDL: 16.6 mg/dL (ref 0.0–40.0)

## 2023-04-14 LAB — BASIC METABOLIC PANEL
BUN: 14 mg/dL (ref 6–23)
CO2: 26 mEq/L (ref 19–32)
Calcium: 9.9 mg/dL (ref 8.4–10.5)
Chloride: 103 mEq/L (ref 96–112)
Creatinine, Ser: 0.83 mg/dL (ref 0.40–1.20)
GFR: 70.58 mL/min (ref >60.00)
Glucose, Bld: 155 mg/dL — ABNORMAL HIGH (ref 70–99)
Potassium: 4.2 mEq/L (ref 3.5–5.1)
Sodium: 138 mEq/L (ref 135–145)

## 2023-04-14 LAB — HEPATIC FUNCTION PANEL
ALT: 17 U/L (ref 0–35)
AST: 17 U/L (ref 0–37)
Albumin: 4.2 g/dL (ref 3.5–5.2)
Alkaline Phosphatase: 69 U/L (ref 39–117)
Bilirubin, Direct: 0.1 mg/dL (ref 0.0–0.3)
Total Bilirubin: 0.4 mg/dL (ref 0.2–1.2)
Total Protein: 7.3 g/dL (ref 6.0–8.3)

## 2023-04-14 LAB — VITAMIN B12: Vitamin B-12: 1087 pg/mL — ABNORMAL HIGH (ref 211–911)

## 2023-04-14 LAB — HEMOGLOBIN A1C: Hgb A1c MFr Bld: 7.4 % — ABNORMAL HIGH (ref 4.6–6.5)

## 2023-04-14 LAB — TSH: TSH: 2 u[IU]/mL (ref 0.35–5.50)

## 2023-04-14 MED ORDER — DAPAGLIFLOZIN PROPANEDIOL 5 MG PO TABS: 5.0000 mg | ORAL_TABLET | Freq: Every day | ORAL | 2 refills | Status: DC

## 2023-04-14 NOTE — Patient Instructions (Addendum)
Good to see you today  Consider counseling as discussed . Lab today . If all ok then 6 mos visit .

## 2023-04-20 NOTE — Progress Notes (Signed)
Thyroid kidneys normal , A1c 7.4 up from 7.2  still acceptable but need to pay attention to keep from increasing .  Cholesterol  some improved .  Mild anemia  persists from last time not alarming but needs followup .  B12 is high  so not contributing to anemia .    Plan cbc diff and ferritin and ibc panel and hg A1c  in 3 months to make sure stable . FU visit  virtual ok with results if   needed

## 2023-05-11 ENCOUNTER — Telehealth: Payer: Self-pay | Admitting: Physician Assistant

## 2023-05-11 ENCOUNTER — Other Ambulatory Visit: Payer: Self-pay | Admitting: *Deleted

## 2023-05-11 MED ORDER — CARVEDILOL 12.5 MG PO TABS: 12.5000 mg | ORAL_TABLET | Freq: Two times a day (BID) | ORAL | 0 refills | Status: DC

## 2023-05-11 NOTE — Telephone Encounter (Signed)
*  STAT* If patient is at the pharmacy, call can be transferred to refill team.   1. Which medications need to be refilled? (please list name of each medication and dose if known) carvedilol (COREG) 12.5 MG tablet    4. Which pharmacy/location (including street and city if local pharmacy) is medication to be sent to?  [id number] - [place],  - [address] [facility] [address] [address]     5. Do they need a 30 day or 90 day supply? Needs enough medication to last until 08/29 appt.  Patient is completely out of medication.

## 2023-05-30 ENCOUNTER — Other Ambulatory Visit: Payer: Self-pay | Admitting: Family

## 2023-06-09 NOTE — Progress Notes (Unsigned)
Cardiology Office Note Date:  06/09/2023  Patient ID:  Holly Beasley, Holly Beasley [birth year], MRN [id number] PCP:  Madelin Headings, MD  Electrophysiologist: Dr. Johney Frame    Chief Complaint: *** annual visit  History of Present Illness: Holly Beasley is a 72 y.o. female with history of HTN, HLD, DM, GERD, SVT (short RP, adenosine sensitive).  She comes in today to be seen for Dr. Johney Frame, last seen by him May 2022, at that time he mentions reports of progressive SOB and fatigue, chronic edema as stable, reduced activity 2/2 knee pain. Planned for echo, stress test, her coreg reduced She had not had any SVT  TTE with LVEF 60-65%, no WMA Stres test was normal, low risk  Pt called with months of symptoms since a COVID infection Aug 2022 Saw her PMD Nov 2022, COVID aug 2022, starting to feel better, less fatigue and SOB  I saw her 12/06/21 She has number of symptoms/worries Orthostatic dizziness, not every time she stands up, but often, more so after sitting a long time Marked fatigue This goes back perhaps a year or so, worse after COVID but there before as well, a constant sense of having no energy 3.     Remains with brain fog after COVID 4.    Had an episode of room spinning vertigo in bed a couple nights ago 5.    B/l hand numbness that comes/goes She has excellent and equal radial pulses Pending a neck MRI  She has never had SVT again after the one event, worries it could have done something to her that has caused these symptoms to evolve now No CP, no palpitations No syncope  No symptoms with mildly abnormal orthostatics, losartan was reduced some, encouraged hydration. Did not think SVT years ago causing symptoms or damaged t he heart. Recommended that she see her PMD.  She did see her PMD,  farxiga was stopped, was pending f/u with Dr. Samara Snide s/p C-spine   01/13/22 C4-5 and 6-6 fusion  Saw PMD 02/25/22, ongoing/escalating vertigo referred to ENT  I saw her 03/11/22 She has  not had any further orthostatic symptoms since the reduction in her losartan No CP, palpitations or cardiac awareness No SVT No near syncope or syncope. She is feeling better Mild vertigo symptoms, pending ENT visit  BP slightly elevated, advised low sodium, with orthostatic history, meds not changed/advanced. Planned for an annual visit  *** syncope? Symptoms? Orthostatic? *** BP *** SVT? *** needs a new EP MD   Past Medical History:  Diagnosis Date   Allergy    seasonal,take shots   Cataract    bilateral,removed 2019   Depression    Diabetes mellitus without complication (HCC)    Dysrhythmia    SVTs   GERD (gastroesophageal reflux disease)    History of ETOH abuse    recovering  in remission   Hyperlipidemia    Hypertension    Osteoarthritis    Osteopenia    dexa -1.7 h -1.35    Scoliosis    mild scoliosis, leg length discrepancy   Skin cancer    basal cell nose   Sleep apnea    did not meet criteria for cpap-has mouthpiece not using per pt   Substance abuse (HCC)    etoh 40 years ago   SVT (supraventricular tachycardia) 08/15/2016   Varicose veins    Wears contact lenses     Past Surgical History:  Procedure Laterality Date   ANTERIOR FUSION CERVICAL SPINE  [phone]   BREAST BIOPSY     2003-rt   CATARACT EXTRACTION, BILATERAL  2019   COLONOSCOPY W/ POLYPECTOMY  2004,2010, 11-2022   precancerous   2004   DENTAL SURGERY  2015   bone graft    KNEE ARTHROSCOPY WITH LATERAL MENISECTOMY Left 11/10/2014   Procedure: LEFT KNEE ARTHROSCOPY PARTIAL LATERAL MENISECTOMY CHONDROPLASTY;  Surgeon: Velna Ochs, MD;  Location: [facility];  Service: Orthopedics;  Laterality: Left;   NASAL SINUS SURGERY  11/13/2008   polyps removed   skin cancer removal     basal cell nose 2004   TONSILLECTOMY AND ADENOIDECTOMY  1960   TOTAL KNEE ARTHROPLASTY Left 02/03/2023   Procedure: LEFT TOTAL KNEE ARTHROPLASTY;  Surgeon: Marcene Corning, MD;  Location: [facility];  Service: Orthopedics;  Laterality: Left;   VENOUS ABLATION Left 2015   Dr Hart [place]    Current Outpatient Medications  Medication Sig Dispense Refill   atorvastatin (LIPITOR) 40 MG tablet TAKE 1 TABLET BY MOUTH EVERY DAY 90 tablet 0   azelastine (ASTELIN) 0.1 % nasal spray Place 2 sprays into both nostrils in the morning.     carvedilol (COREG) 12.5 MG tablet Take 1 tablet (12.5 mg total) by mouth 2 (two) times daily. 180 tablet 0   cetirizine (ZYRTEC) 10 MG tablet Take 10 mg by mouth at bedtime.     Cholecalciferol (VITAMIN D3) 25 MCG (1000 UT) capsule Take 1,000 Units by mouth daily.     Cyanocobalamin (B-12) 1000 MCG TABS Take 1,000 mcg by mouth daily.     dapagliflozin propanediol (FARXIGA) 5 MG TABS tablet Take 1 tablet (5 mg total) by mouth daily. 90 tablet 2   DULoxetine (CYMBALTA) 60 MG capsule TAKE 2 CAPSULES BY MOUTH AT BEDTIME 180 capsule 1   EPINEPHrine 0.3 mg/0.3 mL IJ SOAJ injection Inject 0.3 mg into the muscle as needed for anaphylaxis.     losartan (COZAAR) 100 MG tablet TAKE 1 TABLET BY MOUTH EVERY DAY 90 tablet 1   Magnesium 400 MG TABS Take 400 mg by mouth daily.     metFORMIN (GLUCOPHAGE) 500 MG tablet TAKE 4 TABLETS BY MOUTH EVERY DAY WITH A MEAL 360 tablet 1   montelukast (SINGULAIR) 10 MG tablet TAKE 1 TABLET BY MOUTH EVERYDAY AT BEDTIME 90 tablet 0   Multiple Vitamins-Minerals (MULTIVITAMIN GUMMIES ADULTS PO) Take by mouth daily. Take 2 daily     omeprazole (PRILOSEC) 40 MG capsule TAKE 1 CAPSULE BY MOUTH EVERY DAY 90 capsule 0   No current facility-administered medications for this visit.    Allergies:   Bactrim [sulfamethoxazole-trimethoprim], Sulfamethoxazole, Meloxicam, Amoxicillin, and Cephalexin   Social History:  The patient  reports that she has never smoked. She has never used smokeless tobacco. She reports that she does not drink alcohol and does not use drugs.   Family History:  The patient's family history includes Alcohol abuse in her brother and  brother; Arthritis in her brother; Colon polyps in her mother; Diabetes in her mother; Heart failure in her father; Hyperlipidemia in her father and mother; Lymphoma in her mother. ROS:  Please see the history of present illness.    All other systems are reviewed and otherwise negative.   PHYSICAL EXAM:  VS:  There were no vitals taken for this visit. BMI: There is no height or weight on file to calculate BMI. Well nourished, well developed, in no acute distress HEENT: normocephalic, atraumatic Neck: no JVD, carotid bruits or masses Cardiac: *** RRR; no  significant murmurs, no rubs, or gallops Lungs: *** CTA b/l, no wheezing, rhonchi or rales Abd: soft, nontender MS: no deformity or atrophy Ext: *** no edema Skin: warm and dry, no rash Neuro:  No gross deficits appreciated Psych: euthymic mood, full affect    EKG:  not done today  03/14/21; TTE IMPRESSIONS   1. Left ventricular ejection fraction, by estimation, is 60 to 65%. The  left ventricle has normal function. The left ventricle has no regional  wall motion abnormalities. There is mild left ventricular hypertrophy.  Left ventricular diastolic parameters  were normal.   2. Right ventricular systolic function is normal. The right ventricular  size is normal. There is normal pulmonary artery systolic pressure.   3. The mitral valve is normal in structure. No evidence of mitral valve  regurgitation. No evidence of mitral stenosis.   4. The aortic valve is tricuspid. There is mild calcification of the  aortic valve. There is mild thickening of the aortic valve. Aortic valve  regurgitation is not visualized. Mild aortic valve sclerosis is present,  with no evidence of aortic valve  stenosis.   5. The inferior vena cava is normal in size with greater than 50%  respiratory variability, suggesting right atrial pressure of 3 mmHg.   Comparison(s): No significant change from prior study.    02/21/21: Stress myoview The left  ventricular ejection fraction is normal (55-65%). Nuclear stress EF: 62%. There was no ST segment deviation noted during stress. The study is normal. This is a low risk study.   Recent Labs: 04/14/2023: ALT 17; BUN 14; Creatinine, Ser 0.83; Hemoglobin 11.1; Platelets 395.0; Potassium 4.2; Sodium 138; TSH 2.00  04/14/2023: Cholesterol 163; HDL 57.20; LDL Cholesterol 89; Total CHOL/HDL Ratio 3; Triglycerides 83.0; VLDL 16.6   CrCl cannot be calculated (Patient's most recent lab result is older than the maximum 21 days allowed.).   Wt Readings from Last 3 Encounters:  04/14/23 166 lb (75.3 kg)  02/03/23 170 lb (77.1 kg)  01/22/23 170 lb (77.1 kg)     Other studies reviewed: Additional studies/records reviewed today include: summarized above  ASSESSMENT AND PLAN:  SVT *** Single episode years ago  HTN *** With hx of orthostatic symptoms, continue without changes Encouraged low salt, some weight loss,exercise She will monitor at home  Orthostatic lightheadedness Negative orthostatic vitals today *** remains resolved   Disposition: ***     Current medicines are reviewed at length with the patient today.  The patient did not have any concerns regarding medicines.  Norma Fredrickson, PA-C 06/09/2023 12:44 PM     [facility] [address] [place] [address] [phone] (office)  [phone] (fax)

## 2023-06-11 ENCOUNTER — Ambulatory Visit: Payer: 59 | Attending: Physician Assistant | Admitting: Physician Assistant

## 2023-06-11 ENCOUNTER — Encounter: Payer: Self-pay | Admitting: Physician Assistant

## 2023-06-11 VITALS — BP 130/62 | HR 71 | Ht 68.5 in | Wt 172.0 lb

## 2023-06-11 DIAGNOSIS — I10 Essential (primary) hypertension: Secondary | ICD-10-CM | POA: Diagnosis not present

## 2023-06-11 DIAGNOSIS — I471 Supraventricular tachycardia, unspecified: Secondary | ICD-10-CM | POA: Diagnosis not present

## 2023-06-11 MED ORDER — CARVEDILOL 12.5 MG PO TABS: 12.5000 mg | ORAL_TABLET | Freq: Two times a day (BID) | ORAL | 3 refills | Status: DC

## 2023-06-11 MED ORDER — LOSARTAN POTASSIUM 50 MG PO TABS: 50.0000 mg | ORAL_TABLET | Freq: Every day | ORAL | 3 refills | Status: DC

## 2023-06-11 NOTE — Patient Instructions (Addendum)
Medication Instructions:   Your physician recommends that you continue on your current medications as directed. Please refer to the Current Medication list given to you today.  *If you need a refill on your cardiac medications before your next appointment, please call your pharmacy*   Lab Work:  NONE ORDERED  TODAY   If you have labs (blood work) drawn today and your tests are completely normal, you will receive your results only by: MyChart Message (if you have MyChart) OR A paper copy in the mail If you have any lab test that is abnormal or we need to change your treatment, we will call you to review the results.   Testing/Procedures: .NONE ORDERED  TODAY     Follow-Up: At [facility], you and your health needs are our priority.  As part of our continuing mission to provide you with exceptional heart care, we have created designated Provider Care Teams.  These Care Teams include your primary Cardiologist (physician) and Advanced Practice Providers (APPs -  Physician Assistants and Nurse Practitioners) who all work together to provide you with the care you need, when you need it.  We recommend signing up for the patient portal called "MyChart".  Sign up information is provided on this After Visit Summary.  MyChart is used to connect with patients for Virtual Visits (Telemedicine).  Patients are able to view lab/test results, encounter notes, upcoming appointments, etc.  Non-urgent messages can be sent to your provider as well.   To learn more about what you can do with MyChart, go to [email or web address].    Your next appointment:   1 year(s)  Provider:   Cameron Lambert, MD    Other Instructions  

## 2023-07-13 ENCOUNTER — Other Ambulatory Visit: Payer: Self-pay | Admitting: Family

## 2023-07-13 ENCOUNTER — Telehealth: Payer: Self-pay | Admitting: Internal Medicine

## 2023-07-13 MED ORDER — ATORVASTATIN CALCIUM 40 MG PO TABS: 40.0000 mg | ORAL_TABLET | Freq: Every day | ORAL | 0 refills | Status: DC

## 2023-07-13 NOTE — Telephone Encounter (Signed)
Prescription Request  07/13/2023  LOV: 04/14/2023  What is the name of the medication or equipment? atorvastatin (LIPITOR) 40 MG tablet   Have you contacted your pharmacy to request a refill? No   Which pharmacy would you like this sent to?  [id number] - [place], [place] - [address] AT [address] OF [facility] [address] [place] [address] Phone: [phone] Fax: [phone]    Patient notified that their request is being sent to the clinical staff for review and that they should receive a response within 2 business days.   Please advise at Mobile There is no such number on file (mobile).

## 2023-07-16 ENCOUNTER — Telehealth: Payer: Self-pay | Admitting: Internal Medicine

## 2023-07-16 NOTE — Telephone Encounter (Signed)
Pt called to say she thinks MD wants her to come in for labs. I did not find any active requests for labs, in Pt's chart.  Please advise, or kindly enter labs, so that I may schedule Pt.

## 2023-07-23 NOTE — Telephone Encounter (Signed)
Attempted to reach pt. Left a detail message for pt and call us back if have any questions.

## 2023-08-23 ENCOUNTER — Other Ambulatory Visit: Payer: Self-pay | Admitting: Family

## 2023-08-31 ENCOUNTER — Other Ambulatory Visit: Payer: Self-pay | Admitting: Internal Medicine

## 2023-08-31 ENCOUNTER — Other Ambulatory Visit: Payer: Self-pay

## 2023-08-31 DIAGNOSIS — Z1231 Encounter for screening mammogram for malignant neoplasm of breast: Secondary | ICD-10-CM

## 2023-08-31 MED ORDER — OMEPRAZOLE 40 MG PO CPDR: DELAYED_RELEASE_CAPSULE | ORAL | 0 refills | Status: DC

## 2023-08-31 NOTE — Telephone Encounter (Signed)
Pt also needs omeprazole (PRILOSEC) 40 MG capsule

## 2023-09-22 LAB — HM DIABETES EYE EXAM

## 2023-09-30 ENCOUNTER — Encounter: Payer: Self-pay | Admitting: Internal Medicine

## 2023-10-02 ENCOUNTER — Ambulatory Visit
Admission: RE | Admit: 2023-10-02 | Discharge: 2023-10-02 | Disposition: A | Discharge status: Home / Self Care | Payer: 59 | Source: Ambulatory Visit | Attending: Internal Medicine | Admitting: Internal Medicine

## 2023-10-02 DIAGNOSIS — Z1231 Encounter for screening mammogram for malignant neoplasm of breast: Secondary | ICD-10-CM

## 2023-10-21 NOTE — Progress Notes (Signed)
 Chief Complaint  Patient presents with   Medical Management of Chronic Issues    HPI: Holly Beasley 73 y.o. come in for 6 mos fu Chronic disease management  a couple if issues  Dm :metformin  500 x 4 per day could be better with diet , farxiga  5  Cymbalata no change  Singulair   Omeprazole   still taking  Concern can she be checked for RA   as co Joint problems  hands thumbs and knees  can ache Brother had  dx of RA   Thumbs hits and knees  all the time worse.  No redness fever swelling  Fatigue  on going without fever new rash  Resp  no change  Bp  tolerating med  ROS: See pertinent positives and negatives per HPI.  Past Medical History:  Diagnosis Date   Allergy    seasonal,take shots   Cataract    bilateral,removed 2019   Depression    Diabetes mellitus without complication (HCC)    Dysrhythmia    SVTs   GERD (gastroesophageal reflux disease)    History of ETOH abuse    recovering  in remission   Hyperlipidemia    Hypertension    Osteoarthritis    Osteopenia    dexa -1.7 h -1.35    Scoliosis    mild scoliosis, leg length discrepancy   Skin cancer    basal cell nose   Sleep apnea    did not meet criteria for cpap-has mouthpiece not using per pt   Substance abuse (HCC)    etoh 40 years ago   SVT (supraventricular tachycardia) (HCC) 08/15/2016   Varicose veins    Wears contact lenses     Family History  Problem Relation Age of Onset   Lymphoma Mother    Diabetes Mother    Hyperlipidemia Mother    Colon polyps Mother    Heart failure Father    Hyperlipidemia Father    Arthritis Brother    Alcohol abuse Brother    Alcohol abuse Brother    Colon cancer Neg Hx    Esophageal cancer Neg Hx    Rectal cancer Neg Hx    Stomach cancer Neg Hx    Crohn's disease Neg Hx    Ulcerative colitis Neg Hx    BRCA 1/2 Neg Hx    Breast cancer Neg Hx     Social History   Socioeconomic History   Marital status: Married    Spouse name: Not on file   Number of  children: Not on file   Years of education: Not on file   Highest education level: Not on file  Occupational History   Not on file  Tobacco Use   Smoking status: Never   Smokeless tobacco: Never  Vaping Use   Vaping status: Never Used  Substance and Sexual Activity   Alcohol use: No    Alcohol/week: 0.0 standard drinks of alcohol    Comment: recovering-1983   Drug use: No   Sexual activity: Not Currently  Other Topics Concern   Not on file  Social History Narrative   2 cats   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    Retired equities trader with [employer]   Married   ETOH recovering   Hhof 2    Has family in [place]         Social Drivers of Corporate Investment Banker Strain: Not on [employer]  Insecurity: Not on file  Transportation Needs: Not on file  Physical Activity: Not on file  Stress: Not on file  Social Connections: Not on file    Outpatient Medications Prior to Visit  Medication Sig Dispense Refill   atorvastatin  (LIPITOR) 40 MG tablet TAKE 1 TABLET BY MOUTH EVERY DAY 90 tablet 0   azelastine (ASTELIN) 0.1 % nasal spray Place 2 sprays into both nostrils in the morning.     carvedilol  (COREG ) 12.5 MG tablet Take 1 tablet (12.5 mg total) by mouth 2 (two) times daily. 180 tablet 3   cetirizine (ZYRTEC) 10 MG tablet Take 10 mg by mouth at bedtime.     Cholecalciferol (VITAMIN D3) 25 MCG (1000 UT) capsule Take 1,000 Units by mouth daily.     Cyanocobalamin  (B-12) 1000 MCG TABS Take 1,000 mcg by mouth daily.     DULoxetine  (CYMBALTA ) 60 MG capsule TAKE 2 CAPSULES BY MOUTH AT BEDTIME 180 capsule 1   EPINEPHrine  0.3 mg/0.3 mL IJ SOAJ injection Inject 0.3 mg into the muscle as needed for anaphylaxis.     losartan  (COZAAR ) 50 MG tablet Take 1 tablet (50 mg total) by mouth daily. 90 tablet 3   Magnesium 400 MG TABS Take 400 mg by mouth daily.     metFORMIN  (GLUCOPHAGE ) 500 MG tablet TAKE 4 TABLETS BY MOUTH EVERY DAY WITH A MEAL  360 tablet 1   montelukast  (SINGULAIR ) 10 MG tablet TAKE 1 TABLET BY MOUTH EVERYDAY AT BEDTIME 90 tablet 0   Multiple Vitamins-Minerals (MULTIVITAMIN GUMMIES ADULTS PO) Take by mouth daily. Take 2 daily     omeprazole  (PRILOSEC) 40 MG capsule TAKE 1 CAPSULE BY MOUTH EVERY DAY 90 capsule 0   dapagliflozin  propanediol (FARXIGA ) 5 MG TABS tablet Take 1 tablet (5 mg total) by mouth daily. 90 tablet 2   No facility-administered medications prior to visit.     EXAM:  BP (!) 148/80 (BP Location: Right Arm, Patient Position: Sitting, Cuff Size: Normal)   Pulse 79   Temp 98.1 F (36.7 C) (Oral)   Ht 5' 8.5 (1.74 m)   Wt 178 lb 12.8 oz (81.1 kg)   SpO2 97%   BMI 26.79 kg/m   Body mass index is 26.79 kg/m.  GENERAL: vitals reviewed and listed above, alert, oriented, appears well hydrated and in no acute distress HEENT: atraumatic, conjunctiva  clear, no obvious abnormalities on inspection of external nose and ears  NECK: no obvious masses on inspection palpation  LUNGS: clear to auscultation bilaterally, no wheezes, rales or rhonchi, good air movement CV: HRRR, no clubbing cyanosis or  peripheral edema nl cap refill  MS: moves all extremities no acaute swelling  oa changes base of thumbs no deviation or effusion note  PSYCH: pleasant and cooperative, no obvious depression or anxiety Lab Results  Component Value Date   WBC 7.4 04/14/2023   HGB 11.1 (L) 04/14/2023   HCT 34.4 (L) 04/14/2023   PLT 395.0 04/14/2023   GLUCOSE 195 (H) 10/22/2023   CHOL 163 04/14/2023   TRIG 83.0 04/14/2023   HDL 57.20 04/14/2023   LDLDIRECT 171.0 10/10/2016   LDLCALC 89 04/14/2023   ALT 17 04/14/2023   AST 17 04/14/2023   NA 139 10/22/2023   K 4.3 10/22/2023   CL 104 10/22/2023   CREATININE 0.83 10/22/2023   BUN 13 10/22/2023   CO2 28 10/22/2023   TSH 1.40 10/22/2023   HGBA1C 7.8 (A) 10/22/2023   MICROALBUR <0.7 04/14/2023   BP Readings from Last  3 Encounters:  10/22/23 (!) 148/80  06/11/23  130/62  04/14/23 128/80   Lab Results  Component Value Date   VITAMINB12 1,087 (H) 04/14/2023    ASSESSMENT AND PLAN:  Discussed the following assessment and plan:  Type 2 diabetes mellitus with hyperglycemia, without long-term current use of insulin (HCC) - Plan: POC HgB A1c, Vitamin D , 25-hydroxy, Iron, TIBC and Ferritin Panel, Cyclic citrul peptide antibody, IgG, ANA, C-reactive protein, Sedimentation rate, Sjogrens syndrome-A extractable nuclear antibody, CK, Rheumatoid Factor, Basic metabolic panel, TSH  Essential hypertension - Plan: Vitamin D , 25-hydroxy, Iron, TIBC and Ferritin Panel, Cyclic citrul peptide antibody, IgG, ANA, C-reactive protein, Sedimentation rate, Sjogrens syndrome-A extractable nuclear antibody, CK, Rheumatoid Factor, Basic metabolic panel, TSH  Medication management - Plan: Vitamin D , 25-hydroxy, Iron, TIBC and Ferritin Panel, Cyclic citrul peptide antibody, IgG, ANA, C-reactive protein, Sedimentation rate, Sjogrens syndrome-A extractable nuclear antibody, CK, Rheumatoid Factor, Basic metabolic panel, TSH  Hyperlipidemia, unspecified hyperlipidemia type - Plan: Vitamin D , 25-hydroxy, Iron, TIBC and Ferritin Panel, Cyclic citrul peptide antibody, IgG, ANA, C-reactive protein, Sedimentation rate, Sjogrens syndrome-A extractable nuclear antibody, CK, Rheumatoid Factor, Basic metabolic panel, TSH  Anemia, unspecified type - Plan: Vitamin D , 25-hydroxy, Iron, TIBC and Ferritin Panel, Cyclic citrul peptide antibody, IgG, ANA, C-reactive protein, Sedimentation rate, Sjogrens syndrome-A extractable nuclear antibody, CK, Rheumatoid Factor, Basic metabolic panel, TSH  Stiffness in joint - Plan: Vitamin D , 25-hydroxy, Iron, TIBC and Ferritin Panel, Cyclic citrul peptide antibody, IgG, ANA, C-reactive protein, Sedimentation rate, Sjogrens syndrome-A extractable nuclear antibody, CK, Rheumatoid Factor, Basic metabolic panel, TSH  Family history of rheumatoid arthritis - Plan:  Vitamin D , 25-hydroxy, Iron, TIBC and Ferritin Panel, Cyclic citrul peptide antibody, IgG, ANA, C-reactive protein, Sedimentation rate, Sjogrens syndrome-A extractable nuclear antibody, CK, Rheumatoid Factor, Basic metabolic panel, TSH  Other fatigue - Plan: Vitamin D , 25-hydroxy, Iron, TIBC and Ferritin Panel, Cyclic citrul peptide antibody, IgG, ANA, C-reactive protein, Sedimentation rate, Sjogrens syndrome-A extractable nuclear antibody, CK, Rheumatoid Factor, Basic metabolic panel, TSH Vit d supp  may be 1000 per day  Updated lab today  Inc farxiga   to 10 mg per day today A1c I 7.8 Monitor BP for goal readings . Has mild anemia and  fu no  bleeding  Consider rheum consults but lab screening first and trial of atorva even though not a joint effect abut sometimes stiff ness could be related Seems more  like oa  but advise screening lab referral as indicated since effecting her  daily living efforts  Asks about gyne  but not sure if has hx of follow sx of concern and risk  last gyne left?  -Patient advised to return or notify health care team  if  new concerns arise.  Patient Instructions   Will update labs to help assess the stiffness and joint pains. And we can get a rheumatology opinion also .  Will increase farxiga   10 mg  per day. Will update  iron check .   We can consider going off atorvastatin  for a few weeks and see if  stiffness is better and then rechallenge   Send in a message about gyne and  effect of staying off atorvastatin  for a few weeks .    [place] Holly Beasley M.D.

## 2023-10-22 ENCOUNTER — Ambulatory Visit: Payer: 59 | Admitting: Internal Medicine

## 2023-10-22 ENCOUNTER — Encounter: Payer: Self-pay | Admitting: Internal Medicine

## 2023-10-22 VITALS — BP 148/80 | HR 79 | Temp 98.1°F | Ht 68.5 in | Wt 178.8 lb

## 2023-10-22 DIAGNOSIS — E1165 Type 2 diabetes mellitus with hyperglycemia: Secondary | ICD-10-CM

## 2023-10-22 DIAGNOSIS — Z79899 Other long term (current) drug therapy: Secondary | ICD-10-CM

## 2023-10-22 DIAGNOSIS — E785 Hyperlipidemia, unspecified: Secondary | ICD-10-CM | POA: Diagnosis not present

## 2023-10-22 DIAGNOSIS — R5383 Other fatigue: Secondary | ICD-10-CM

## 2023-10-22 DIAGNOSIS — D649 Anemia, unspecified: Secondary | ICD-10-CM

## 2023-10-22 DIAGNOSIS — I10 Essential (primary) hypertension: Secondary | ICD-10-CM | POA: Diagnosis not present

## 2023-10-22 DIAGNOSIS — Z8261 Family history of arthritis: Secondary | ICD-10-CM

## 2023-10-22 DIAGNOSIS — M2560 Stiffness of unspecified joint, not elsewhere classified: Secondary | ICD-10-CM

## 2023-10-22 LAB — TSH: TSH: 1.4 u[IU]/mL (ref 0.35–5.50)

## 2023-10-22 LAB — POCT GLYCOSYLATED HEMOGLOBIN (HGB A1C): Hemoglobin A1C: 7.8 % — AB (ref 4.0–5.6)

## 2023-10-22 LAB — BASIC METABOLIC PANEL
BUN: 13 mg/dL (ref 6–23)
CO2: 28 meq/L (ref 19–32)
Calcium: 9.6 mg/dL (ref 8.4–10.5)
Chloride: 104 meq/L (ref 96–112)
Creatinine, Ser: 0.83 mg/dL (ref 0.40–1.20)
GFR: 70.33 mL/min (ref >60.00)
Glucose, Bld: 195 mg/dL — ABNORMAL HIGH (ref 70–99)
Potassium: 4.3 meq/L (ref 3.5–5.1)
Sodium: 139 meq/L (ref 135–145)

## 2023-10-22 LAB — C-REACTIVE PROTEIN: CRP: <1 mg/dL (ref 0.5–20.0)

## 2023-10-22 LAB — CK: Total CK: 149 U/L (ref 7–177)

## 2023-10-22 LAB — SEDIMENTATION RATE: Sed Rate: 16 mm/h (ref 0–30)

## 2023-10-22 LAB — VITAMIN D 25 HYDROXY (VIT D DEFICIENCY, FRACTURES): VITD: 36.06 ng/mL (ref 30.00–100.00)

## 2023-10-22 MED ORDER — DAPAGLIFLOZIN PROPANEDIOL 10 MG PO TABS
10.0000 mg | ORAL_TABLET | Freq: Every day | ORAL | 6 refills | Status: DC
Start: 2023-10-22 — End: 2024-02-23

## 2023-10-22 NOTE — Patient Instructions (Addendum)
  Will update labs to help assess the stiffness and joint pains. And we can get a rheumatology opinion also .  Will increase farxiga   10 mg  per day. Will update  iron check .   We can consider going off atorvastatin  for a few weeks and see if  stiffness is better and then rechallenge   Send in a message about gyne and  effect of staying off atorvastatin  for a few weeks .

## 2023-10-26 LAB — SJOGRENS SYNDROME-A EXTRACTABLE NUCLEAR ANTIBODY: SSA (Ro) (ENA) Antibody, IgG: <1 AI

## 2023-10-26 LAB — CYCLIC CITRUL PEPTIDE ANTIBODY, IGG: Cyclic Citrullin Peptide Ab: <16 U

## 2023-10-26 LAB — ANA: Anti Nuclear Antibody (ANA): NEGATIVE

## 2023-10-26 LAB — IRON,TIBC AND FERRITIN PANEL
%SAT: 17 % (ref 16–45)
Ferritin: 7 ng/mL — ABNORMAL LOW (ref 16–288)
Iron: 55 ug/dL (ref 45–160)
TIBC: 319 ug/dL (ref 250–450)

## 2023-10-26 LAB — RHEUMATOID FACTOR: Rheumatoid fact SerPl-aCnc: <10 [IU]/mL (ref <14)

## 2023-10-29 NOTE — Progress Notes (Signed)
So iron level is low but no  anemia  Inflammation markers are  negative  A1c is 7.8  up for you  If symptoms persist off a trial of atorvastatin  then we can do a referral to rheumatology . Also  if there is a family hs of celiac  we can do a blood screen for that disease.  Keep [place] informed

## 2023-10-30 ENCOUNTER — Other Ambulatory Visit: Payer: Self-pay | Admitting: Family

## 2023-10-31 NOTE — Progress Notes (Signed)
I asked about celiac since her iron level was low  but  she is not anemia   sometimes celiac can show as  low iron   since do not  absorb . Sometimes get joint pains .

## 2023-11-04 ENCOUNTER — Other Ambulatory Visit: Payer: Self-pay | Admitting: Internal Medicine

## 2023-11-05 ENCOUNTER — Encounter: Payer: Self-pay | Admitting: Internal Medicine

## 2023-11-11 ENCOUNTER — Other Ambulatory Visit: Payer: Self-pay | Admitting: Internal Medicine

## 2023-11-11 MED ORDER — DULOXETINE HCL 60 MG PO CPEP: 120.0000 mg | ORAL_CAPSULE | Freq: Every day | ORAL | 1 refills | Status: DC

## 2023-11-11 NOTE — Telephone Encounter (Signed)
Copied from CRM [id number]. Topic: Clinical - Medication Refill >> Nov 11, 2023 10:13 AM Truddie Crumble wrote: Most Recent Primary Care Visit:  Provider: Berniece Andreas K  Department: [facility]  Visit Type: OFFICE VISIT  Date: 10/22/2023  Medication: DULoxetine and omeprazole  Has the patient contacted their pharmacy? Yes (Agent: If no, request that the patient contact the pharmacy for the refill. If patient does not wish to contact the pharmacy document the reason why and proceed with request.) (Agent: If yes, when and what did the pharmacy advise?)  Is this the correct pharmacy for this prescription? Yes If no, delete pharmacy and type the correct one.  This is the patient's preferred pharmacy:  [id number] - [place], [place] - [address] AT [address] OF [facility] [address] [place] [address] Phone: [phone] Fax: [phone]   Has the prescription been filled recently? No  Is the patient out of the medication? Yes  Has the patient been seen for an appointment in the last year OR does the patient have an upcoming appointment? Yes  Can we respond through MyChart? Yes  Agent: Please be advised that Rx refills may take up to 3 business days. We ask that you follow-up with your pharmacy.

## 2023-11-16 ENCOUNTER — Other Ambulatory Visit: Payer: Self-pay | Admitting: Orthopaedic Surgery

## 2023-11-16 DIAGNOSIS — M4316 Spondylolisthesis, lumbar region: Secondary | ICD-10-CM

## 2023-11-16 DIAGNOSIS — M545 Low back pain, unspecified: Secondary | ICD-10-CM

## 2023-11-16 DIAGNOSIS — M47896 Other spondylosis, lumbar region: Secondary | ICD-10-CM

## 2023-11-17 ENCOUNTER — Encounter: Payer: Self-pay | Admitting: Orthopaedic Surgery

## 2023-11-23 ENCOUNTER — Other Ambulatory Visit: Payer: 59

## 2023-11-28 ENCOUNTER — Ambulatory Visit
Admission: RE | Admit: 2023-11-28 | Discharge: 2023-11-28 | Disposition: A | Discharge status: Home / Self Care | Payer: 59 | Source: Ambulatory Visit | Attending: Orthopaedic Surgery | Admitting: Orthopaedic Surgery

## 2023-11-28 DIAGNOSIS — M545 Low back pain, unspecified: Secondary | ICD-10-CM

## 2023-11-28 DIAGNOSIS — M47896 Other spondylosis, lumbar region: Secondary | ICD-10-CM

## 2023-11-28 DIAGNOSIS — M4316 Spondylolisthesis, lumbar region: Secondary | ICD-10-CM

## 2023-12-25 ENCOUNTER — Other Ambulatory Visit: Payer: Self-pay | Admitting: Family

## 2024-01-05 ENCOUNTER — Ambulatory Visit

## 2024-01-07 ENCOUNTER — Ambulatory Visit

## 2024-01-11 ENCOUNTER — Ambulatory Visit: Attending: Internal Medicine

## 2024-01-11 ENCOUNTER — Other Ambulatory Visit: Payer: Self-pay

## 2024-01-11 DIAGNOSIS — M5459 Other low back pain: Secondary | ICD-10-CM | POA: Diagnosis present

## 2024-01-11 NOTE — Therapy (Signed)
 OUTPATIENT PHYSICAL THERAPY THORACOLUMBAR EVALUATION   Patient Name: Holly Beasley MRN: [id number] DOB:[birth year], 73 y.o., female Today's Date: 01/11/2024  END OF SESSION:  PT End of Session - 01/11/24 1021     Visit Number 1    Number of Visits 13    Date for PT Re-Evaluation 02/22/24    PT Start Time 1015    PT Stop Time 1100    PT Time Calculation (min) 45 min    Equipment Utilized During Treatment Gait belt    Activity Tolerance Patient tolerated treatment well    Behavior During Therapy WFL for tasks assessed/performed             Past Medical History:  Diagnosis Date   Allergy    seasonal,take shots   Cataract    bilateral,removed 2019   Depression    Diabetes mellitus without complication (HCC)    Dysrhythmia    SVTs   GERD (gastroesophageal reflux disease)    History of ETOH abuse    recovering  in remission   Hyperlipidemia    Hypertension    Osteoarthritis    Osteopenia    dexa -1.7 h -1.35    Scoliosis    mild scoliosis, leg length discrepancy   Skin cancer    basal cell nose   Sleep apnea    did not meet criteria for cpap-has mouthpiece not using per pt   Substance abuse (HCC)    etoh 40 years ago   SVT (supraventricular tachycardia) (HCC) 08/15/2016   Varicose veins    Wears contact lenses    Past Surgical History:  Procedure Laterality Date   ANTERIOR FUSION CERVICAL SPINE     [phone]   BREAST BIOPSY     2003-rt   CATARACT EXTRACTION, BILATERAL  2019   COLONOSCOPY W/ POLYPECTOMY  4782,9562, 11-2022   precancerous   2004   DENTAL SURGERY  2015   bone graft    KNEE ARTHROSCOPY WITH LATERAL MENISECTOMY Left 11/10/2014   Procedure: LEFT KNEE ARTHROSCOPY PARTIAL LATERAL MENISECTOMY CHONDROPLASTY;  Surgeon: Velna Ochs, MD;  Location: [facility];  Service: Orthopedics;  Laterality: Left;   NASAL SINUS SURGERY  11/13/2008   polyps removed   skin cancer removal     basal cell nose 2004   TONSILLECTOMY AND  ADENOIDECTOMY  1960   TOTAL KNEE ARTHROPLASTY Left 02/03/2023   Procedure: LEFT TOTAL KNEE ARTHROPLASTY;  Surgeon: Marcene Corning, MD;  Location: [facility];  Service: Orthopedics;  Laterality: Left;   VENOUS ABLATION Left 2015   Dr Hart Rochester   Patient Active Problem List   Diagnosis Date Noted   Primary osteoarthritis of left knee 02/03/2023   HLD (hyperlipidemia) 04/14/2016   Essential hypertension 12/18/2014   Medication management 12/18/2014   Varicose veins of leg with complications 10/09/2014   Left knee pain 09/21/2014   Tear of meniscus of knee 09/21/2014   Hx of bacterial sinusitis 08/28/2014   Knee pain, acute 06/26/2014   Leg edema, left 06/20/2014   Varicose veins of bilateral lower extremities with other complications 06/20/2014   Varicose veins 05/03/2014   Inguinal adenopathy by ultrasound doppler 05/03/2014   Left leg swelling 04/25/2014   Diabetes mellitus type 2, controlled, without complications (HCC) 04/25/2014   Visit for preventive health examination 06/07/2012   Foot dermatitis 06/07/2012   Shortness of breath 11/06/2011   Nausea 11/06/2011   Hand eczema 06/27/2011   Prediabetes 03/20/2011   Preventative health care 03/20/2011   Dry eyes  Headache 12/30/2010   Scoliosis    Osteopenia    History of ETOH abuse    Sinusitis, chronic 02/27/2010   LIBIDO, DECREASED 02/27/2010   GERD 02/20/2009   CONSTIPATION, CHRONIC 02/20/2009   SKIN CANCER, HX OF 02/20/2009   OSA (obstructive sleep apnea) 03/21/2008   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 Berniece Andreas  REFERRING PROVIDER: Verlin Fester, PA-C  REFERRING DIAG: spondylolisthesis  Rationale for Evaluation and Treatment: Rehabilitation  THERAPY DIAG:  Other low back pain  ONSET DATE: 01/01/2024- date of referral  SUBJECTIVE:                                                                                                                                                                                           SUBJECTIVE STATEMENT: Pt reports of chronic lower back pain. Pt reports pain travels down the left leg which makes her buckle her legs. It happens in L>R leg and occurs 3-5 times a day. Back pain has been going on since 2004. Recent MRI showed spinal stenosis. Recently had cervical ACDF C4-6 2023. Pt also reports of intermittent numbness in her bil toes (2nd and 3rd toes on R foot). Pt sees personal trainer M, W, F.  PERTINENT HISTORY:  L TKA 02/03/23. PMH: SVT, ACDF,DM  PAIN:  Are you having pain? Yes: NPRS scale: 9-10/10 Pain location: back pain travels down to  Pain description: sharp, radiating Aggravating factors: Standing for prolonged periods of time; walking, getting up after sitting for a long time. Relieving factors: sitting, lying  PRECAUTIONS: None  RED FLAGS: None   WEIGHT BEARING RESTRICTIONS: No  FALLS:  Has patient fallen in last 6 months? No  PLOF: Independent  PATIENT GOALS: manage back pain    OBJECTIVE:  Note: Objective measures were completed at Evaluation unless otherwise noted.  DIAGNOSTIC FINDINGS:  11/28/23 MRI of lumbar spine IMPRESSION: 1. Severe L4-5 facet arthrosis with grade 1 anterolisthesis, severe spinal stenosis, and moderate bilateral neural foraminal stenosis. 2. Mild spinal stenosis and moderate left neural foraminal stenosis at L3-4.  PATIENT SURVEYS:  Modified Oswestry 14/50 = 28% disability  COGNITION: Overall cognitive status: Within functional limits for tasks assessed       LUMBAR ROM:   AROM eval  Flexion   Extension   Right lateral flexion   Left lateral flexion   Right rotation   Left rotation    (Blank rows = not tested)  LOWER EXTREMITY ROM:     Active  Right eval Left eval  Hip flexion    Hip extension    Hip abduction    Hip adduction    Hip internal rotation    Hip external rotation    Knee flexion    Knee extension    Ankle dorsiflexion    Ankle plantarflexion    Ankle inversion    Ankle eversion     (Blank rows = not tested)  LOWER EXTREMITY MMT:    MMT Right eval Left eval  Hip flexion 5 5  Hip extension    Hip abduction 5 5  Hip adduction 5 5  Hip internal rotation    Hip external rotation    Knee flexion 5 5  Knee extension 5 5  Ankle dorsiflexion 5 5  Ankle plantarflexion    Ankle inversion    Ankle eversion     (Blank rows = not tested)  FUNCTIONAL TESTS:  5 times sit to stand: 28 sec without UE support  TREATMENT DATE:  01/11/24                                                                                                                               Patient education: Reviewed evaluation findings with patient, discussed POC with patient. Pt educated on importance of self management by taking breaks with cleaning, cooking etc. Pt educated on sitting down after 1 hour of activity  Pt educated on breaking groceries down into smaller manageable loads and bringing most critical items in the house first and going back for other items later in the day or next day. Pt educated on breaking her shopping days into multiple days to avoid prolonged standing/walking. Goals discussed with patient. Sit to stand: 5x, cues for nose over toes, educated pt to have the chair against wall or furniture to avoid chair from sliding away.  PATIENT EDUCATION:  Education details: see above Person educated: Patient Education method:  Explanation Education comprehension: verbalized understanding  HOME EXERCISE PROGRAM: Access Code: [email or web address] URL: [email or web address] Date: 01/11/2024 Prepared by: Lavone Nian  Exercises - Sit to Stand  - 1 x daily - 7 x weekly - 10 reps  ASSESSMENT:  CLINICAL IMPRESSION: Patient is a 73 y.o. female who was seen today for physical therapy evaluation and treatment for lower back pain. Objective impairments include decreased functional strength in bil LE, decreased pain management, soft tissue restrictions, and pain. These impairments are limiting patient from performing prolonged sitting, standing, and walking which interfers with her daily function. Patient will benefit from skilled PT.   OBJECTIVE IMPAIRMENTS: decreased activity tolerance, decreased endurance,  decreased mobility, difficulty walking, decreased strength, hypomobility, increased muscle spasms, impaired flexibility, postural dysfunction, and pain.   ACTIVITY LIMITATIONS: carrying, lifting, bending, standing, squatting, and sleeping  PARTICIPATION LIMITATIONS: meal prep, cleaning, laundry, shopping, and community activity  PERSONAL FACTORS: Time since onset of injury/illness/exacerbation and 1-2 comorbidities: C4-6 ACDF, L TKR 4/24  are also affecting patient's functional outcome.   REHAB POTENTIAL: Good  CLINICAL DECISION MAKING: Stable/uncomplicated  EVALUATION COMPLEXITY: Low   GOALS: Goals reviewed with patient? Yes  SHORT TERM GOALS: Target date: 02/04/2024  Pt will be 50% compliant with HEP to self manage her symptoms.  Baseline: Goal status: INITIAL   LONG TERM GOALS: Target date: 03/03/2024   Patient will demo 15 points improvement on Modified Oswestery test to improve overall function.  Baseline: 28% (01/11/24) Goal status: INITIAL  2.  Pt will be able to perform 5x/ sit to stand in <18 seconds to improve functional strength.  Baseline: 28 sec (01/11/24) Goal status: INITIAL  3.   Patient will report pain at worst of >6/10 at to improve self management of pain. Baseline: 9/10 (01/11/24) Goal status: INITIAL  4.  Pt will be 100% compliant with HEP to self manage her symptoms to improve self management.  Baseline: Issued today. Goal status: INITIAL   PLAN:  PT FREQUENCY: 2x/week  PT DURATION: 8 weeks  PLANNED INTERVENTIONS: 97164- PT Re-evaluation, 97110-Therapeutic exercises, 97530- Therapeutic activity, 97112- Neuromuscular re-education, 97535- Self Care, 16109- Manual therapy, [phone]- Gait training, [phone]- Electrical stimulation (unattended), Patient/Family education, Balance training, Stair training, Dry Needling, Joint manipulation, Spinal mobilization, Cryotherapy, and Moist heat.  PLAN FOR NEXT SESSION: Review ODI score, review HEP, practice steps, manual therapy to back, assess lumbar AROM   Ileana Ladd, PT 01/11/2024, 10:22 AM

## 2024-01-19 ENCOUNTER — Ambulatory Visit: Attending: Internal Medicine

## 2024-01-19 DIAGNOSIS — M5459 Other low back pain: Secondary | ICD-10-CM | POA: Insufficient documentation

## 2024-01-19 NOTE — Therapy (Signed)
 OUTPATIENT PHYSICAL THERAPY THORACOLUMBAR TREATMENT NOTE   Patient Name: Holly Beasley MRN: [id number] DOB:[birth year], 73 y.o., female Today's Date: 01/19/2024  END OF SESSION:  PT End of Session - 01/19/24 1017     Visit Number 2    Number of Visits 13    Date for PT Re-Evaluation 02/22/24    PT Start Time 1015    PT Stop Time 1100    PT Time Calculation (min) 45 min    Equipment Utilized During Treatment Gait belt    Activity Tolerance Patient tolerated treatment well    Behavior During Therapy WFL for tasks assessed/performed             Past Medical History:  Diagnosis Date   Allergy    seasonal,take shots   Cataract    bilateral,removed 2019   Depression    Diabetes mellitus without complication (HCC)    Dysrhythmia    SVTs   GERD (gastroesophageal reflux disease)    History of ETOH abuse    recovering  in remission   Hyperlipidemia    Hypertension    Osteoarthritis    Osteopenia    dexa -1.7 h -1.35    Scoliosis    mild scoliosis, leg length discrepancy   Skin cancer    basal cell nose   Sleep apnea    did not meet criteria for cpap-has mouthpiece not using per pt   Substance abuse (HCC)    etoh 40 years ago   SVT (supraventricular tachycardia) (HCC) 08/15/2016   Varicose veins    Wears contact lenses    Past Surgical History:  Procedure Laterality Date   ANTERIOR FUSION CERVICAL SPINE     [phone]   BREAST BIOPSY     2003-rt   CATARACT EXTRACTION, BILATERAL  2019   COLONOSCOPY W/ POLYPECTOMY  [id number], 11-2022   precancerous   2004   DENTAL SURGERY  2015   bone graft    KNEE ARTHROSCOPY WITH LATERAL MENISECTOMY Left 11/10/2014   Procedure: LEFT KNEE ARTHROSCOPY PARTIAL LATERAL MENISECTOMY CHONDROPLASTY;  Surgeon: Velna Ochs, MD;  Location: [facility];  Service: Orthopedics;  Laterality: Left;   NASAL SINUS SURGERY  11/13/2008   polyps removed   skin cancer removal     basal cell nose 2004   TONSILLECTOMY AND  ADENOIDECTOMY  1960   TOTAL KNEE ARTHROPLASTY Left 02/03/2023   Procedure: LEFT TOTAL KNEE ARTHROPLASTY;  Surgeon: Marcene Corning, MD;  Location: [facility];  Service: Orthopedics;  Laterality: Left;   VENOUS ABLATION Left 2015   Dr Hart Rochester   Patient Active Problem List   Diagnosis Date Noted   Primary osteoarthritis of left knee 02/03/2023   HLD (hyperlipidemia) 04/14/2016   Essential hypertension 12/18/2014   Medication management 12/18/2014   Varicose veins of leg with complications 10/09/2014   Left knee pain 09/21/2014   Tear of meniscus of knee 09/21/2014   Hx of bacterial sinusitis 08/28/2014   Knee pain, acute 06/26/2014   Leg edema, left 06/20/2014   Varicose veins of bilateral lower extremities with other complications 06/20/2014   Varicose veins 05/03/2014   Inguinal adenopathy by ultrasound doppler 05/03/2014   Left leg swelling 04/25/2014   Diabetes mellitus type 2, controlled, without complications (HCC) 04/25/2014   Visit for preventive health examination 06/07/2012   Foot dermatitis 06/07/2012   Shortness of breath 11/06/2011   Nausea 11/06/2011   Hand eczema 06/27/2011   Prediabetes 03/20/2011   Preventative health care 03/20/2011   Dry  eyes    Headache 12/30/2010   Scoliosis    Osteopenia    History of ETOH abuse    Sinusitis, chronic 02/27/2010   LIBIDO, DECREASED 02/27/2010   GERD 02/20/2009   CONSTIPATION, CHRONIC 02/20/2009   SKIN CANCER, HX OF 02/20/2009   OSA (obstructive sleep apnea) 03/21/2008   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 Berniece Andreas  REFERRING PROVIDER: Verlin Fester, PA-C  REFERRING DIAG: spondylolisthesis  Rationale for Evaluation and Treatment: Rehabilitation  THERAPY DIAG:  Other low back pain  ONSET DATE: 01/01/2024- date of referral  SUBJECTIVE:                                                                                                                                                                                           SUBJECTIVE STATEMENT: Pt reports pain going down L leg currently. It is 6-7/10.  PERTINENT HISTORY:  L TKA 02/03/23. PMH: SVT, ACDF,DM  PAIN:  Are you having pain? Yes: NPRS scale: 6-7/10 Pain location: back pain travels down to  Pain description: sharp, radiating Aggravating factors: Standing for prolonged periods of time; walking, getting up after sitting for a long time. Relieving factors: sitting, lying  PRECAUTIONS: None  RED FLAGS: None   WEIGHT BEARING RESTRICTIONS: No  FALLS:  Has patient fallen in last 6 months? No  PLOF: Independent  PATIENT GOALS: manage back pain    OBJECTIVE:  Note: Objective measures were completed at Evaluation unless otherwise noted.  DIAGNOSTIC FINDINGS:  11/28/23 MRI of lumbar spine IMPRESSION: 1. Severe L4-5 facet arthrosis with grade 1 anterolisthesis, severe spinal stenosis, and moderate bilateral neural foraminal stenosis. 2. Mild spinal stenosis and moderate left neural foraminal stenosis at L3-4.  PATIENT SURVEYS:  Modified Oswestry 14/50 = 28% disability   COGNITION: Overall cognitive status: Within functional limits for tasks assessed       LUMBAR ROM:   AROM eval  Flexion   Extension   Right lateral flexion   Left lateral flexion   Right rotation   Left rotation    (Blank rows = not tested)  LOWER EXTREMITY ROM:  Active  Right eval Left eval  Hip flexion    Hip  extension    Hip abduction    Hip adduction    Hip internal rotation    Hip external rotation    Knee flexion    Knee extension    Ankle dorsiflexion    Ankle plantarflexion    Ankle inversion    Ankle eversion     (Blank rows = not tested)  LOWER EXTREMITY MMT:    MMT Right eval Left eval  Hip flexion 5 5  Hip extension    Hip abduction 5 5  Hip adduction 5 5  Hip internal rotation    Hip external rotation    Knee flexion 5 5  Knee extension 5 5  Ankle dorsiflexion 5 5  Ankle plantarflexion    Ankle inversion    Ankle eversion     (Blank rows = not tested)  FUNCTIONAL TESTS:  5 times sit to stand: 28 sec without UE support  TREATMENT DATE:  01/19/24 Positive SLR in L LE Attempted manual piriformis stretch on L but pt was complaining of L hip impingement anterior Supine unilateral hip ER stretch: 3 x 30" R and L Supine hooklying marching: slow and controlled: 2 x 10 R and L Active release with trigger point hold on piriformis as patient performed clamshells: 3 x 10 L only Supine heel slides: 2 x 10 Supine SLR: 2 x 10 R and L Pt educated on proper log roll to go from supine to sit to avoid strain on lumbar spine. PATIENT EDUCATION:  Education details: see above Person educated: Patient Education method: Explanation Education comprehension: verbalized understanding  HOME EXERCISE PROGRAM: Access Code: [email or web address] URL: [email or web address] Date: 01/19/2024 Prepared by: Lavone Nian  Exercises - Sit to Stand  - 1 x daily - 7 x weekly - 10 reps - Supine Hip External Rotation  - 2 x daily - 7 x weekly - 2-3 reps - 30 sec hold - Supine March  - 2 x daily - 7 x weekly - 2 sets - 10 reps - Supine Heel Slide  - 1 x daily - 7 x weekly - 2 sets - 10 reps - Straight Leg Raise  - 1 x daily - 7 x weekly - 2 sets - 10 reps ASSESSMENT:  CLINICAL IMPRESSION: Today's session was focused on establishing HEP for general flexibility and strengthening for  hip/core to manage lower back pain.    OBJECTIVE IMPAIRMENTS: decreased activity tolerance, decreased endurance, decreased mobility, difficulty walking, decreased strength, hypomobility, increased muscle spasms, impaired flexibility, postural dysfunction, and pain.   ACTIVITY LIMITATIONS: carrying, lifting, bending, standing, squatting, and sleeping  PARTICIPATION LIMITATIONS: meal prep, cleaning, laundry, shopping, and community activity  PERSONAL FACTORS: Time since onset of injury/illness/exacerbation and 1-2 comorbidities: C4-6 ACDF, L TKR 4/24  are also affecting patient's functional outcome.   REHAB POTENTIAL: Good  CLINICAL DECISION MAKING: Stable/uncomplicated  EVALUATION COMPLEXITY: Low   GOALS: Goals reviewed with patient? Yes  SHORT TERM GOALS: Target date: 02/04/2024  Pt will be 50% compliant with HEP to self manage her symptoms.  Baseline: Goal status: INITIAL   LONG TERM GOALS: Target date: 03/03/2024   Patient will demo 15 points improvement on Modified Oswestery test to improve overall function.  Baseline: 28% (01/11/24) Goal status: INITIAL  2.  Pt will be able to perform 5x/ sit to stand in <18 seconds to improve functional strength.  Baseline: 28 sec (01/11/24) Goal status: INITIAL  3.  Patient  will report pain at worst of >6/10 at to improve self management of pain. Baseline: 9/10 (01/11/24) Goal status: INITIAL  4.  Pt will be 100% compliant with HEP to self manage her symptoms to improve self management.  Baseline: Issued today. Goal status: INITIAL   PLAN:  PT FREQUENCY: 2x/week  PT DURATION: 8 weeks  PLANNED INTERVENTIONS: 97164- PT Re-evaluation, 97110-Therapeutic exercises, 97530- Therapeutic activity, 97112- Neuromuscular re-education, 97535- Self Care, 54098- Manual therapy, [phone]- Gait training, [phone]- Electrical stimulation (unattended), Patient/Family education, Balance training, Stair training, Dry Needling, Joint manipulation, Spinal  mobilization, Cryotherapy, and Moist heat.  PLAN FOR NEXT SESSION: Review ODI score, review HEP, practice steps, manual therapy to back, assess lumbar AROM   Ileana Ladd, PT 01/19/2024, 10:17 AM

## 2024-01-21 ENCOUNTER — Ambulatory Visit

## 2024-01-21 DIAGNOSIS — M5459 Other low back pain: Secondary | ICD-10-CM

## 2024-01-21 NOTE — Therapy (Signed)
 OUTPATIENT PHYSICAL THERAPY THORACOLUMBAR TREATMENT NOTE   Patient Name: Holly Beasley MRN: [id number] DOB:March 24, 1951, 73 y.o., female Today's Date: 01/21/2024  END OF SESSION:  PT End of Session - 01/21/24 0941     Visit Number 3    Number of Visits 13    Date for PT Re-Evaluation 02/22/24    PT Start Time 0930    PT Stop Time 1015    PT Time Calculation (min) 45 min    Equipment Utilized During Treatment Gait belt    Activity Tolerance Patient tolerated treatment well    Behavior During Therapy WFL for tasks assessed/performed             Past Medical History:  Diagnosis Date   Allergy    seasonal,take shots   Cataract    bilateral,removed 2019   Depression    Diabetes mellitus without complication (HCC)    Dysrhythmia    SVTs   GERD (gastroesophageal reflux disease)    History of ETOH abuse    recovering  in remission   Hyperlipidemia    Hypertension    Osteoarthritis    Osteopenia    dexa -1.7 h -1.35    Scoliosis    mild scoliosis, leg length discrepancy   Skin cancer    basal cell nose   Sleep apnea    did not meet criteria for cpap-has mouthpiece not using per pt   Substance abuse (HCC)    etoh 40 years ago   SVT (supraventricular tachycardia) (HCC) 08/15/2016   Varicose veins    Wears contact lenses    Past Surgical History:  Procedure Laterality Date   ANTERIOR FUSION CERVICAL SPINE     [phone]   BREAST BIOPSY     2003-rt   CATARACT EXTRACTION, BILATERAL  2019   COLONOSCOPY W/ POLYPECTOMY  5621,3086, 11-2022   precancerous   2004   DENTAL SURGERY  2015   bone graft    KNEE ARTHROSCOPY WITH LATERAL MENISECTOMY Left 11/10/2014   Procedure: LEFT KNEE ARTHROSCOPY PARTIAL LATERAL MENISECTOMY CHONDROPLASTY;  Surgeon: Velna Ochs, MD;  Location: [facility];  Service: Orthopedics;  Laterality: Left;   NASAL SINUS SURGERY  11/13/2008   polyps removed   skin cancer removal     basal cell nose 2004   TONSILLECTOMY AND  ADENOIDECTOMY  1960   TOTAL KNEE ARTHROPLASTY Left 02/03/2023   Procedure: LEFT TOTAL KNEE ARTHROPLASTY;  Surgeon: Marcene Corning, MD;  Location: [facility];  Service: Orthopedics;  Laterality: Left;   VENOUS ABLATION Left 2015   Dr Hart Rochester   Patient Active Problem List   Diagnosis Date Noted   Primary osteoarthritis of left knee 02/03/2023   HLD (hyperlipidemia) 04/14/2016   Essential hypertension 12/18/2014   Medication management 12/18/2014   Varicose veins of leg with complications 10/09/2014   Left knee pain 09/21/2014   Tear of meniscus of knee 09/21/2014   Hx of bacterial sinusitis 08/28/2014   Knee pain, acute 06/26/2014   Leg edema, left 06/20/2014   Varicose veins of bilateral lower extremities with other complications 06/20/2014   Varicose veins 05/03/2014   Inguinal adenopathy by ultrasound doppler 05/03/2014   Left leg swelling 04/25/2014   Diabetes mellitus type 2, controlled, without complications (HCC) 04/25/2014   Visit for preventive health examination 06/07/2012   Foot dermatitis 06/07/2012   Shortness of breath 11/06/2011   Nausea 11/06/2011   Hand eczema 06/27/2011   Prediabetes 03/20/2011   Preventative health care 03/20/2011   Dry  eyes    Headache 12/30/2010   Scoliosis    Osteopenia    History of ETOH abuse    Sinusitis, chronic 02/27/2010   LIBIDO, DECREASED 02/27/2010   GERD 02/20/2009   CONSTIPATION, CHRONIC 02/20/2009   SKIN CANCER, HX OF 02/20/2009   OSA (obstructive sleep apnea) 03/21/2008   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 Berniece Andreas  REFERRING PROVIDER: Verlin Fester, PA-C  REFERRING DIAG: spondylolisthesis  Rationale for Evaluation and Treatment: Rehabilitation  THERAPY DIAG:  Other low back pain  ONSET DATE: 01/01/2024- date of referral  SUBJECTIVE:                                                                                                                                                                                           SUBJECTIVE STATEMENT: Pt reports pain going down L leg currently. It is 4-5/10. It always worst in the morning. She mostly sleeps on her left side but does go to her right side.   PERTINENT HISTORY:  L TKA 02/03/23. PMH: SVT, ACDF,DM  PAIN:  Are you having pain? Yes: NPRS scale: 4-5/10 Pain location: back pain travels down to  Pain description: sharp, radiating Aggravating factors: Standing for prolonged periods of time; walking, getting up after sitting for a long time. Relieving factors: sitting, lying  PRECAUTIONS: None  RED FLAGS: None   WEIGHT BEARING RESTRICTIONS: No  FALLS:  Has patient fallen in last 6 months? No  PLOF: Independent  PATIENT GOALS: manage back pain    OBJECTIVE:  Note: Objective measures were completed at Evaluation unless otherwise noted.  DIAGNOSTIC FINDINGS:  11/28/23 MRI of lumbar spine IMPRESSION: 1. Severe L4-5 facet arthrosis with grade 1 anterolisthesis, severe spinal stenosis, and moderate bilateral neural foraminal stenosis. 2. Mild spinal stenosis and moderate left neural foraminal stenosis at L3-4.  PATIENT SURVEYS:  Modified Oswestry 14/50 = 28% disability   COGNITION: Overall cognitive status: Within functional limits for tasks assessed       LUMBAR ROM:   AROM eval  Flexion   Extension   Right lateral flexion   Left lateral flexion   Right  rotation   Left rotation    (Blank rows =  not tested)  LOWER EXTREMITY ROM:     Active  Right eval Left eval  Hip flexion    Hip extension    Hip abduction    Hip adduction    Hip internal rotation    Hip external rotation    Knee flexion    Knee extension    Ankle dorsiflexion    Ankle plantarflexion    Ankle inversion    Ankle eversion     (Blank rows = not tested)  LOWER EXTREMITY MMT:    MMT Right eval Left eval  Hip flexion 5 5  Hip extension    Hip abduction 5 5  Hip adduction 5 5  Hip internal rotation    Hip external rotation    Knee flexion 5 5  Knee extension 5 5  Ankle dorsiflexion 5 5  Ankle plantarflexion    Ankle inversion    Ankle eversion     (Blank rows = not tested)  FUNCTIONAL TESTS:  5 times sit to stand: 28 sec without UE support  TREATMENT DATE:  01/21/24 Soft tissue work to L hip and L lumbar spine, no significant tenderness in lumbar spine but tender points over L piriformis. Positive SLR in L LE Passive neural flossing in L LE at hip and knee 20x each joint in supine Ambulated: 1 x 390 feet with cane, pt reported no increase in her symptoms at end of the session Seated slump flossing: 15x L only Ambulated 1 x 230', no increase in her symptoms. Pt reported centralization of symptoms where she wasn't feeling that down her posterior L thigh, only in L gluts Supine hooklying marching: 2 x 10 Supine unilateral hip ER stretch: 3 x 30" R and L Supine SLR: 2 x 10 R and L PATIENT EDUCATION:  Education details: see above Person educated: Patient Education method: Explanation Education comprehension: verbalized understanding  HOME EXERCISE PROGRAM: Access Code: [email or web address] URL: [email or web address] Date: 01/21/2024 Prepared by: Lavone Nian  Exercises - Sit to Stand  - 1 x daily - 7 x weekly - 10 reps - Supine Hip External Rotation  - 2 x daily - 7 x weekly - 2-3 reps - 30 sec hold - Supine March  - 2 x daily - 7 x weekly - 2 sets - 10 reps - Supine Heel Slide  -  1 x daily - 7 x weekly - 2 sets - 10 reps - Straight Leg Raise  - 1 x daily - 7 x weekly - 2 sets - 10 reps - Seated Slump Nerve Glide  - 3 x daily - 7 x weekly - 15 reps ASSESSMENT:  CLINICAL IMPRESSION: Pt reported centralization of her symptoms with neural flossing today. Neural flossing was added to her HEP to perform 3x/day to see if she can manage her radicular symptoms better. Improved hip ER in L LE but seemed tighter in R hip today compared to last session.  OBJECTIVE IMPAIRMENTS: decreased activity tolerance, decreased endurance, decreased mobility, difficulty walking, decreased strength, hypomobility, increased muscle spasms, impaired flexibility, postural dysfunction, and pain.   ACTIVITY LIMITATIONS: carrying, lifting, bending, standing, squatting, and sleeping  PARTICIPATION LIMITATIONS: meal prep, cleaning, laundry, shopping, and community activity  PERSONAL FACTORS: Time since onset of injury/illness/exacerbation and 1-2 comorbidities: C4-6 ACDF, L TKR 4/24  are also affecting patient's functional outcome.   REHAB POTENTIAL: Good  CLINICAL DECISION MAKING: Stable/uncomplicated  EVALUATION COMPLEXITY: Low   GOALS: Goals  reviewed with patient? Yes  SHORT TERM GOALS: Target date: 02/04/2024  Pt will be 50% compliant with HEP to self manage her symptoms.  Baseline: Goal status: INITIAL   LONG TERM GOALS: Target date: 03/03/2024   Patient will demo 15 points improvement on Modified Oswestery test to improve overall function.  Baseline: 28% (01/11/24) Goal status: INITIAL  2.  Pt will be able to perform 5x/ sit to stand in <18 seconds to improve functional strength.  Baseline: 28 sec (01/11/24) Goal status: INITIAL  3.  Patient will report pain at worst of >6/10 at to improve self management of pain. Baseline: 9/10 (01/11/24) Goal status: INITIAL  4.  Pt will be 100% compliant with HEP to self manage her symptoms to improve self management.  Baseline: Issued  today. Goal status: INITIAL   PLAN:  PT FREQUENCY: 2x/week  PT DURATION: 8 weeks  PLANNED INTERVENTIONS: 97164- PT Re-evaluation, 97110-Therapeutic exercises, 97530- Therapeutic activity, 97112- Neuromuscular re-education, 97535- Self Care, 81191- Manual therapy, [phone]- Gait training, [phone]- Electrical stimulation (unattended), Patient/Family education, Balance training, Stair training, Dry Needling, Joint manipulation, Spinal mobilization, Cryotherapy, and Moist heat.  PLAN FOR NEXT SESSION: Review ODI score, review HEP, practice steps, manual therapy to back, assess lumbar AROM   Ileana Ladd, PT 01/21/2024, 9:41 AM

## 2024-01-26 ENCOUNTER — Ambulatory Visit

## 2024-01-28 ENCOUNTER — Ambulatory Visit

## 2024-01-28 ENCOUNTER — Other Ambulatory Visit: Payer: Self-pay | Admitting: Internal Medicine

## 2024-01-28 DIAGNOSIS — M5459 Other low back pain: Secondary | ICD-10-CM

## 2024-01-28 NOTE — Telephone Encounter (Signed)
 Copied from CRM [id number]. Topic: Clinical - Medication Refill >> Jan 28, 2024  1:23 PM Juluis Ok wrote: Most Recent Primary Care Visit:  Provider: Daphine Eagle K  Department: [facility]  Visit Type: OFFICE VISIT  Date: 10/22/2023  Medication: metFORMIN (GLUCOPHAGE) 500 MG tablet omeprazole (PRILOSEC) 40 MG capsule   Has the patient contacted their pharmacy? Yes (Agent: If no, request that the patient contact the pharmacy for the refill. If patient does not wish to contact the pharmacy document the reason why and proceed with request.) (Agent: If yes, when and what did the pharmacy advise?)  Is this the correct pharmacy for this prescription? Yes If no, delete pharmacy and type the correct one.  This is the patient's preferred pharmacy:  [id number] - [place], [place] - [address] AT [address] OF [facility] [address] [place] [address] Phone: [phone] Fax: [phone]   Has the prescription been filled recently? No  Is the patient out of the medication? Yes  Has the patient been seen for an appointment in the last year OR does the patient have an upcoming appointment? No  Can we respond through MyChart? Yes  Agent: Please be advised that Rx refills may take up to 3 business days. We ask that you follow-up with your pharmacy.

## 2024-01-28 NOTE — Therapy (Signed)
 OUTPATIENT PHYSICAL THERAPY THORACOLUMBAR TREATMENT NOTE   Patient Name: Holly Beasley MRN: [id number] DOB:July 25, 1951, 73 y.o., female Today's Date: 01/28/2024  END OF SESSION:    Past Medical History:  Diagnosis Date   Allergy    seasonal,take shots   Cataract    bilateral,removed 2019   Depression    Diabetes mellitus without complication (HCC)    Dysrhythmia    SVTs   GERD (gastroesophageal reflux disease)    History of ETOH abuse    recovering  in remission   Hyperlipidemia    Hypertension    Osteoarthritis    Osteopenia    dexa -1.7 h -1.35    Scoliosis    mild scoliosis, leg length discrepancy   Skin cancer    basal cell nose   Sleep apnea    did not meet criteria for cpap-has mouthpiece not using per pt   Substance abuse (HCC)    etoh 40 years ago   SVT (supraventricular tachycardia) (HCC) 08/15/2016   Varicose veins    Wears contact lenses    Past Surgical History:  Procedure Laterality Date   ANTERIOR FUSION CERVICAL SPINE     [phone]   BREAST BIOPSY     2003-rt   CATARACT EXTRACTION, BILATERAL  2019   COLONOSCOPY W/ POLYPECTOMY  2004,2010, 11-2022   precancerous   2004   DENTAL SURGERY  2015   bone graft    KNEE ARTHROSCOPY WITH LATERAL MENISECTOMY Left 11/10/2014   Procedure: LEFT KNEE ARTHROSCOPY PARTIAL LATERAL MENISECTOMY CHONDROPLASTY;  Surgeon: Velna Ochs, MD;  Location: [facility];  Service: Orthopedics;  Laterality: Left;   NASAL SINUS SURGERY  11/13/2008   polyps removed   skin cancer removal     basal cell nose 2004   TONSILLECTOMY AND ADENOIDECTOMY  1960   TOTAL KNEE ARTHROPLASTY Left 02/03/2023   Procedure: LEFT TOTAL KNEE ARTHROPLASTY;  Surgeon: Marcene Corning, MD;  Location: [facility];  Service: Orthopedics;  Laterality: Left;   VENOUS ABLATION Left 2015   Dr Hart Rochester   Patient Active Problem List   Diagnosis Date Noted   Primary osteoarthritis of left knee 02/03/2023   HLD (hyperlipidemia) 04/14/2016    Essential hypertension 12/18/2014   Medication management 12/18/2014   Varicose veins of leg with complications 10/09/2014   Left knee pain 09/21/2014   Tear of meniscus of knee 09/21/2014   Hx of bacterial sinusitis 08/28/2014   Knee pain, acute 06/26/2014   Leg edema, left 06/20/2014   Varicose veins of bilateral lower extremities with other complications 06/20/2014   Varicose veins 05/03/2014   Inguinal adenopathy by ultrasound doppler 05/03/2014   Left leg swelling 04/25/2014   Diabetes mellitus type 2, controlled, without complications (HCC) 04/25/2014   Visit for preventive health examination 06/07/2012   Foot dermatitis 06/07/2012   Shortness of breath 11/06/2011   Nausea 11/06/2011   Hand eczema 06/27/2011   Prediabetes 03/20/2011   Preventative health care 03/20/2011   Dry eyes    Headache 12/30/2010   Scoliosis    Osteopenia    History of ETOH abuse    Sinusitis, chronic 02/27/2010   LIBIDO, DECREASED 02/27/2010   GERD 02/20/2009   CONSTIPATION, CHRONIC 02/20/2009   SKIN CANCER, HX OF 02/20/2009   OSA (obstructive sleep apnea) 03/21/2008   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 01/06/2008   Essential hypertension 12/29/2007   EXTERNAL HEMORRHOIDS WITH OTHER COMPLICATION 12/29/2007   KNEE PAIN 12/29/2007   UNEQUAL LEG LENGTH 12/29/2007  Idiopathic scoliosis and kyphoscoliosis 12/29/2007   Hx of adenomatous colonic polyps 10/27/2007   HYPERLIPIDEMIA 06/15/2007   ALCOHOL ABUSE, IN REMISSION 06/15/2007   DEPRESSION 06/15/2007   Allergic rhinitis 06/15/2007   Osteoarthritis of left knee 06/15/2007   Disorder of bone and cartilage 06/15/2007   HYPERGLYCEMIA, FASTING 06/15/2007    PCP: Dr. Berniece Andreas  REFERRING PROVIDER: Verlin Fester, PA-C  REFERRING DIAG: spondylolisthesis  Rationale for Evaluation and Treatment: Rehabilitation  THERAPY DIAG:  Other low back pain  ONSET DATE: 01/01/2024- date of referral  SUBJECTIVE:                                                                                                                                                                                            SUBJECTIVE STATEMENT: Pt reports pain going down L leg currently. It is 4-5/10. It always worst in the morning. She mostly sleeps on her left side but does go to her right side.   PERTINENT HISTORY:  L TKA 02/03/23. PMH: SVT, ACDF,DM  PAIN:  Are you having pain? Yes: NPRS scale: 4-5/10 Pain location: back pain travels down to  Pain description: sharp, radiating Aggravating factors: Standing for prolonged periods of time; walking, getting up after sitting for a long time. Relieving factors: sitting, lying  PRECAUTIONS: None  RED FLAGS: None   WEIGHT BEARING RESTRICTIONS: No  FALLS:  Has patient fallen in last 6 months? No  PLOF: Independent  PATIENT GOALS: manage back pain    OBJECTIVE:  Note: Objective measures were completed at Evaluation unless otherwise noted.  DIAGNOSTIC FINDINGS:  11/28/23 MRI of lumbar spine IMPRESSION: 1. Severe L4-5 facet arthrosis with grade 1 anterolisthesis, severe spinal stenosis, and moderate bilateral neural foraminal stenosis. 2. Mild spinal stenosis and moderate left neural foraminal stenosis at L3-4.  PATIENT SURVEYS:  Modified Oswestry 14/50 = 28% disability   COGNITION: Overall cognitive status: Within functional limits for tasks assessed       LUMBAR ROM:   AROM eval  Flexion   Extension   Right lateral flexion   Left lateral flexion   Right rotation   Left rotation    (Blank rows = not tested)  LOWER EXTREMITY ROM:     Active  Right eval Left eval  Hip flexion    Hip extension    Hip abduction    Hip adduction    Hip internal rotation    Hip external rotation    Knee flexion    Knee extension    Ankle dorsiflexion    Ankle plantarflexion    Ankle inversion    Ankle eversion     (  Blank rows = not tested)  LOWER EXTREMITY MMT:    MMT  Right eval Left eval  Hip flexion 5 5  Hip extension    Hip abduction 5 5  Hip adduction 5 5  Hip internal rotation    Hip external rotation    Knee flexion 5 5  Knee extension 5 5  Ankle dorsiflexion 5 5  Ankle plantarflexion    Ankle inversion    Ankle eversion     (Blank rows = not tested)  FUNCTIONAL TESTS:  5 times sit to stand: 28 sec without UE support  TREATMENT DATE:  01/28/24 Lumbar flexion: standing, pt reports pulling in bil posterior legs, most of the flexion is coming from bil hips/knees and thoracic spine. Pt's lumbar spine posture remained in lordosis with patient in fully flexed position Lumbar extension: 100% limited, pt unable to extemd much, as she reports radicular symptom bil posterior proximal thighs Prone lumbar PA: pt reporting provocation of R radicular symptoms with PA pressure over L5, performed Grade I-II Prone hamstring curls: 2 x 10, pt reporting pain in proximal R thigh only L sidelying passive double knee to chest with trunk lateral flexion: pt reporting radicular symptoms in R posterior proximal thigh with R lateral flexion in lumbar position Supine lower trunk rotations: 10x R and L Supine double leg lift in hooklying: placed 6" box under feet to make it easier for patient: 2 x 10    PATIENT EDUCATION:  Education details: see above Person educated: Patient Education method: Explanation Education comprehension: verbalized understanding  HOME EXERCISE PROGRAM: Access Code: [email or web address] URL: [email or web address] Date: 01/28/2024 Prepared by: Susanna Epley  Exercises - Sit to Stand  - 1 x daily - 7 x weekly - 10 reps - Supine Hip External Rotation  - 2 x daily - 7 x weekly - 2-3 reps - 30 sec hold - Supine March  - 2 x daily - 7 x weekly - 2 sets - 10 reps - Supine Heel Slide  - 1 x daily - 7 x weekly - 2 sets - 10 reps - Straight Leg Raise  - 1 x daily - 7 x weekly - 2 sets - 10 reps - Seated Slump Nerve Glide  - 3 x daily -  7 x weekly - 15 reps - Seated Hamstring Curl with Anchored Resistance  - 1 x daily - 7 x weekly - 2 sets - 10 reps ASSESSMENT:  CLINICAL IMPRESSION: Pt's overall lumbar ROM is significantly limited with significant thoracic kyphosis and reduced lumbar segmental mobility in flexion and extension. We emphasized progressing patient's exercises today.  OBJECTIVE IMPAIRMENTS: decreased activity tolerance, decreased endurance, decreased mobility, difficulty walking, decreased strength, hypomobility, increased muscle spasms, impaired flexibility, postural dysfunction, and pain.   ACTIVITY LIMITATIONS: carrying, lifting, bending, standing, squatting, and sleeping  PARTICIPATION LIMITATIONS: meal prep, cleaning, laundry, shopping, and community activity  PERSONAL FACTORS: Time since onset of injury/illness/exacerbation and 1-2 comorbidities: C4-6 ACDF, L TKR 4/24  are also affecting patient's functional outcome.   REHAB POTENTIAL: Good  CLINICAL DECISION MAKING: Stable/uncomplicated  EVALUATION COMPLEXITY: Low   GOALS: Goals reviewed with patient? Yes  SHORT TERM GOALS: Target date: 02/04/2024  Pt will be 50% compliant with HEP to self manage her symptoms.  Baseline: Goal status: INITIAL   LONG TERM GOALS: Target date: 03/03/2024   Patient will demo 15 points improvement on Modified Oswestery test to improve overall function.  Baseline: 28% (01/11/24) Goal status: INITIAL  2.  Pt will be able  to perform 5x/ sit to stand in <18 seconds to improve functional strength.  Baseline: 28 sec (01/11/24) Goal status: INITIAL  3.  Patient will report pain at worst of >6/10 at to improve self management of pain. Baseline: 9/10 (01/11/24) Goal status: INITIAL  4.  Pt will be 100% compliant with HEP to self manage her symptoms to improve self management.  Baseline: Issued today. Goal status: INITIAL   PLAN:  PT FREQUENCY: 2x/week  PT DURATION: 8 weeks  PLANNED INTERVENTIONS: 97164- PT  Re-evaluation, 97110-Therapeutic exercises, 97530- Therapeutic activity, 97112- Neuromuscular re-education, 97535- Self Care, 16109- Manual therapy, [phone]- Gait training, [phone]- Electrical stimulation (unattended), Patient/Family education, Balance training, Stair training, Dry Needling, Joint manipulation, Spinal mobilization, Cryotherapy, and Moist heat.  PLAN FOR NEXT SESSION: Review ODI score, review HEP, practice steps, manual therapy to back, assess lumbar AROM   Kristine Phalen, PT 01/28/2024, 10:53 AM

## 2024-02-01 MED ORDER — METFORMIN HCL 500 MG PO TABS: ORAL_TABLET | ORAL | 1 refills | Status: DC

## 2024-02-01 MED ORDER — OMEPRAZOLE 40 MG PO CPDR: DELAYED_RELEASE_CAPSULE | ORAL | 0 refills | Status: DC

## 2024-02-02 ENCOUNTER — Ambulatory Visit

## 2024-02-02 DIAGNOSIS — M5459 Other low back pain: Secondary | ICD-10-CM | POA: Diagnosis not present

## 2024-02-02 NOTE — Therapy (Signed)
 OUTPATIENT PHYSICAL THERAPY THORACOLUMBAR TREATMENT NOTE   Patient Name: Holly Beasley MRN: [id number] DOB:1951/08/09, 73 y.o., female Today's Date: 02/02/2024  END OF SESSION:  PT End of Session - 02/02/24 1235     Visit Number 6    Number of Visits 13    Date for PT Re-Evaluation 02/22/24    PT Start Time 1230    PT Stop Time 1315    PT Time Calculation (min) 45 min    Equipment Utilized During Treatment Gait belt    Activity Tolerance Patient tolerated treatment well    Behavior During Therapy WFL for tasks assessed/performed              Past Medical History:  Diagnosis Date   Allergy    seasonal,take shots   Cataract    bilateral,removed 2019   Depression    Diabetes mellitus without complication (HCC)    Dysrhythmia    SVTs   GERD (gastroesophageal reflux disease)    History of ETOH abuse    recovering  in remission   Hyperlipidemia    Hypertension    Osteoarthritis    Osteopenia    dexa -1.7 h -1.35    Scoliosis    mild scoliosis, leg length discrepancy   Skin cancer    basal cell nose   Sleep apnea    did not meet criteria for cpap-has mouthpiece not using per pt   Substance abuse (HCC)    etoh 40 years ago   SVT (supraventricular tachycardia) (HCC) 08/15/2016   Varicose veins    Wears contact lenses    Past Surgical History:  Procedure Laterality Date   ANTERIOR FUSION CERVICAL SPINE     [phone]   BREAST BIOPSY     2003-rt   CATARACT EXTRACTION, BILATERAL  2019   COLONOSCOPY W/ POLYPECTOMY  6213,0865, 11-2022   precancerous   2004   DENTAL SURGERY  2015   bone graft    KNEE ARTHROSCOPY WITH LATERAL MENISECTOMY Left 11/10/2014   Procedure: LEFT KNEE ARTHROSCOPY PARTIAL LATERAL MENISECTOMY CHONDROPLASTY;  Surgeon: Alphonzo Ask, MD;  Location: [facility];  Service: Orthopedics;  Laterality: Left;   NASAL SINUS SURGERY  11/13/2008   polyps removed   skin cancer removal     basal cell nose 2004   TONSILLECTOMY AND  ADENOIDECTOMY  1960   TOTAL KNEE ARTHROPLASTY Left 02/03/2023   Procedure: LEFT TOTAL KNEE ARTHROPLASTY;  Surgeon: Dayne Even, MD;  Location: [facility];  Service: Orthopedics;  Laterality: Left;   VENOUS ABLATION Left 2015   Dr Timm Foot   Patient Active Problem List   Diagnosis Date Noted   Primary osteoarthritis of left knee 02/03/2023   HLD (hyperlipidemia) 04/14/2016   Essential hypertension 12/18/2014   Medication management 12/18/2014   Varicose veins of leg with complications 10/09/2014   Left knee pain 09/21/2014   Tear of meniscus of knee 09/21/2014   Hx of bacterial sinusitis 08/28/2014   Knee pain, acute 06/26/2014   Leg edema, left 06/20/2014   Varicose veins of bilateral lower extremities with other complications 06/20/2014   Varicose veins 05/03/2014   Inguinal adenopathy by ultrasound doppler 05/03/2014   Left leg swelling 04/25/2014   Diabetes mellitus type 2, controlled, without complications (HCC) 04/25/2014   Visit for preventive health examination 06/07/2012   Foot dermatitis 06/07/2012   Shortness of breath 11/06/2011   Nausea 11/06/2011   Hand eczema 06/27/2011   Prediabetes 03/20/2011   Preventative health care 03/20/2011  Dry eyes    Headache 12/30/2010   Scoliosis    Osteopenia    History of ETOH abuse    Sinusitis, chronic 02/27/2010   LIBIDO, DECREASED 02/27/2010   GERD 02/20/2009   CONSTIPATION, CHRONIC 02/20/2009   SKIN CANCER, HX OF 02/20/2009   OSA (obstructive sleep apnea) 03/21/2008   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 Daphine Eagle  REFERRING PROVIDER: Vickii Grand, PA-C  REFERRING DIAG: spondylolisthesis  Rationale for Evaluation and Treatment: Rehabilitation  THERAPY DIAG:  Other low back pain  ONSET DATE: 01/01/2024- date of referral  SUBJECTIVE:                                                                                                                                                                                           SUBJECTIVE STATEMENT: Pt reports last night she had rehersal for 2 hours and she didn't get up and walk around to take break. She was stiff last night. Pain is 5/10. Overall she is not experieincing pain down her left leg as much as before but it located in L hip.  PERTINENT HISTORY:  L TKA 02/03/23. PMH: SVT, ACDF,DM  PAIN:  Are you having pain? Yes: NPRS scale: 4-5/10 Pain location: back pain travels down to  Pain description: sharp, radiating Aggravating factors: Standing for prolonged periods of time; walking, getting up after sitting for a long time. Relieving factors: sitting, lying  PRECAUTIONS: None  RED FLAGS: None   WEIGHT BEARING RESTRICTIONS: No  FALLS:  Has patient fallen in last 6 months? No  PLOF: Independent  PATIENT GOALS: manage back pain    OBJECTIVE:  Note: Objective measures were completed at Evaluation unless otherwise noted.  DIAGNOSTIC FINDINGS:  11/28/23 MRI of lumbar spine IMPRESSION: 1. Severe L4-5 facet arthrosis with grade 1 anterolisthesis, severe spinal stenosis, and moderate bilateral neural foraminal stenosis. 2. Mild spinal stenosis and moderate left neural foraminal stenosis at L3-4.  PATIENT SURVEYS:  Modified Oswestry 14/50 = 28% disability   COGNITION: Overall cognitive status: Within functional limits for tasks assessed       LUMBAR ROM:   AROM eval  Flexion   Extension   Right lateral  flexion   Left lateral flexion   Right rotation   Left rotation    (Blank rows = not tested)  LOWER EXTREMITY ROM:     Active  Right eval Left eval  Hip flexion    Hip extension    Hip abduction    Hip adduction    Hip internal rotation    Hip external rotation    Knee flexion    Knee extension    Ankle dorsiflexion    Ankle plantarflexion    Ankle inversion    Ankle eversion     (Blank rows = not tested)  LOWER EXTREMITY MMT:    MMT Right eval Left eval  Hip flexion 5 5  Hip extension    Hip abduction 5 5  Hip adduction 5 5  Hip internal rotation    Hip external rotation    Knee flexion 5 5  Knee extension 5 5  Ankle dorsiflexion 5 5  Ankle plantarflexion    Ankle inversion    Ankle eversion     (Blank rows = not tested)  FUNCTIONAL TESTS:  5 times sit to stand: 28 sec without UE support  TREATMENT DATE:  02/02/24 Lumbar extension: 10x with back of knees against mat table; 10x with hips against countertop with cues for long axis traction with UE as she goes into extension.  Seated lumbar flexion: 10x at EOB, cues for posterior pelvis tilt to isolate lumbar spine  Neural flossing: slump position: 10x R and L  Pt reported 0/10 pain at this point.  Supine lumbar rotations: 10x R and L Supine hooklying alternating marching: 10x R and L Supine double leg lift in hooklying: placed 4" box under feet to make it easier for patient: 2 x 10  Sidelying passive and AA lumbar rotations and unilateral hip flexion/lumbar flexion with single knee to stretch: performed bil  Pt educated on taking a break after 1 hour of rehearsals to perform seated lumbar flexion stretching and/or standing lumbar extension along with little bit of walking to stretch her back.  Pt educated on modifying activities with vacuuming or cleaning.  PATIENT EDUCATION:  Education details: see above Person educated: Patient Education method: Explanation Education comprehension: verbalized  understanding  HOME EXERCISE PROGRAM: Access Code: [email or web address] URL: [email or web address] Date: 01/28/2024 Prepared by: Susanna Epley  Exercises - Sit to Stand  - 1 x daily - 7 x weekly - 10 reps - Supine Hip External Rotation  - 2 x daily - 7 x weekly - 2-3 reps - 30 sec hold - Supine March  - 2 x daily - 7 x weekly - 2 sets - 10 reps - Supine Heel Slide  - 1 x daily - 7 x weekly - 2 sets - 10 reps - Straight Leg Raise  - 1 x daily - 7 x weekly - 2 sets - 10 reps - Seated Slump Nerve Glide  - 3 x daily - 7 x weekly - 15 reps - Seated Hamstring Curl with Anchored Resistance  - 1 x daily - 7 x weekly - 2 sets - 10 reps ASSESSMENT:  CLINICAL IMPRESSION: Pt is reporting centralization of her symptoms. Pt does require further education on self management of her symptoms and activity modifications to manage overall pain.  OBJECTIVE IMPAIRMENTS: decreased activity tolerance, decreased endurance, decreased mobility, difficulty walking, decreased strength, hypomobility, increased muscle spasms, impaired flexibility, postural dysfunction, and pain.   ACTIVITY LIMITATIONS: carrying, lifting, bending,  standing, squatting, and sleeping  PARTICIPATION LIMITATIONS: meal prep, cleaning, laundry, shopping, and community activity  PERSONAL FACTORS: Time since onset of injury/illness/exacerbation and 1-2 comorbidities: C4-6 ACDF, L TKR 4/24  are also affecting patient's functional outcome.   REHAB POTENTIAL: Good  CLINICAL DECISION MAKING: Stable/uncomplicated  EVALUATION COMPLEXITY: Low   GOALS: Goals reviewed with patient? Yes  SHORT TERM GOALS: Target date: 02/04/2024  Pt will be 50% compliant with HEP to self manage her symptoms.  Baseline: Goal status: INITIAL   LONG TERM GOALS: Target date: 03/03/2024   Patient will demo 15 points improvement on Modified Oswestery test to improve overall function.  Baseline: 28% (01/11/24) Goal status: INITIAL  2.  Pt will be able to  perform 5x/ sit to stand in <18 seconds to improve functional strength.  Baseline: 28 sec (01/11/24) Goal status: INITIAL  3.  Patient will report pain at worst of >6/10 at to improve self management of pain. Baseline: 9/10 (01/11/24) Goal status: INITIAL  4.  Pt will be 100% compliant with HEP to self manage her symptoms to improve self management.  Baseline: Issued today. Goal status: INITIAL   PLAN:  PT FREQUENCY: 2x/week  PT DURATION: 8 weeks  PLANNED INTERVENTIONS: 97164- PT Re-evaluation, 97110-Therapeutic exercises, 97530- Therapeutic activity, 97112- Neuromuscular re-education, 97535- Self Care, 78295- Manual therapy, [phone]- Gait training, [phone]- Electrical stimulation (unattended), Patient/Family education, Balance training, Stair training, Dry Needling, Joint manipulation, Spinal mobilization, Cryotherapy, and Moist heat.  PLAN FOR NEXT SESSION: Review ODI score, review HEP, practice steps, manual therapy to back, assess lumbar AROM   Kristine Phalen, PT 02/02/2024, 12:36 PM

## 2024-02-04 ENCOUNTER — Ambulatory Visit

## 2024-02-04 DIAGNOSIS — M5459 Other low back pain: Secondary | ICD-10-CM

## 2024-02-04 NOTE — Therapy (Signed)
 OUTPATIENT PHYSICAL THERAPY THORACOLUMBAR TREATMENT NOTE   Patient Name: Holly Beasley MRN: [id number] DOB:1950-12-25, 73 y.o., female Today's Date: 02/04/2024  END OF SESSION:  PT End of Session - 02/04/24 1010     Visit Number 7    Number of Visits 13    Date for PT Re-Evaluation 02/22/24    PT Start Time 1015    PT Stop Time 1100    PT Time Calculation (min) 45 min    Equipment Utilized During Treatment Gait belt    Activity Tolerance Patient tolerated treatment well    Behavior During Therapy WFL for tasks assessed/performed              Past Medical History:  Diagnosis Date   Allergy    seasonal,take shots   Cataract    bilateral,removed 2019   Depression    Diabetes mellitus without complication (HCC)    Dysrhythmia    SVTs   GERD (gastroesophageal reflux disease)    History of ETOH abuse    recovering  in remission   Hyperlipidemia    Hypertension    Osteoarthritis    Osteopenia    dexa -1.7 h -1.35    Scoliosis    mild scoliosis, leg length discrepancy   Skin cancer    basal cell nose   Sleep apnea    did not meet criteria for cpap-has mouthpiece not using per pt   Substance abuse (HCC)    etoh 40 years ago   SVT (supraventricular tachycardia) (HCC) 08/15/2016   Varicose veins    Wears contact lenses    Past Surgical History:  Procedure Laterality Date   ANTERIOR FUSION CERVICAL SPINE     [id number]   BREAST BIOPSY     2003-rt   CATARACT EXTRACTION, BILATERAL  2019   COLONOSCOPY W/ POLYPECTOMY  4782,9562, 11-2022   precancerous   2004   DENTAL SURGERY  2015   bone graft    KNEE ARTHROSCOPY WITH LATERAL MENISECTOMY Left 11/10/2014   Procedure: LEFT KNEE ARTHROSCOPY PARTIAL LATERAL MENISECTOMY CHONDROPLASTY;  Surgeon: Alphonzo Ask, MD;  Location: [facility];  Service: Orthopedics;  Laterality: Left;   NASAL SINUS SURGERY  11/13/2008   polyps removed   skin cancer removal     basal cell nose 2004   TONSILLECTOMY AND  ADENOIDECTOMY  1960   TOTAL KNEE ARTHROPLASTY Left 02/03/2023   Procedure: LEFT TOTAL KNEE ARTHROPLASTY;  Surgeon: Dayne Even, MD;  Location: [facility];  Service: Orthopedics;  Laterality: Left;   VENOUS ABLATION Left 2015   Dr Timm Foot   Patient Active Problem List   Diagnosis Date Noted   Primary osteoarthritis of left knee 02/03/2023   HLD (hyperlipidemia) 04/14/2016   Essential hypertension 12/18/2014   Medication management 12/18/2014   Varicose veins of leg with complications 10/09/2014   Left knee pain 09/21/2014   Tear of meniscus of knee 09/21/2014   Hx of bacterial sinusitis 08/28/2014   Knee pain, acute 06/26/2014   Leg edema, left 06/20/2014   Varicose veins of bilateral lower extremities with other complications 06/20/2014   Varicose veins 05/03/2014   Inguinal adenopathy by ultrasound doppler 05/03/2014   Left leg swelling 04/25/2014   Diabetes mellitus type 2, controlled, without complications (HCC) 04/25/2014   Visit for preventive health examination 06/07/2012   Foot dermatitis 06/07/2012   Shortness of breath 11/06/2011   Nausea 11/06/2011   Hand eczema 06/27/2011   Prediabetes 03/20/2011   Preventative health care 03/20/2011  Dry eyes    Headache 12/30/2010   Scoliosis    Osteopenia    History of ETOH abuse    Sinusitis, chronic 02/27/2010   LIBIDO, DECREASED 02/27/2010   GERD 02/20/2009   CONSTIPATION, CHRONIC 02/20/2009   SKIN CANCER, HX OF 02/20/2009   OSA (obstructive sleep apnea) 03/21/2008   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 Daphine Eagle  REFERRING PROVIDER: Vickii Grand, PA-C  REFERRING DIAG: spondylolisthesis  Rationale for Evaluation and Treatment: Rehabilitation  THERAPY DIAG:  No diagnosis found.  ONSET DATE: 01/01/2024- date of referral  SUBJECTIVE:                                                                                                                                                                                           SUBJECTIVE STATEMENT: Pt reports supine figure 4 stretch is still tough. Pain is about 3-4/10 PERTINENT HISTORY:  L TKA 02/03/23. PMH: SVT, ACDF,DM  PAIN:  Are you having pain? Yes: NPRS scale: 4-5/10 Pain location: back pain travels down to  Pain description: sharp, radiating Aggravating factors: Standing for prolonged periods of time; walking, getting up after sitting for a long time. Relieving factors: sitting, lying  PRECAUTIONS: None  RED FLAGS: None   WEIGHT BEARING RESTRICTIONS: No  FALLS:  Has patient fallen in last 6 months? No  PLOF: Independent  PATIENT GOALS: manage back pain    OBJECTIVE:  Note: Objective measures were completed at Evaluation unless otherwise noted.  DIAGNOSTIC FINDINGS:  11/28/23 MRI of lumbar spine IMPRESSION: 1. Severe L4-5 facet arthrosis with grade 1 anterolisthesis, severe spinal stenosis, and moderate bilateral neural foraminal stenosis. 2. Mild spinal stenosis and moderate left neural foraminal stenosis at L3-4.  PATIENT SURVEYS:  Modified Oswestry 14/50 = 28% disability   COGNITION: Overall cognitive status: Within functional limits for tasks assessed       LUMBAR ROM:   AROM eval  Flexion   Extension   Right lateral flexion   Left lateral flexion   Right rotation   Left rotation    (Blank rows = not tested)  LOWER EXTREMITY ROM:  Active  Right eval Left eval  Hip flexion     Hip extension    Hip abduction    Hip adduction    Hip internal rotation    Hip external rotation    Knee flexion    Knee extension    Ankle dorsiflexion    Ankle plantarflexion    Ankle inversion    Ankle eversion     (Blank rows = not tested)  LOWER EXTREMITY MMT:    MMT Right eval Left eval  Hip flexion 5 5  Hip extension    Hip abduction 5 5  Hip adduction 5 5  Hip internal rotation    Hip external rotation    Knee flexion 5 5  Knee extension 5 5  Ankle dorsiflexion 5 5  Ankle plantarflexion    Ankle inversion    Ankle eversion     (Blank rows = not tested)  FUNCTIONAL TESTS:  5 times sit to stand: 28 sec without UE support  TREATMENT DATE:  02/04/24  Shelah Derry over subjective report of pain and brought to her attention that her pain reporting is gradually improving as we went from 5-6/10 to 3-4/10 today.   Supine figure 4 stretch: 3 x 30" R and L Supine double leg lift in hooklying: placed 4" box under feet to make it easier for patient: 2 x 10, pt squeezing 1lb ball between knees Supine lower trunk rotations in hooklying with 1lb between knees 10x Supine lower trunk rotations with bil feet on swiss ball and knees extended: 10x emphasis on core stability Supine hamstring curls with feet on swiss ball: x 10  Standing lumbar extensions: 10x with back against countertop. Seated resisted trunk twists with yellow sport cord: 15x R and L Went over long term goal #3 for pain and educated pt that even though she feel "about the same" overtime, her pain reporting has improved from 9/10 to 3-4/10. Pt educated on modifying activities with vacuuming or cleaning.  PATIENT EDUCATION:  Education details: see above Person educated: Patient Education method: Explanation Education comprehension: verbalized understanding  HOME EXERCISE PROGRAM: Access Code: [email or web address] URL: [email or web address] Date: 01/28/2024 Prepared by: Susanna Epley  Exercises - Sit to  Stand  - 1 x daily - 7 x weekly - 10 reps - Supine Hip External Rotation  - 2 x daily - 7 x weekly - 2-3 reps - 30 sec hold - Supine March  - 2 x daily - 7 x weekly - 2 sets - 10 reps - Supine Heel Slide  - 1 x daily - 7 x weekly - 2 sets - 10 reps - Straight Leg Raise  - 1 x daily - 7 x weekly - 2 sets - 10 reps - Seated Slump Nerve Glide  - 3 x daily - 7 x weekly - 15 reps - Seated Hamstring Curl with Anchored Resistance  - 1 x daily - 7 x weekly - 2 sets - 10 reps ASSESSMENT:  CLINICAL IMPRESSION: Pt's segemental mobility with flexion and extension in lumbar spine is improving gradually. Pt reporting gradual improvements in her pain and demo 100% compliance with HEP.  OBJECTIVE IMPAIRMENTS: decreased activity tolerance, decreased endurance, decreased mobility, difficulty walking, decreased strength, hypomobility, increased muscle spasms, impaired flexibility, postural dysfunction, and pain.   ACTIVITY LIMITATIONS: carrying, lifting, bending, standing, squatting, and sleeping  PARTICIPATION LIMITATIONS: meal prep, cleaning, laundry, shopping, and community activity  PERSONAL FACTORS: Time since onset of injury/illness/exacerbation and 1-2 comorbidities: C4-6 ACDF, L TKR 4/24  are also affecting patient's functional outcome.   REHAB POTENTIAL: Good  CLINICAL DECISION MAKING: Stable/uncomplicated  EVALUATION COMPLEXITY: Low   GOALS: Goals reviewed with patient? Yes  SHORT TERM GOALS: Target date: 02/04/2024  Pt will be 50% compliant with HEP to self manage her symptoms.  Baseline: Goal status: INITIAL   LONG TERM GOALS: Target date: 03/03/2024   Patient will demo 15 points improvement on Modified Oswestery test to improve overall function.  Baseline: 28% (01/11/24) Goal status: INITIAL  2.  Pt will be able to perform 5x/ sit to stand in <18 seconds to improve functional strength.  Baseline: 28 sec (01/11/24) Goal status: INITIAL  3.  Patient will report pain at worst of  >6/10 at to improve self management of pain. Baseline: 9/10 (01/11/24); 3-4/10 (02/04/24) Goal status: Goal met   4.  Pt will be 100% compliant with HEP to self manage her symptoms to improve self management.  Baseline: Issued today. Goal status: INITIAL   PLAN:  PT FREQUENCY: 2x/week  PT DURATION: 8 weeks  PLANNED INTERVENTIONS: 97164- PT Re-evaluation, 97110-Therapeutic exercises, 97530- Therapeutic activity, 97112- Neuromuscular re-education, 97535- Self Care, 96045- Manual therapy, [phone]- Gait training, [phone]- Electrical stimulation (unattended), Patient/Family education, Balance training, Stair training, Dry Needling, Joint manipulation, Spinal mobilization, Cryotherapy, and Moist heat.  PLAN FOR NEXT SESSION: Review ODI score, review HEP, practice steps, manual therapy to back, assess lumbar AROM   Kristine Phalen, PT 02/04/2024, 10:17 AM

## 2024-02-09 ENCOUNTER — Ambulatory Visit

## 2024-02-09 DIAGNOSIS — M5459 Other low back pain: Secondary | ICD-10-CM

## 2024-02-09 NOTE — Therapy (Signed)
 OUTPATIENT PHYSICAL THERAPY THORACOLUMBAR TREATMENT NOTE   Patient Name: Holly Beasley MRN: [id number] DOB:1950-12-30, 73 y.o., female Today's Date: 02/09/2024  END OF SESSION:  PT End of Session - 02/09/24 1107     Visit Number 8    Number of Visits 13    Date for PT Re-Evaluation 02/22/24    Authorization Type [employer]    PT Start Time 1105    PT Stop Time 1144    PT Time Calculation (min) 39 min    Equipment Utilized During Treatment Gait belt    Activity Tolerance Patient tolerated treatment well    Behavior During Therapy WFL for tasks assessed/performed               Past Medical History:  Diagnosis Date   Allergy    seasonal,take shots   Cataract    bilateral,removed 2019   Depression    Diabetes mellitus without complication (HCC)    Dysrhythmia    SVTs   GERD (gastroesophageal reflux disease)    History of ETOH abuse    recovering  in remission   Hyperlipidemia    Hypertension    Osteoarthritis    Osteopenia    dexa -1.7 h -1.35    Scoliosis    mild scoliosis, leg length discrepancy   Skin cancer    basal cell nose   Sleep apnea    did not meet criteria for cpap-has mouthpiece not using per pt   Substance abuse (HCC)    etoh 40 years ago   SVT (supraventricular tachycardia) (HCC) 08/15/2016   Varicose veins    Wears contact lenses    Past Surgical History:  Procedure Laterality Date   ANTERIOR FUSION CERVICAL SPINE     [phone]   BREAST BIOPSY     2003-rt   CATARACT EXTRACTION, BILATERAL  2019   COLONOSCOPY W/ POLYPECTOMY  4782,9562, 11-2022   precancerous   2004   DENTAL SURGERY  2015   bone graft    KNEE ARTHROSCOPY WITH LATERAL MENISECTOMY Left 11/10/2014   Procedure: LEFT KNEE ARTHROSCOPY PARTIAL LATERAL MENISECTOMY CHONDROPLASTY;  Surgeon: Alphonzo Ask, MD;  Location: [facility];  Service: Orthopedics;  Laterality: Left;   NASAL SINUS SURGERY  11/13/2008   polyps removed   skin cancer removal     basal cell  nose 2004   TONSILLECTOMY AND ADENOIDECTOMY  1960   TOTAL KNEE ARTHROPLASTY Left 02/03/2023   Procedure: LEFT TOTAL KNEE ARTHROPLASTY;  Surgeon: Dayne Even, MD;  Location: [facility];  Service: Orthopedics;  Laterality: Left;   VENOUS ABLATION Left 2015   Dr Timm Foot   Patient Active Problem List   Diagnosis Date Noted   Primary osteoarthritis of left knee 02/03/2023   HLD (hyperlipidemia) 04/14/2016   Essential hypertension 12/18/2014   Medication management 12/18/2014   Varicose veins of leg with complications 10/09/2014   Left knee pain 09/21/2014   Tear of meniscus of knee 09/21/2014   Hx of bacterial sinusitis 08/28/2014   Knee pain, acute 06/26/2014   Leg edema, left 06/20/2014   Varicose veins of bilateral lower extremities with other complications 06/20/2014   Varicose veins 05/03/2014   Inguinal adenopathy by ultrasound doppler 05/03/2014   Left leg swelling 04/25/2014   Diabetes mellitus type 2, controlled, without complications (HCC) 04/25/2014   Visit for preventive health examination 06/07/2012   Foot dermatitis 06/07/2012   Shortness of breath 11/06/2011   Nausea 11/06/2011   Hand eczema 06/27/2011   Prediabetes 03/20/2011  Preventative health care 03/20/2011   Dry eyes    Headache 12/30/2010   Scoliosis    Osteopenia    History of ETOH abuse    Sinusitis, chronic 02/27/2010   LIBIDO, DECREASED 02/27/2010   GERD 02/20/2009   CONSTIPATION, CHRONIC 02/20/2009   SKIN CANCER, HX OF 02/20/2009   OSA (obstructive sleep apnea) 03/21/2008   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 Daphine Eagle  REFERRING PROVIDER: Vickii Grand, PA-C  REFERRING DIAG: spondylolisthesis  Rationale for Evaluation and Treatment: Rehabilitation  THERAPY DIAG:  Other low back pain  ONSET DATE: 01/01/2024- date of referral  SUBJECTIVE:                                                                                                                                                                                           SUBJECTIVE STATEMENT: Pt reports she is having 3-4 pain today, says it's "not too bad today". Pt reports that occasional movements cause the pain to shoot down L side of her hips/leg.  Pt felt pretty good after last visit with Hosey Macadam. She reports her HEP is going well, figure-4 exercise is still difficult. 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  Pt wanting to review her exercises today.   PERTINENT HISTORY:  L TKA 02/03/23. PMH: SVT, ACDF,DM  PAIN:  Are you having pain? Yes: NPRS scale: 3-4/10 Pain location: back pain travels down her LLE Pain description: sharp, radiating Aggravating factors: Standing for prolonged periods of time; walking, getting up after sitting for a long time. Relieving factors: sitting, lying  PRECAUTIONS: None  RED FLAGS: None   WEIGHT BEARING RESTRICTIONS: No  FALLS:  Has patient fallen in last 6 months? No  PLOF: Independent  PATIENT GOALS: manage back pain    OBJECTIVE:  Note: Objective measures were completed at Evaluation unless otherwise noted.  DIAGNOSTIC FINDINGS:  11/28/23 MRI of lumbar spine IMPRESSION: 1. Severe L4-5 facet arthrosis with grade 1 anterolisthesis, severe spinal stenosis, and moderate bilateral neural foraminal stenosis. 2. Mild spinal stenosis and moderate left neural  foraminal stenosis at L3-4.  PATIENT SURVEYS:  Modified Oswestry 14/50 = 28% disability   COGNITION: Overall cognitive status: Within functional limits for tasks assessed       LUMBAR ROM:   AROM eval  Flexion   Extension   Right lateral flexion   Left lateral flexion   Right rotation   Left rotation    (Blank rows = not tested)  LOWER EXTREMITY ROM:     Active  Right eval Left eval  Hip flexion    Hip extension    Hip abduction    Hip adduction    Hip internal rotation    Hip external rotation    Knee flexion    Knee extension    Ankle dorsiflexion    Ankle plantarflexion    Ankle inversion    Ankle eversion     (Blank rows = not tested)  LOWER EXTREMITY MMT:    MMT Right eval Left eval  Hip flexion 5 5  Hip extension    Hip abduction 5 5  Hip adduction 5 5  Hip internal rotation    Hip external rotation    Knee flexion 5 5  Knee extension 5 5  Ankle dorsiflexion 5 5  Ankle plantarflexion    Ankle inversion    Ankle eversion     (Blank rows = not tested)  FUNCTIONAL TESTS:  5 times sit to stand: 28 sec without UE support  TREATMENT:  TherEx Review of current HEP: Supine R hip ER 3 x 30 sec each Cued to bring contralateral LE into hooklying position to decrease strain Supine marches x 10 reps B Supine heel slides x 10 reps B Supine SLR x 10 reps B Cues to activate quad and keep knee straight Seated slump nerve glide x 10 reps B Sit to stands x 10 reps, hands on thighs Seated resisted HS curls with green TB x 10 reps B Seated lumbar flexion stretch x 10 reps Standing lumbar extension stretch at countertop x 10 reps  Added to HEP: Supine sciatic nerve glide/HS stretch x 10 reps with 5 sec hold  PATIENT EDUCATION:  Education details: continue HEP, added to HEP Person educated: Patient Education method: Programmer, multimedia, Facilities manager, and Handouts Education comprehension: verbalized understanding and returned demonstration  HOME EXERCISE  PROGRAM: Access Code: [email or web address] URL: [email or web address] Date: 01/28/2024 Prepared by: Susanna Epley  Exercises - Sit to Stand  - 1 x daily - 7 x weekly - 10 reps - Supine Hip External Rotation  - 2 x daily - 7 x weekly - 2-3 reps - 30 sec hold - Supine March  - 2 x daily - 7 x weekly - 2 sets - 10 reps - Supine Heel Slide  - 1 x daily - 7 x weekly - 2 sets - 10 reps - Straight Leg Raise  - 1 x daily - 7 x weekly - 2 sets - 10 reps - Seated Slump Nerve Glide  - 3 x daily - 7 x weekly - 15 reps - Seated Hamstring Curl with Anchored Resistance  - 1 x daily - 7 x weekly - 2 sets - 10 reps - Supine 90/90 Sciatic Nerve Glide with Knee Flexion/Extension  - 1 x daily - 7 x weekly - 1-2 sets - 10 reps - 5 sec hold    ASSESSMENT:  CLINICAL IMPRESSION: Emphasis of skilled PT session on reviewing patient's current HEP and adding to her HEP to continue to address ongoing pain. Pt with some relief of  her symptoms working on stretches during her breaks during rehearsal. Pt continues to benefit from skilled PT services to work towards increased independence with management of her pain symptoms. Continue POC.   OBJECTIVE IMPAIRMENTS: decreased activity tolerance, decreased endurance, decreased mobility, difficulty walking, decreased strength, hypomobility, increased muscle spasms, impaired flexibility, postural dysfunction, and pain.   ACTIVITY LIMITATIONS: carrying, lifting, bending, standing, squatting, and sleeping  PARTICIPATION LIMITATIONS: meal prep, cleaning, laundry, shopping, and community activity  PERSONAL FACTORS: Time since onset of injury/illness/exacerbation and 1-2 comorbidities: C4-6 ACDF, L TKR 4/24  are also affecting patient's functional outcome.   REHAB POTENTIAL: Good  CLINICAL DECISION MAKING: Stable/uncomplicated  EVALUATION COMPLEXITY: Low   GOALS: Goals reviewed with patient? Yes  SHORT TERM GOALS: Target date: 02/04/2024  Pt will be 50% compliant  with HEP to self manage her symptoms.  Baseline: Goal status: MET   LONG TERM GOALS: Target date: 03/03/2024   Patient will demo 15 points improvement on Modified Oswestery test to improve overall function.  Baseline: 28% (01/11/24) Goal status: INITIAL  2.  Pt will be able to perform 5x/ sit to stand in <18 seconds to improve functional strength.  Baseline: 28 sec (01/11/24) Goal status: INITIAL  3.  Patient will report pain at worst of >6/10 at to improve self management of pain. Baseline: 9/10 (01/11/24); 3-4/10 (02/04/24) Goal status: Goal met   4.  Pt will be 100% compliant with HEP to self manage her symptoms to improve self management.  Baseline: Issued today. Goal status: INITIAL   PLAN:  PT FREQUENCY: 2x/week  PT DURATION: 8 weeks  PLANNED INTERVENTIONS: 97164- PT Re-evaluation, 97110-Therapeutic exercises, 97530- Therapeutic activity, 97112- Neuromuscular re-education, 97535- Self Care, 72536- Manual therapy, [phone]- Gait training, [phone]- Electrical stimulation (unattended), Patient/Family education, Balance training, Stair training, Dry Needling, Joint manipulation, Spinal mobilization, Cryotherapy, and Moist heat.  PLAN FOR NEXT SESSION: Review ODI score, review HEP, practice steps, manual therapy to back, assess lumbar AROM, core strengthening and stability, hip abd weakness?   Vinetta Brach, PT Lorita Rosa, PT, DPT, CSRS  02/09/2024, 11:45 AM

## 2024-02-11 ENCOUNTER — Ambulatory Visit: Attending: Internal Medicine

## 2024-02-11 DIAGNOSIS — M5459 Other low back pain: Secondary | ICD-10-CM | POA: Insufficient documentation

## 2024-02-11 NOTE — Therapy (Signed)
 OUTPATIENT PHYSICAL THERAPY THORACOLUMBAR TREATMENT NOTE   Patient Name: Holly Beasley MRN: [id number] DOB:April 24, 1951, 73 y.o., female Today's Date: 02/11/2024  END OF SESSION:  PT End of Session - 02/11/24 1154     Visit Number 9    Number of Visits 13    Date for PT Re-Evaluation 02/22/24    Authorization Type [employer]    PT Start Time 1150    PT Stop Time 1230    PT Time Calculation (min) 40 min    Equipment Utilized During Treatment Gait belt    Activity Tolerance Patient tolerated treatment well    Behavior During Therapy WFL for tasks assessed/performed               Past Medical History:  Diagnosis Date   Allergy    seasonal,take shots   Cataract    bilateral,removed 2019   Depression    Diabetes mellitus without complication (HCC)    Dysrhythmia    SVTs   GERD (gastroesophageal reflux disease)    History of ETOH abuse    recovering  in remission   Hyperlipidemia    Hypertension    Osteoarthritis    Osteopenia    dexa -1.7 h -1.35    Scoliosis    mild scoliosis, leg length discrepancy   Skin cancer    basal cell nose   Sleep apnea    did not meet criteria for cpap-has mouthpiece not using per pt   Substance abuse (HCC)    etoh 40 years ago   SVT (supraventricular tachycardia) (HCC) 08/15/2016   Varicose veins    Wears contact lenses    Past Surgical History:  Procedure Laterality Date   ANTERIOR FUSION CERVICAL SPINE     [phone]   BREAST BIOPSY     2003-rt   CATARACT EXTRACTION, BILATERAL  2019   COLONOSCOPY W/ POLYPECTOMY  2130,8657, 11-2022   precancerous   2004   DENTAL SURGERY  2015   bone graft    KNEE ARTHROSCOPY WITH LATERAL MENISECTOMY Left 11/10/2014   Procedure: LEFT KNEE ARTHROSCOPY PARTIAL LATERAL MENISECTOMY CHONDROPLASTY;  Surgeon: Alphonzo Ask, MD;  Location: [facility];  Service: Orthopedics;  Laterality: Left;   NASAL SINUS SURGERY  11/13/2008   polyps removed   skin cancer removal     basal cell  nose 2004   TONSILLECTOMY AND ADENOIDECTOMY  1960   TOTAL KNEE ARTHROPLASTY Left 02/03/2023   Procedure: LEFT TOTAL KNEE ARTHROPLASTY;  Surgeon: Dayne Even, MD;  Location: [facility];  Service: Orthopedics;  Laterality: Left;   VENOUS ABLATION Left 2015   Dr Timm Foot   Patient Active Problem List   Diagnosis Date Noted   Primary osteoarthritis of left knee 02/03/2023   HLD (hyperlipidemia) 04/14/2016   Essential hypertension 12/18/2014   Medication management 12/18/2014   Varicose veins of leg with complications 10/09/2014   Left knee pain 09/21/2014   Tear of meniscus of knee 09/21/2014   Hx of bacterial sinusitis 08/28/2014   Knee pain, acute 06/26/2014   Leg edema, left 06/20/2014   Varicose veins of bilateral lower extremities with other complications 06/20/2014   Varicose veins 05/03/2014   Inguinal adenopathy by ultrasound doppler 05/03/2014   Left leg swelling 04/25/2014   Diabetes mellitus type 2, controlled, without complications (HCC) 04/25/2014   Visit for preventive health examination 06/07/2012   Foot dermatitis 06/07/2012   Shortness of breath 11/06/2011   Nausea 11/06/2011   Hand eczema 06/27/2011   Prediabetes 03/20/2011  Preventative health care 03/20/2011   Dry eyes    Headache 12/30/2010   Scoliosis    Osteopenia    History of ETOH abuse    Sinusitis, chronic 02/27/2010   LIBIDO, DECREASED 02/27/2010   GERD 02/20/2009   CONSTIPATION, CHRONIC 02/20/2009   SKIN CANCER, HX OF 02/20/2009   OSA (obstructive sleep apnea) 03/21/2008   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 Daphine Eagle  REFERRING PROVIDER: Vickii Grand, PA-C  REFERRING DIAG: spondylolisthesis  Rationale for Evaluation and Treatment: Rehabilitation  THERAPY DIAG:  Other low back pain  ONSET DATE: 01/01/2024- date of referral  SUBJECTIVE:                                                                                                                                                                                           SUBJECTIVE STATEMENT: Pt reports she had sesion with trainer yesterday and she is sore. Pain is 3-4/10 in the hip on L side   PERTINENT HISTORY:  L TKA 02/03/23. PMH: SVT, ACDF,DM  PAIN:  Are you having pain? Yes: NPRS scale: 3-4/10 Pain location: back pain travels down her LLE Pain description: sharp, radiating Aggravating factors: Standing for prolonged periods of time; walking, getting up after sitting for a long time. Relieving factors: sitting, lying  PRECAUTIONS: None  RED FLAGS: None   WEIGHT BEARING RESTRICTIONS: No  FALLS:  Has patient fallen in last 6 months? No  PLOF: Independent  PATIENT GOALS: manage back pain    OBJECTIVE:  Note: Objective measures were completed at Evaluation unless otherwise noted.  DIAGNOSTIC FINDINGS:  11/28/23 MRI of lumbar spine IMPRESSION: 1. Severe L4-5 facet arthrosis with grade 1 anterolisthesis, severe spinal stenosis, and moderate bilateral neural foraminal stenosis. 2. Mild spinal stenosis and moderate left neural foraminal stenosis at L3-4.  PATIENT SURVEYS:  Modified Oswestry 14/50 = 28% disability   COGNITION: Overall cognitive status: Within functional limits for tasks assessed       LUMBAR ROM:   AROM eval  Flexion   Extension   Right lateral flexion   Left lateral flexion   Right rotation  Left rotation    (Blank rows = not  tested)  LOWER EXTREMITY ROM:     Active  Right eval Left eval  Hip flexion    Hip extension    Hip abduction    Hip adduction    Hip internal rotation    Hip external rotation    Knee flexion    Knee extension    Ankle dorsiflexion    Ankle plantarflexion    Ankle inversion    Ankle eversion     (Blank rows = not tested)  LOWER EXTREMITY MMT:    MMT Right eval Left eval  Hip flexion 5 5  Hip extension    Hip abduction 5 5  Hip adduction 5 5  Hip internal rotation    Hip external rotation    Knee flexion 5 5  Knee extension 5 5  Ankle dorsiflexion 5 5  Ankle plantarflexion    Ankle inversion    Ankle eversion     (Blank rows = not tested)  FUNCTIONAL TESTS:  5 times sit to stand: 28 sec without UE support  TREATMENT:  TherEx Supine piriformis stretch: 3 x 30" R and L, pt was able to feel stretch in posterior hip compared to few session ago when she couldn't feel the stretch and she felt anterior hip impingement instead. This is why she was given supine figure 4 stretch to help resolve some anterior hip tightness. Pt was educated on this progress and this was added to HEP to replace figure 4. Supine hooklying double leg lift and lowering to mat table: 3 x 5, felt mild discomfort in lumbar spine due to poor core stability SL hip abduction: 2 x 10 R and L SL clamshells: 2 x 10 R and L Fwd step up: 6" box: 2 x 10 R and L Lateral lunges with ipsilateral LE on 6" box to improve glut activation: partial range: 2 x 10 R and L Forward lumbar flexion, seated: 15x Slump neural flossing: 10x R and L Seated trunk twists: 5x R and L  Added to HEP: Supine sciatic nerve glide/HS stretch x 10 reps with 5 sec hold  PATIENT EDUCATION:  Education details: continue HEP, added to HEP Person educated: Patient Education method: Explanation, Demonstration, and Handouts Education comprehension: verbalized understanding and returned demonstration  HOME EXERCISE PROGRAM: Access  Code: [email or web address] URL: [email or web address] Date: 02/11/2024 Prepared by: Susanna Epley  Exercises - Sit to Stand  - 1 x daily - 7 x weekly - 10 reps - Supine Hip External Rotation  - 2 x daily - 7 x weekly - 2-3 reps - 30 sec hold - Supine March  - 2 x daily - 7 x weekly - 2 sets - 10 reps - Supine Heel Slide  - 1 x daily - 7 x weekly - 2 sets - 10 reps - Straight Leg Raise  - 1 x daily - 7 x weekly - 2 sets - 10 reps - Seated Slump Nerve Glide  - 3 x daily - 7 x weekly - 15 reps - Seated Hamstring Curl with Anchored Resistance  - 1 x daily - 7 x weekly - 2 sets - 10 reps - Seated Flexion Stretch  - 1-2 x daily - 7 x weekly - 10 reps - 10 sec hold - Standing Lumbar Extension with Counter  - 1-2 x daily - 7 x weekly - 10 reps - Supine 90/90 Sciatic Nerve Glide with Knee Flexion/Extension  - 1 x daily - 7  x weekly - 1-2 sets - 10 reps - 5 sec hold - Supine Piriformis Stretch with Foot on Ground  - 1 x daily - 7 x weekly - 3 reps - 30 sec hold    ASSESSMENT:  CLINICAL IMPRESSION: Emphasis of skilled PT session on progressing HEP and improving core/hip stability/strengtheing. Pt reported no anterior hip impingement today with piriformis stretch in L LE.   OBJECTIVE IMPAIRMENTS: decreased activity tolerance, decreased endurance, decreased mobility, difficulty walking, decreased strength, hypomobility, increased muscle spasms, impaired flexibility, postural dysfunction, and pain.   ACTIVITY LIMITATIONS: carrying, lifting, bending, standing, squatting, and sleeping  PARTICIPATION LIMITATIONS: meal prep, cleaning, laundry, shopping, and community activity  PERSONAL FACTORS: Time since onset of injury/illness/exacerbation and 1-2 comorbidities: C4-6 ACDF, L TKR 4/24  are also affecting patient's functional outcome.   REHAB POTENTIAL: Good  CLINICAL DECISION MAKING: Stable/uncomplicated  EVALUATION COMPLEXITY: Low   GOALS: Goals reviewed with patient? Yes  SHORT TERM  GOALS: Target date: 02/04/2024  Pt will be 50% compliant with HEP to self manage her symptoms.  Baseline: Goal status: MET   LONG TERM GOALS: Target date: 03/03/2024   Patient will demo 15 points improvement on Modified Oswestery test to improve overall function.  Baseline: 28% (01/11/24) Goal status: INITIAL  2.  Pt will be able to perform 5x/ sit to stand in <18 seconds to improve functional strength.  Baseline: 28 sec (01/11/24) Goal status: INITIAL  3.  Patient will report pain at worst of >6/10 at to improve self management of pain. Baseline: 9/10 (01/11/24); 3-4/10 (02/04/24) Goal status: Goal met   4.  Pt will be 100% compliant with HEP to self manage her symptoms to improve self management.  Baseline: Issued today. Goal status: INITIAL   PLAN:  PT FREQUENCY: 2x/week  PT DURATION: 8 weeks  PLANNED INTERVENTIONS: 97164- PT Re-evaluation, 97110-Therapeutic exercises, 97530- Therapeutic activity, 97112- Neuromuscular re-education, 97535- Self Care, 16109- Manual therapy, [phone]- Gait training, [phone]- Electrical stimulation (unattended), Patient/Family education, Balance training, Stair training, Dry Needling, Joint manipulation, Spinal mobilization, Cryotherapy, and Moist heat.  PLAN FOR NEXT SESSION: Review ODI score, review HEP, practice steps, manual therapy to back, assess lumbar AROM, core strengthening and stability, hip abd weakness?   Kristine Phalen, PT   02/11/2024, 12:32 PM

## 2024-02-16 ENCOUNTER — Ambulatory Visit

## 2024-02-16 DIAGNOSIS — M5459 Other low back pain: Secondary | ICD-10-CM

## 2024-02-16 NOTE — Therapy (Signed)
 OUTPATIENT PHYSICAL THERAPY THORACOLUMBAR TREATMENT NOTE   Patient Name: Holly Beasley MRN: [id number] DOB:Apr 05, 1951, 73 y.o., female Today's Date: 02/16/2024  END OF SESSION:  PT End of Session - 02/16/24 1111     Visit Number 10    Number of Visits 13    Date for PT Re-Evaluation 03/15/24    Authorization Type [employer]    PT Start Time 1107    PT Stop Time 1150    PT Time Calculation (min) 43 min    Equipment Utilized During Treatment Gait belt    Activity Tolerance Patient tolerated treatment well    Behavior During Therapy WFL for tasks assessed/performed               Past Medical History:  Diagnosis Date   Allergy    seasonal,take shots   Cataract    bilateral,removed 2019   Depression    Diabetes mellitus without complication (HCC)    Dysrhythmia    SVTs   GERD (gastroesophageal reflux disease)    History of ETOH abuse    recovering  in remission   Hyperlipidemia    Hypertension    Osteoarthritis    Osteopenia    dexa -1.7 h -1.35    Scoliosis    mild scoliosis, leg length discrepancy   Skin cancer    basal cell nose   Sleep apnea    did not meet criteria for cpap-has mouthpiece not using per pt   Substance abuse (HCC)    etoh 40 years ago   SVT (supraventricular tachycardia) (HCC) 08/15/2016   Varicose veins    Wears contact lenses    Past Surgical History:  Procedure Laterality Date   ANTERIOR FUSION CERVICAL SPINE     [phone]   BREAST BIOPSY     2003-rt   CATARACT EXTRACTION, BILATERAL  2019   COLONOSCOPY W/ POLYPECTOMY  [id number],[id number], 11-2022   precancerous   2004   DENTAL SURGERY  2015   bone graft    KNEE ARTHROSCOPY WITH LATERAL MENISECTOMY Left 11/10/2014   Procedure: LEFT KNEE ARTHROSCOPY PARTIAL LATERAL MENISECTOMY CHONDROPLASTY;  Surgeon: Alphonzo Ask, MD;  Location: [facility];  Service: Orthopedics;  Laterality: Left;   NASAL SINUS SURGERY  11/13/2008   polyps removed   skin cancer removal     basal cell  nose 2004   TONSILLECTOMY AND ADENOIDECTOMY  1960   TOTAL KNEE ARTHROPLASTY Left 02/03/2023   Procedure: LEFT TOTAL KNEE ARTHROPLASTY;  Surgeon: Dayne Even, MD;  Location: [facility];  Service: Orthopedics;  Laterality: Left;   VENOUS ABLATION Left 2015   Dr Timm Foot   Patient Active Problem List   Diagnosis Date Noted   Primary osteoarthritis of left knee 02/03/2023   HLD (hyperlipidemia) 04/14/2016   Essential hypertension 12/18/2014   Medication management 12/18/2014   Varicose veins of leg with complications 10/09/2014   Left knee pain 09/21/2014   Tear of meniscus of knee 09/21/2014   Hx of bacterial sinusitis 08/28/2014   Knee pain, acute 06/26/2014   Leg edema, left 06/20/2014   Varicose veins of bilateral lower extremities with other complications 06/20/2014   Varicose veins 05/03/2014   Inguinal adenopathy by ultrasound doppler 05/03/2014   Left leg swelling 04/25/2014   Diabetes mellitus type 2, controlled, without complications (HCC) 04/25/2014   Visit for preventive health examination 06/07/2012   Foot dermatitis 06/07/2012   Shortness of breath 11/06/2011   Nausea 11/06/2011   Hand eczema 06/27/2011   Prediabetes 03/20/2011  Preventative health care 03/20/2011   Dry eyes    Headache 12/30/2010   Scoliosis    Osteopenia    History of ETOH abuse    Sinusitis, chronic 02/27/2010   LIBIDO, DECREASED 02/27/2010   GERD 02/20/2009   CONSTIPATION, CHRONIC 02/20/2009   SKIN CANCER, HX OF 02/20/2009   OSA (obstructive sleep apnea) 03/21/2008   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 Daphine Eagle  REFERRING PROVIDER: Vickii Grand, PA-C  REFERRING DIAG: spondylolisthesis  Rationale for Evaluation and Treatment: Rehabilitation  THERAPY DIAG:  Other low back pain  ONSET DATE: 01/01/2024- date of referral  SUBJECTIVE:                                                                                                                                                                                           SUBJECTIVE STATEMENT: Pt reports she still experiences L hip pain especially when standing or changing position. Yesterday pain was 6/10.   PERTINENT HISTORY:  L TKA 02/03/23. PMH: SVT, ACDF,DM  PAIN:  Are you having pain? Yes: NPRS scale: 3-4/10 Pain location: back pain travels down her LLE Pain description: sharp, radiating Aggravating factors: Standing for prolonged periods of time; walking, getting up after sitting for a long time. Relieving factors: sitting, lying  PRECAUTIONS: None  RED FLAGS: None   WEIGHT BEARING RESTRICTIONS: No  FALLS:  Has patient fallen in last 6 months? No  PLOF: Independent  PATIENT GOALS: manage back pain    OBJECTIVE:  Note: Objective measures were completed at Evaluation unless otherwise noted.  DIAGNOSTIC FINDINGS:  11/28/23 MRI of lumbar spine IMPRESSION: 1. Severe L4-5 facet arthrosis with grade 1 anterolisthesis, severe spinal stenosis, and moderate bilateral neural foraminal stenosis. 2. Mild spinal stenosis and moderate left neural foraminal stenosis at L3-4.  PATIENT SURVEYS:  Modified Oswestry 14/50 = 28% disability   COGNITION: Overall cognitive status: Within functional limits for tasks assessed       LUMBAR ROM:   AROM eval  Flexion   Extension   Right lateral flexion   Left lateral flexion   Right rotation   Left rotation    (  Blank rows =  not tested)  LOWER EXTREMITY ROM:     Active  Right eval Left eval  Hip flexion    Hip extension    Hip abduction    Hip adduction    Hip internal rotation    Hip external rotation    Knee flexion    Knee extension    Ankle dorsiflexion    Ankle plantarflexion    Ankle inversion    Ankle eversion     (Blank rows = not tested)  LOWER EXTREMITY MMT:    MMT Right eval Left eval  Hip flexion 5 5  Hip extension    Hip abduction 5 5  Hip adduction 5 5  Hip internal rotation    Hip external rotation    Knee flexion 5 5  Knee extension 5 5  Ankle dorsiflexion 5 5  Ankle plantarflexion    Ankle inversion    Ankle eversion     (Blank rows = not tested)  FUNCTIONAL TESTS:  5 times sit to stand: 28 sec without UE support  TREATMENT:   Recert performed today Sit to stand: 2 x 5, emphasis on using gluts to activate for 2nd set and pt reported significant improvedment on her pereceived pain Stiff legged dead lift: 2 x 10, emphasis on slow eccentric stretching and mm activation with concentric phase. SL slow clamshells: 3 x 10 L only Supine ab bracing with leg lifts: bil: 2 x 10 Deep tissue work to [employer]  [place] [employer] in stretching exercises and strengthenign exercises. Educated pt to prioritize on deadlift, clamshells and ab bracing with double leg lift and educated to do other exercise on as needed basis as well.   PATIENT EDUCATION:  Education details: continue HEP, added to HEP Person educated: Patient Education method: Explanation, Demonstration, and Handouts Education comprehension: verbalized understanding and returned demonstration  HOME EXERCISE PROGRAM: Access Code: [email or web address] URL: [email or web address] Date: 02/11/2024 Prepared by: Susanna Epley  Exercises - Sit to Stand  - 1 x daily - 7 x weekly - 10 reps - Supine Hip External Rotation  - 2 x daily - 7 x weekly - 2-3 reps - 30 sec hold - Supine March  - 2 x daily - 7  x weekly - 2 sets - 10 reps - Supine Heel Slide  - 1 x daily - 7 x weekly - 2 sets - 10 reps - Straight Leg Raise  - 1 x daily - 7 x weekly - 2 sets - 10 reps - Seated Slump Nerve Glide  - 3 x daily - 7 x weekly - 15 reps - Seated Hamstring Curl with Anchored Resistance  - 1 x daily - 7 x weekly - 2 sets - 10 reps - Seated Flexion Stretch  - 1-2 x daily - 7 x weekly - 10 reps - 10 sec hold - Standing Lumbar Extension with Counter  - 1-2 x daily - 7 x weekly - 10 reps - Supine 90/90 Sciatic Nerve Glide with Knee Flexion/Extension  - 1 x daily - 7 x weekly - 1-2 sets - 10 reps - 5 sec hold - Supine Piriformis Stretch with Foot on Ground  - 1 x daily - 7 x weekly - 3 reps - 30 sec hold    ASSESSMENT:  CLINICAL IMPRESSION: Patient has been seen for total of 10 sessions for chronic back pain and hip pain. Patient is progressing well and has met 1 out of 4 long term goals and progressing  well towards remainder of her goals. Patient is reporting ~50% improvement in her overall pain. Pt has demo improved functional strength per improvement in 5x sit to stand score. Patient will benefit from continued skilled PT to continue to improve hip strength, core stability, and progress HEP to maximize her potential for improvement.   OBJECTIVE IMPAIRMENTS: decreased activity tolerance, decreased endurance, decreased mobility, difficulty walking, decreased strength, hypomobility, increased muscle spasms, impaired flexibility, postural dysfunction, and pain.   ACTIVITY LIMITATIONS: carrying, lifting, bending, standing, squatting, and sleeping  PARTICIPATION LIMITATIONS: meal prep, cleaning, laundry, shopping, and community activity  PERSONAL FACTORS: Time since onset of injury/illness/exacerbation and 1-2 comorbidities: C4-6 ACDF, L TKR 4/24  are also affecting patient's functional outcome.   REHAB POTENTIAL: Good  CLINICAL DECISION MAKING: Stable/uncomplicated  EVALUATION COMPLEXITY:  Low   GOALS: Goals reviewed with patient? Yes  SHORT TERM GOALS: Target date: 02/04/2024  Pt will be 50% compliant with HEP to self manage her symptoms.  Baseline: Goal status: MET   LONG TERM GOALS: Target date: 03/03/2024   Patient will demo 15 points improvement on Modified Oswestery test to improve overall function.  Baseline: 28% (01/11/24); 24% (8 points) (02/16/24) Goal status: progressing continue  2.  Pt will be able to perform 5x/ sit to stand in <18 seconds to improve functional strength.  Baseline: 28 sec (01/11/24); 19 sec (02/16/24) Goal status: progressing continue  3.  Patient will report pain at worst of >6/10 at to improve self management of pain. Baseline: 9/10 (01/11/24); 3-4/10 (02/04/24) Goal status: Goal met   4.  Pt will be 100% compliant with HEP to self manage her symptoms to improve self management.  Baseline: Issued today. Goal status: INITIAL   PLAN:  PT FREQUENCY: 2x/week  PT DURATION: 4 weeks  PLANNED INTERVENTIONS: 97164- PT Re-evaluation, 97110-Therapeutic exercises, 97530- Therapeutic activity, 97112- Neuromuscular re-education, 97535- Self Care, 84132- Manual therapy, [phone]- Gait training, [phone]- Electrical stimulation (unattended), Patient/Family education, Balance training, Stair training, Dry Needling, Joint manipulation, Spinal mobilization, Cryotherapy, and Moist heat.  PLAN FOR NEXT SESSION: Review ODI score, review HEP, practice steps, manual therapy to back, assess lumbar AROM, core strengthening and stability, hip abd weakness?   Kristine Phalen, PT   02/16/2024, 11:51 AM

## 2024-02-18 ENCOUNTER — Ambulatory Visit

## 2024-02-18 DIAGNOSIS — M5459 Other low back pain: Secondary | ICD-10-CM

## 2024-02-18 NOTE — Therapy (Signed)
 OUTPATIENT PHYSICAL THERAPY THORACOLUMBAR TREATMENT NOTE   Patient Name: Holly Beasley MRN: [id number] DOB:11/04/1950, 73 y.o., female Today's Date: 02/18/2024  END OF SESSION:  PT End of Session - 02/18/24 1113     Visit Number 11    Number of Visits 13    Date for PT Re-Evaluation 03/15/24    Authorization Type [employer]    PT Start Time 1110    PT Stop Time 1150    PT Time Calculation (min) 40 min    Equipment Utilized During Treatment Gait belt    Activity Tolerance Patient tolerated treatment well    Behavior During Therapy WFL for tasks assessed/performed               Past Medical History:  Diagnosis Date   Allergy    seasonal,take shots   Cataract    bilateral,removed 2019   Depression    Diabetes mellitus without complication (HCC)    Dysrhythmia    SVTs   GERD (gastroesophageal reflux disease)    History of ETOH abuse    recovering  in remission   Hyperlipidemia    Hypertension    Osteoarthritis    Osteopenia    dexa -1.7 h -1.35    Scoliosis    mild scoliosis, leg length discrepancy   Skin cancer    basal cell nose   Sleep apnea    did not meet criteria for cpap-has mouthpiece not using per pt   Substance abuse (HCC)    etoh 40 years ago   SVT (supraventricular tachycardia) (HCC) 08/15/2016   Varicose veins    Wears contact lenses    Past Surgical History:  Procedure Laterality Date   ANTERIOR FUSION CERVICAL SPINE     [id number]   BREAST BIOPSY     2003-rt   CATARACT EXTRACTION, BILATERAL  2019   COLONOSCOPY W/ POLYPECTOMY  4132,4401, 11-2022   precancerous   2004   DENTAL SURGERY  2015   bone graft    KNEE ARTHROSCOPY WITH LATERAL MENISECTOMY Left 11/10/2014   Procedure: LEFT KNEE ARTHROSCOPY PARTIAL LATERAL MENISECTOMY CHONDROPLASTY;  Surgeon: Alphonzo Ask, MD;  Location: [facility];  Service: Orthopedics;  Laterality: Left;   NASAL SINUS SURGERY  11/13/2008   polyps removed   skin cancer removal     basal cell  nose 2004   TONSILLECTOMY AND ADENOIDECTOMY  1960   TOTAL KNEE ARTHROPLASTY Left 02/03/2023   Procedure: LEFT TOTAL KNEE ARTHROPLASTY;  Surgeon: Dayne Even, MD;  Location: [facility];  Service: Orthopedics;  Laterality: Left;   VENOUS ABLATION Left 2015   Dr Timm Foot   Patient Active Problem List   Diagnosis Date Noted   Primary osteoarthritis of left knee 02/03/2023   HLD (hyperlipidemia) 04/14/2016   Essential hypertension 12/18/2014   Medication management 12/18/2014   Varicose veins of leg with complications 10/09/2014   Left knee pain 09/21/2014   Tear of meniscus of knee 09/21/2014   Hx of bacterial sinusitis 08/28/2014   Knee pain, acute 06/26/2014   Leg edema, left 06/20/2014   Varicose veins of bilateral lower extremities with other complications 06/20/2014   Varicose veins 05/03/2014   Inguinal adenopathy by ultrasound doppler 05/03/2014   Left leg swelling 04/25/2014   Diabetes mellitus type 2, controlled, without complications (HCC) 04/25/2014   Visit for preventive health examination 06/07/2012   Foot dermatitis 06/07/2012   Shortness of breath 11/06/2011   Nausea 11/06/2011   Hand eczema 06/27/2011   Prediabetes 03/20/2011  Preventative health care 03/20/2011   Dry eyes    Headache 12/30/2010   Scoliosis    Osteopenia    History of ETOH abuse    Sinusitis, chronic 02/27/2010   LIBIDO, DECREASED 02/27/2010   GERD 02/20/2009   CONSTIPATION, CHRONIC 02/20/2009   SKIN CANCER, HX OF 02/20/2009   OSA (obstructive sleep apnea) 03/21/2008   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 Daphine Eagle  REFERRING PROVIDER: Vickii Grand, PA-C  REFERRING DIAG: spondylolisthesis  Rationale for Evaluation and Treatment: Rehabilitation  THERAPY DIAG:  Other low back pain  ONSET DATE: 01/01/2024- date of referral  SUBJECTIVE:                                                                                                                                                                                           SUBJECTIVE STATEMENT: Overall feel better. I was sore yesterday but hip sseems to be doing better.   PERTINENT HISTORY:  L TKA 02/03/23. PMH: SVT, ACDF,DM  PAIN:  Are you having pain? Yes: NPRS scale: 2-3/10 Pain location: back pain travels down her LLE Pain description: sharp, radiating Aggravating factors: Standing for prolonged periods of time; walking, getting up after sitting for a long time. Relieving factors: sitting, lying  PRECAUTIONS: None  RED FLAGS: None   WEIGHT BEARING RESTRICTIONS: No  FALLS:  Has patient fallen in last 6 months? No  PLOF: Independent  PATIENT GOALS: manage back pain    OBJECTIVE:  Note: Objective measures were completed at Evaluation unless otherwise noted.  DIAGNOSTIC FINDINGS:  11/28/23 MRI of lumbar spine IMPRESSION: 1. Severe L4-5 facet arthrosis with grade 1 anterolisthesis, severe spinal stenosis, and moderate bilateral neural foraminal stenosis. 2. Mild spinal stenosis and moderate left neural foraminal stenosis at L3-4.  PATIENT SURVEYS:  Modified Oswestry 14/50 = 28% disability   COGNITION: Overall cognitive status: Within functional limits for tasks assessed       LUMBAR ROM:   AROM eval  Flexion   Extension   Right lateral flexion   Left lateral flexion   Right rotation   Left rotation    (Blank rows =  not tested)  LOWER EXTREMITY ROM:      Active  Right eval Left eval  Hip flexion    Hip extension    Hip abduction    Hip adduction    Hip internal rotation    Hip external rotation    Knee flexion    Knee extension    Ankle dorsiflexion    Ankle plantarflexion    Ankle inversion    Ankle eversion     (Blank rows = not tested)  LOWER EXTREMITY MMT:    MMT Right eval Left eval  Hip flexion 5 5  Hip extension    Hip abduction 5 5  Hip adduction 5 5  Hip internal rotation    Hip external rotation    Knee flexion 5 5  Knee extension 5 5  Ankle dorsiflexion 5 5  Ankle plantarflexion    Ankle inversion    Ankle eversion     (Blank rows = not tested)  FUNCTIONAL TESTS:  5 times sit to stand: 28 sec without UE support  TREATMENT:   Lower trunk rotation:10x Supine bridge: 3 x 5 R and L Sit to stand: 10x from lower height of mat table, using UE to stand up but no UE with eccentric control Stiff legged dead lift: 2 x 10, emphasis on slow eccentric stretching and mm activation with concentric phase. SL slow clamshells: 3 x 10 L only, 2lbs R and L SL hip abduction 2 x 10 L only Supine ab bracing with leg lifts: bil: 3 x 10 Seated lumbar flexion: 15x  PATIENT EDUCATION:  Education details: continue HEP, added to HEP Person educated: Patient Education method: Explanation, Demonstration, and Handouts Education comprehension: verbalized understanding and returned demonstration  HOME EXERCISE PROGRAM: Access Code: [email or web address] URL: [email or web address] Date: 02/11/2024 Prepared by: Susanna Epley  Exercises - Sit to Stand  - 1 x daily - 7 x weekly - 10 reps - Supine Hip External Rotation  - 2 x daily - 7 x weekly - 2-3 reps - 30 sec hold - Supine March  - 2 x daily - 7 x weekly - 2 sets - 10 reps - Supine Heel Slide  - 1 x daily - 7 x weekly - 2 sets - 10 reps - Straight Leg Raise  - 1 x daily - 7 x weekly - 2 sets - 10 reps - Seated Slump Nerve Glide  - 3 x daily - 7 x weekly - 15 reps -  Seated Hamstring Curl with Anchored Resistance  - 1 x daily - 7 x weekly - 2 sets - 10 reps - Seated Flexion Stretch  - 1-2 x daily - 7 x weekly - 10 reps - 10 sec hold - Standing Lumbar Extension with Counter  - 1-2 x daily - 7 x weekly - 10 reps - Supine 90/90 Sciatic Nerve Glide with Knee Flexion/Extension  - 1 x daily - 7 x weekly - 1-2 sets - 10 reps - 5 sec hold - Supine Piriformis Stretch with Foot on Ground  - 1 x daily - 7 x weekly - 3 reps - 30 sec hold    ASSESSMENT:  CLINICAL IMPRESSION: Overall, pt reporting decreasing pain and improving strength in L hip. Pt is reporting compliance with HEP   OBJECTIVE IMPAIRMENTS: decreased activity tolerance, decreased endurance, decreased mobility, difficulty walking, decreased strength, hypomobility, increased muscle spasms, impaired flexibility, postural dysfunction, and pain.   ACTIVITY LIMITATIONS: carrying, lifting, bending, standing, squatting, and sleeping  PARTICIPATION LIMITATIONS: meal  prep, cleaning, laundry, shopping, and community activity  PERSONAL FACTORS: Time since onset of injury/illness/exacerbation and 1-2 comorbidities: C4-6 ACDF, L TKR 4/24 are also affecting patient's functional outcome.   REHAB POTENTIAL: Good  CLINICAL DECISION MAKING: Stable/uncomplicated  EVALUATION COMPLEXITY: Low   GOALS: Goals reviewed with patient? Yes  SHORT TERM GOALS: Target date: 02/04/2024  Pt will be 50% compliant with HEP to self manage her symptoms.  Baseline: Goal status: MET   LONG TERM GOALS: Target date: 03/03/2024   Patient will demo 15 points improvement on Modified Oswestery test to improve overall function.  Baseline: 28% (01/11/24); 24% (8 points) (02/16/24) Goal status: progressing continue  2.  Pt will be able to perform 5x/ sit to stand in <18 seconds to improve functional strength.  Baseline: 28 sec (01/11/24); 19 sec (02/16/24) Goal status: progressing continue  3.  Patient will report pain at worst of  >6/10 at to improve self management of pain. Baseline: 9/10 (01/11/24); 3-4/10 (02/04/24) Goal status: Goal met   4.  Pt will be 100% compliant with HEP to self manage her symptoms to improve self management.  Baseline: Issued today. Goal status: INITIAL   PLAN:  PT FREQUENCY: 2x/week  PT DURATION: 4 weeks  PLANNED INTERVENTIONS: 97164- PT Re-evaluation, 97110-Therapeutic exercises, 97530- Therapeutic activity, 97112- Neuromuscular re-education, 97535- Self Care, 54098- Manual therapy, [phone]- Gait training, [phone]- Electrical stimulation (unattended), Patient/Family education, Balance training, Stair training, Dry Needling, Joint manipulation, Spinal mobilization, Cryotherapy, and Moist heat.  PLAN FOR NEXT SESSION: Review ODI score, review HEP, practice steps, manual therapy to back, assess lumbar AROM, core strengthening and stability, hip abd weakness?   Kristine Phalen, PT   02/18/2024, 11:14 AM

## 2024-02-22 ENCOUNTER — Telehealth: Payer: Self-pay | Admitting: Internal Medicine

## 2024-02-22 NOTE — Telephone Encounter (Signed)
 Copied from CRM [id number]. Topic: Clinical - Prescription Issue >> Feb 22, 2024  3:52 PM Holly Beasley wrote: Reason for CRM: dapagliflozin  propanediol (FARXIGA ) 10 MG TABS tablet  Patient would like to receive a 90 supply of the medication instead of the 30 day and would like to e contacted if possible

## 2024-02-23 ENCOUNTER — Ambulatory Visit

## 2024-02-23 DIAGNOSIS — M5459 Other low back pain: Secondary | ICD-10-CM

## 2024-02-23 MED ORDER — DAPAGLIFLOZIN PROPANEDIOL 10 MG PO TABS: 10.0000 mg | ORAL_TABLET | Freq: Every day | ORAL | 0 refills | Status: DC

## 2024-02-23 NOTE — Telephone Encounter (Signed)
Yes please refill 90 days

## 2024-02-23 NOTE — Telephone Encounter (Signed)
 Rx sent.

## 2024-02-23 NOTE — Telephone Encounter (Signed)
 Pt last office visit 10/22/2023. Farxiga  increased to 10mg .  Pt is to follow up in 4 months -02/19/2024.  No appt is schedule.   Attempted to reach pt to schedule an appt. Left a voicemail to call us  back.   Okay to send Rx in for 90 days supply?  Please advise.

## 2024-02-23 NOTE — Addendum Note (Signed)
 Addended by: Tamikia Chowning on: 02/23/2024 01:37 PM   Modules accepted: Orders

## 2024-02-23 NOTE — Therapy (Signed)
 OUTPATIENT PHYSICAL THERAPY THORACOLUMBAR TREATMENT NOTE   Patient Name: Holly Beasley MRN: [id number] DOB:14-Feb-1951, 73 y.o., female Today's Date: 02/23/2024  END OF SESSION:  PT End of Session - 02/23/24 1111     Visit Number 12    Number of Visits 13    Date for PT Re-Evaluation 03/15/24    Authorization Type [employer]    PT Start Time 1105    PT Stop Time 1145    PT Time Calculation (min) 40 min    Equipment Utilized During Treatment Gait belt    Activity Tolerance Patient tolerated treatment well    Behavior During Therapy WFL for tasks assessed/performed               Past Medical History:  Diagnosis Date   Allergy    seasonal,take shots   Cataract    bilateral,removed 2019   Depression    Diabetes mellitus without complication (HCC)    Dysrhythmia    SVTs   GERD (gastroesophageal reflux disease)    History of ETOH abuse    recovering  in remission   Hyperlipidemia    Hypertension    Osteoarthritis    Osteopenia    dexa -1.7 h -1.35    Scoliosis    mild scoliosis, leg length discrepancy   Skin cancer    basal cell nose   Sleep apnea    did not meet criteria for cpap-has mouthpiece not using per pt   Substance abuse (HCC)    etoh 40 years ago   SVT (supraventricular tachycardia) (HCC) 08/15/2016   Varicose veins    Wears contact lenses    Past Surgical History:  Procedure Laterality Date   ANTERIOR FUSION CERVICAL SPINE     [phone]   BREAST BIOPSY     2003-rt   CATARACT EXTRACTION, BILATERAL  2019   COLONOSCOPY W/ POLYPECTOMY  4782,9562, 11-2022   precancerous   2004   DENTAL SURGERY  2015   bone graft    KNEE ARTHROSCOPY WITH LATERAL MENISECTOMY Left 11/10/2014   Procedure: LEFT KNEE ARTHROSCOPY PARTIAL LATERAL MENISECTOMY CHONDROPLASTY;  Surgeon: Alphonzo Ask, MD;  Location: [facility];  Service: Orthopedics;  Laterality: Left;   NASAL SINUS SURGERY  11/13/2008   polyps removed   skin cancer removal     basal cell  nose 2004   TONSILLECTOMY AND ADENOIDECTOMY  1960   TOTAL KNEE ARTHROPLASTY Left 02/03/2023   Procedure: LEFT TOTAL KNEE ARTHROPLASTY;  Surgeon: Dayne Even, MD;  Location: [facility];  Service: Orthopedics;  Laterality: Left;   VENOUS ABLATION Left 2015   Dr Timm Foot   Patient Active Problem List   Diagnosis Date Noted   Primary osteoarthritis of left knee 02/03/2023   HLD (hyperlipidemia) 04/14/2016   Essential hypertension 12/18/2014   Medication management 12/18/2014   Varicose veins of leg with complications 10/09/2014   Left knee pain 09/21/2014   Tear of meniscus of knee 09/21/2014   Hx of bacterial sinusitis 08/28/2014   Knee pain, acute 06/26/2014   Leg edema, left 06/20/2014   Varicose veins of bilateral lower extremities with other complications 06/20/2014   Varicose veins 05/03/2014   Inguinal adenopathy by ultrasound doppler 05/03/2014   Left leg swelling 04/25/2014   Diabetes mellitus type 2, controlled, without complications (HCC) 04/25/2014   Visit for preventive health examination 06/07/2012   Foot dermatitis 06/07/2012   Shortness of breath 11/06/2011   Nausea 11/06/2011   Hand eczema 06/27/2011   Prediabetes 03/20/2011  Preventative health care 03/20/2011   Dry eyes    Headache 12/30/2010   Scoliosis    Osteopenia    History of ETOH abuse    Sinusitis, chronic 02/27/2010   LIBIDO, DECREASED 02/27/2010   GERD 02/20/2009   CONSTIPATION, CHRONIC 02/20/2009   SKIN CANCER, HX OF 02/20/2009   OSA (obstructive sleep apnea) 03/21/2008   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 Daphine Eagle  REFERRING PROVIDER: Vickii Grand, PA-C  REFERRING DIAG: spondylolisthesis  Rationale for Evaluation and Treatment: Rehabilitation  THERAPY DIAG:  Other low back pain  ONSET DATE: 01/01/2024- date of referral  SUBJECTIVE:                                                                                                                                                                                           SUBJECTIVE STATEMENT: She felt more pain in L hip yesterday. Currently about 4/10   PERTINENT HISTORY:  L TKA 02/03/23. PMH: SVT, ACDF,DM  PAIN:  Are you having pain? Yes: NPRS scale: 4/10 Pain location: back pain travels down her LLE Pain description: sharp, radiating Aggravating factors: Standing for prolonged periods of time; walking, getting up after sitting for a long time. Relieving factors: sitting, lying  PRECAUTIONS: None  RED FLAGS: None   WEIGHT BEARING RESTRICTIONS: No  FALLS:  Has patient fallen in last 6 months? No  PLOF: Independent  PATIENT GOALS: manage back pain    OBJECTIVE:  Note: Objective measures were completed at Evaluation unless otherwise noted.  DIAGNOSTIC FINDINGS:  11/28/23 MRI of lumbar spine IMPRESSION: 1. Severe L4-5 facet arthrosis with grade 1 anterolisthesis, severe spinal stenosis, and moderate bilateral neural foraminal stenosis. 2. Mild spinal stenosis and moderate left neural foraminal stenosis at L3-4.  PATIENT SURVEYS:  Modified Oswestry 14/50 = 28% disability   COGNITION: Overall cognitive status: Within functional limits for tasks assessed       LUMBAR ROM:   AROM eval  Flexion   Extension   Right lateral flexion   Left lateral flexion   Right rotation   Left rotation    (Blank rows = not tested)  LOWER EXTREMITY ROM:     Active   Right eval Left eval  Hip flexion    Hip extension    Hip abduction    Hip adduction    Hip internal rotation    Hip external rotation    Knee flexion    Knee extension    Ankle dorsiflexion    Ankle plantarflexion    Ankle inversion    Ankle eversion     (Blank rows = not tested)  LOWER EXTREMITY MMT:    MMT Right eval Left eval  Hip flexion 5 5  Hip extension    Hip abduction 5 5  Hip adduction 5 5  Hip internal rotation    Hip external rotation    Knee flexion 5 5  Knee extension 5 5  Ankle dorsiflexion 5 5  Ankle plantarflexion    Ankle inversion    Ankle eversion     (Blank rows = not tested)  FUNCTIONAL TESTS:  5 times sit to stand: 28 sec without UE support  TREATMENT:   Myofascial release to lateral quadratus lumborum porder, posterior iliac crest on L side. SL hip abduction: 3 x 10 R and L SL clamshells: 3 x 15 R and L Supine bridge: 3 x 8 R and L Supine ab bracing with leg lifts: bil: 10x with manual isometric flexion pressure applied at end range of motion by PT for 5 sec before slowly lowering; 2 x 10 without any resistance Lower trunk rotations: 20x Stiff legged deadlift: tactile cues at chest and back to maintain neutral posture: 10x no resistance  PATIENT EDUCATION:  Education details: continue HEP, added to HEP Person educated: Patient Education method: Programmer, multimedia, Facilities manager, and Handouts Education comprehension: verbalized understanding and returned demonstration  HOME EXERCISE PROGRAM: Access Code: [email or web address] URL: [email or web address] Date: 02/11/2024 Prepared by: Susanna Epley  Exercises - Sit to Stand  - 1 x daily - 7 x weekly - 10 reps - Supine Hip External Rotation  - 2 x daily - 7 x weekly - 2-3 reps - 30 sec hold - Supine March  - 2 x daily - 7 x weekly - 2 sets - 10 reps - Supine Heel Slide  - 1 x daily - 7 x weekly - 2 sets - 10 reps - Straight Leg Raise  - 1 x daily - 7 x weekly - 2 sets - 10 reps - Seated  Slump Nerve Glide  - 3 x daily - 7 x weekly - 15 reps - Seated Hamstring Curl with Anchored Resistance  - 1 x daily - 7 x weekly - 2 sets - 10 reps - Seated Flexion Stretch  - 1-2 x daily - 7 x weekly - 10 reps - 10 sec hold - Standing Lumbar Extension with Counter  - 1-2 x daily - 7 x weekly - 10 reps - Supine 90/90 Sciatic Nerve Glide with Knee Flexion/Extension  - 1 x daily - 7 x weekly - 1-2 sets - 10 reps - 5 sec hold - Supine Piriformis Stretch with Foot on Ground  - 1 x daily - 7 x weekly - 3 reps - 30 sec hold    ASSESSMENT:  CLINICAL IMPRESSION: Emphasis placed on improving myofascial restriction in L lumbar spine. Pt reported pain improvement in L hip at end of the session. Pt is demo. Improve core/lumbar stability with demo of ease with exercises.   OBJECTIVE IMPAIRMENTS: decreased activity tolerance, decreased endurance, decreased mobility, difficulty walking, decreased strength, hypomobility, increased muscle spasms,  impaired flexibility, postural dysfunction, and pain.   ACTIVITY LIMITATIONS: carrying, lifting, bending, standing, squatting, and sleeping  PARTICIPATION LIMITATIONS: meal prep, cleaning, laundry, shopping, and community activity  PERSONAL FACTORS: Time since onset of injury/illness/exacerbation and 1-2 comorbidities: C4-6 ACDF, L TKR 4/24 are also affecting patient's functional outcome.   REHAB POTENTIAL: Good  CLINICAL DECISION MAKING: Stable/uncomplicated  EVALUATION COMPLEXITY: Low   GOALS: Goals reviewed with patient? Yes  SHORT TERM GOALS: Target date: 02/04/2024  Pt will be 50% compliant with HEP to self manage her symptoms.  Baseline: Goal status: MET   LONG TERM GOALS: Target date: 03/03/2024   Patient will demo 15 points improvement on Modified Oswestery test to improve overall function.  Baseline: 28% (01/11/24); 24% (8 points) (02/16/24) Goal status: progressing continue  2.  Pt will be able to perform 5x/ sit to stand in <18 seconds to  improve functional strength.  Baseline: 28 sec (01/11/24); 19 sec (02/16/24) Goal status: progressing continue  3.  Patient will report pain at worst of >6/10 at to improve self management of pain. Baseline: 9/10 (01/11/24); 3-4/10 (02/04/24) Goal status: Goal met   4.  Pt will be 100% compliant with HEP to self manage her symptoms to improve self management.  Baseline: Issued today. Goal status: INITIAL   PLAN:  PT FREQUENCY: 2x/week  PT DURATION: 4 weeks  PLANNED INTERVENTIONS: 97164- PT Re-evaluation, 97110-Therapeutic exercises, 97530- Therapeutic activity, 97112- Neuromuscular re-education, 97535- Self Care, 16109- Manual therapy, [phone]- Gait training, [phone]- Electrical stimulation (unattended), Patient/Family education, Balance training, Stair training, Dry Needling, Joint manipulation, Spinal mobilization, Cryotherapy, and Moist heat.  PLAN FOR NEXT SESSION: Review ODI score, review HEP, practice steps, manual therapy to back, assess lumbar AROM, core strengthening and stability, hip abd weakness?   Kristine Phalen, PT   02/23/2024, 11:12 AM

## 2024-02-25 ENCOUNTER — Ambulatory Visit

## 2024-02-25 DIAGNOSIS — M5459 Other low back pain: Secondary | ICD-10-CM

## 2024-02-25 NOTE — Therapy (Signed)
 OUTPATIENT PHYSICAL THERAPY THORACOLUMBAR TREATMENT NOTE   Patient Name: KAIDYNN WHITENER MRN: [id number] DOB:1950/11/19, 73 y.o., female Today's Date: 02/25/2024  END OF SESSION:  PT End of Session - 02/25/24 1113     Visit Number 13    Number of Visits 13    Date for PT Re-Evaluation 04/07/24    Authorization Type [employer]    PT Start Time 1050    PT Stop Time 1145    PT Time Calculation (min) 55 min    Equipment Utilized During Treatment Gait belt    Activity Tolerance Patient tolerated treatment well    Behavior During Therapy WFL for tasks assessed/performed               Past Medical History:  Diagnosis Date   Allergy    seasonal,take shots   Cataract    bilateral,removed 2019   Depression    Diabetes mellitus without complication (HCC)    Dysrhythmia    SVTs   GERD (gastroesophageal reflux disease)    History of ETOH abuse    recovering  in remission   Hyperlipidemia    Hypertension    Osteoarthritis    Osteopenia    dexa -1.7 h -1.35    Scoliosis    mild scoliosis, leg length discrepancy   Skin cancer    basal cell nose   Sleep apnea    did not meet criteria for cpap-has mouthpiece not using per pt   Substance abuse (HCC)    etoh 40 years ago   SVT (supraventricular tachycardia) (HCC) 08/15/2016   Varicose veins    Wears contact lenses    Past Surgical History:  Procedure Laterality Date   ANTERIOR FUSION CERVICAL SPINE     [phone]   BREAST BIOPSY     2003-rt   CATARACT EXTRACTION, BILATERAL  2019   COLONOSCOPY W/ POLYPECTOMY  6578,4696, 11-2022   precancerous   2004   DENTAL SURGERY  2015   bone graft    KNEE ARTHROSCOPY WITH LATERAL MENISECTOMY Left 11/10/2014   Procedure: LEFT KNEE ARTHROSCOPY PARTIAL LATERAL MENISECTOMY CHONDROPLASTY;  Surgeon: Alphonzo Ask, MD;  Location: [facility];  Service: Orthopedics;  Laterality: Left;   NASAL SINUS SURGERY  11/13/2008   polyps removed   skin cancer removal     basal cell  nose 2004   TONSILLECTOMY AND ADENOIDECTOMY  1960   TOTAL KNEE ARTHROPLASTY Left 02/03/2023   Procedure: LEFT TOTAL KNEE ARTHROPLASTY;  Surgeon: Dayne Even, MD;  Location: [facility];  Service: Orthopedics;  Laterality: Left;   VENOUS ABLATION Left 2015   Dr Timm Foot   Patient Active Problem List   Diagnosis Date Noted   Primary osteoarthritis of left knee 02/03/2023   HLD (hyperlipidemia) 04/14/2016   Essential hypertension 12/18/2014   Medication management 12/18/2014   Varicose veins of leg with complications 10/09/2014   Left knee pain 09/21/2014   Tear of meniscus of knee 09/21/2014   Hx of bacterial sinusitis 08/28/2014   Knee pain, acute 06/26/2014   Leg edema, left 06/20/2014   Varicose veins of bilateral lower extremities with other complications 06/20/2014   Varicose veins 05/03/2014   Inguinal adenopathy by ultrasound doppler 05/03/2014   Left leg swelling 04/25/2014   Diabetes mellitus type 2, controlled, without complications (HCC) 04/25/2014   Visit for preventive health examination 06/07/2012   Foot dermatitis 06/07/2012   Shortness of breath 11/06/2011   Nausea 11/06/2011   Hand eczema 06/27/2011   Prediabetes 03/20/2011  Preventative health care 03/20/2011   Dry eyes    Headache 12/30/2010   Scoliosis    Osteopenia    History of ETOH abuse    Sinusitis, chronic 02/27/2010   LIBIDO, DECREASED 02/27/2010   GERD 02/20/2009   CONSTIPATION, CHRONIC 02/20/2009   SKIN CANCER, HX OF 02/20/2009   OSA (obstructive sleep apnea) 03/21/2008   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 Daphine Eagle  REFERRING PROVIDER: Vickii Grand, PA-C  REFERRING DIAG: spondylolisthesis  Rationale for Evaluation and Treatment: Rehabilitation  THERAPY DIAG:  Other low back pain  ONSET DATE: 01/01/2024- date of referral  SUBJECTIVE:                                                                                                                                                                                           SUBJECTIVE STATEMENT: She felt more pain in L hip yesterday. Currently about 4/10   PERTINENT HISTORY:  L TKA 02/03/23. PMH: SVT, ACDF,DM  PAIN:  Are you having pain? Yes: NPRS scale: 4/10 Pain location: back pain travels down her LLE Pain description: sharp, radiating Aggravating factors: Standing for prolonged periods of time; walking, getting up after sitting for a long time. Relieving factors: sitting, lying  PRECAUTIONS: None  RED FLAGS: None   WEIGHT BEARING RESTRICTIONS: No  FALLS:  Has patient fallen in last 6 months? No  PLOF: Independent  PATIENT GOALS: manage back pain    OBJECTIVE:  Note: Objective measures were completed at Evaluation unless otherwise noted.  DIAGNOSTIC FINDINGS:  11/28/23 MRI of lumbar spine IMPRESSION: 1. Severe L4-5 facet arthrosis with grade 1 anterolisthesis, severe spinal stenosis, and moderate bilateral neural foraminal stenosis. 2. Mild spinal stenosis and moderate left neural foraminal stenosis at L3-4.  PATIENT SURVEYS:  Modified Oswestry 14/50 = 28% disability   COGNITION: Overall cognitive status: Within functional limits for tasks assessed       LUMBAR ROM:   AROM eval  Flexion   Extension   Right lateral flexion   Left lateral flexion   Right rotation   Left rotation    (Blank rows = not tested)  LOWER EXTREMITY ROM:     Active   Right eval Left eval  Hip flexion    Hip extension    Hip abduction    Hip adduction    Hip internal rotation    Hip external rotation    Knee flexion    Knee extension    Ankle dorsiflexion    Ankle plantarflexion    Ankle inversion    Ankle eversion     (Blank rows = not tested)  LOWER EXTREMITY MMT:    MMT Right eval Left eval  Hip flexion 5 5  Hip extension    Hip abduction 5 5  Hip adduction 5 5  Hip internal rotation    Hip external rotation    Knee flexion 5 5  Knee extension 5 5  Ankle dorsiflexion 5 5  Ankle plantarflexion    Ankle inversion    Ankle eversion     (Blank rows = not tested)  FUNCTIONAL TESTS:  5 times sit to stand: 28 sec without UE support  TREATMENT:   Myofascial release to lateral quadratus lumborum porder, posterior iliac crest on bil SL hip abduction: 3 x 10 R and L, with leg dropping off EOB to improve full stretch in quadratus lumborum SL clamshells: 3 x 15 R and L Supine bridge: 3 x 10 R and L Supine ab bracing with leg lifts: 1 lb ball between knees: 3 x 10  Lower trunk rotations: 20x Stiff legged deadlift: tactile cues at chest and back to maintain neutral posture: 10x no resistance  PATIENT EDUCATION:  Education details: continue HEP, added to HEP Person educated: Patient Education method: Programmer, multimedia, Facilities manager, and Handouts Education comprehension: verbalized understanding and returned demonstration  HOME EXERCISE PROGRAM: Access Code: [email or web address] URL: [email or web address] Date: 02/11/2024 Prepared by: Susanna Epley  Exercises - Sit to Stand  - 1 x daily - 7 x weekly - 10 reps - Supine Hip External Rotation  - 2 x daily - 7 x weekly - 2-3 reps - 30 sec hold - Supine March  - 2 x daily - 7 x weekly - 2 sets - 10 reps - Supine Heel Slide  - 1 x daily - 7 x weekly - 2 sets - 10 reps - Straight Leg Raise  - 1 x daily - 7 x weekly - 2 sets - 10 reps - Seated Slump Nerve Glide  - 3 x daily - 7 x weekly  - 15 reps - Seated Hamstring Curl with Anchored Resistance  - 1 x daily - 7 x weekly - 2 sets - 10 reps - Seated Flexion Stretch  - 1-2 x daily - 7 x weekly - 10 reps - 10 sec hold - Standing Lumbar Extension with Counter  - 1-2 x daily - 7 x weekly - 10 reps - Supine 90/90 Sciatic Nerve Glide with Knee Flexion/Extension  - 1 x daily - 7 x weekly - 1-2 sets - 10 reps - 5 sec hold - Supine Piriformis Stretch with Foot on Ground  - 1 x daily - 7 x weekly - 3 reps - 30 sec hold    ASSESSMENT:  CLINICAL IMPRESSION: Patient has been seen for total of 12 sessions from 01/11/24 to 02/25/24. Patient has made significant progress in lumbar/core stability and her overall reporting of her pain. She is reporting centralization of her symptoms and reporting no pain radiation below her L hip. She is making steady progress towards her long term goals and will benefit from continued skilled  PT to work on improving core/lumbar and hip stability to reach her maximum potential for long term pain relief and chronic pain management    OBJECTIVE IMPAIRMENTS: decreased activity tolerance, decreased endurance, decreased mobility, difficulty walking, decreased strength, hypomobility, increased muscle spasms, impaired flexibility, postural dysfunction, and pain.   ACTIVITY LIMITATIONS: carrying, lifting, bending, standing, squatting, and sleeping  PARTICIPATION LIMITATIONS: meal prep, cleaning, laundry, shopping, and community activity  PERSONAL FACTORS: Time since onset of injury/illness/exacerbation and 1-2 comorbidities: C4-6 ACDF, L TKR 4/24 are also affecting patient's functional outcome.   REHAB POTENTIAL: Good  CLINICAL DECISION MAKING: Stable/uncomplicated  EVALUATION COMPLEXITY: Low   GOALS: Goals reviewed with patient? Yes  SHORT TERM GOALS: Target date: 02/04/2024  Pt will be 50% compliant with HEP to self manage her symptoms.  Baseline: Goal status: MET   LONG TERM GOALS: Target date:  04/07/2024     Patient will demo 15 points improvement on Modified Oswestery test to improve overall function.  Baseline: 28% (01/11/24); 24% (8 points) (02/16/24) Goal status: progressing continue  2.  Pt will be able to perform 5x/ sit to stand in <18 seconds to improve functional strength.  Baseline: 28 sec (01/11/24); 19 sec (02/16/24) Goal status: progressing continue  3.  Patient will report pain at worst of >6/10 at to improve self management of pain. Baseline: 9/10 (01/11/24); 3-4/10 (02/04/24) Goal status: Goal met   4.  Pt will be 100% compliant with HEP to self manage her symptoms to improve self management.  Baseline: Issued today. Goal status: INITIAL   PLAN:  PT FREQUENCY: 2x/week  PT DURATION: 6 weeks  PLANNED INTERVENTIONS: 97164- PT Re-evaluation, 97110-Therapeutic exercises, 97530- Therapeutic activity, 97112- Neuromuscular re-education, 97535- Self Care, 40981- Manual therapy, [phone]- Gait training, [phone]- Electrical stimulation (unattended), Patient/Family education, Balance training, Stair training, Dry Needling, Joint manipulation, Spinal mobilization, Cryotherapy, and Moist heat.  PLAN FOR NEXT SESSION: Review ODI score, review HEP, practice steps, manual therapy to back, assess lumbar AROM, core strengthening and stability, hip abd weakness?   Kristine Phalen, PT   02/25/2024, 11:35 AM

## 2024-03-01 ENCOUNTER — Ambulatory Visit

## 2024-03-01 DIAGNOSIS — M5459 Other low back pain: Secondary | ICD-10-CM | POA: Diagnosis not present

## 2024-03-01 NOTE — Therapy (Signed)
 OUTPATIENT PHYSICAL THERAPY THORACOLUMBAR TREATMENT NOTE   Patient Name: Holly Beasley MRN: [id number] DOB:1951/10/08, 73 y.o., female Today's Date: 03/01/2024  END OF SESSION:  PT End of Session - 03/01/24 1145     Visit Number 14    Number of Visits 19    Date for PT Re-Evaluation 04/07/24    Authorization Type [employer]    PT Start Time 1110    PT Stop Time 1155    PT Time Calculation (min) 45 min    Equipment Utilized During Treatment Gait belt    Activity Tolerance Patient tolerated treatment well    Behavior During Therapy WFL for tasks assessed/performed               Past Medical History:  Diagnosis Date   Allergy    seasonal,take shots   Cataract    bilateral,removed 2019   Depression    Diabetes mellitus without complication (HCC)    Dysrhythmia    SVTs   GERD (gastroesophageal reflux disease)    History of ETOH abuse    recovering  in remission   Hyperlipidemia    Hypertension    Osteoarthritis    Osteopenia    dexa -1.7 h -1.35    Scoliosis    mild scoliosis, leg length discrepancy   Skin cancer    basal cell nose   Sleep apnea    did not meet criteria for cpap-has mouthpiece not using per pt   Substance abuse (HCC)    etoh 40 years ago   SVT (supraventricular tachycardia) (HCC) 08/15/2016   Varicose veins    Wears contact lenses    Past Surgical History:  Procedure Laterality Date   ANTERIOR FUSION CERVICAL SPINE     [phone]   BREAST BIOPSY     2003-rt   CATARACT EXTRACTION, BILATERAL  2019   COLONOSCOPY W/ POLYPECTOMY  6433,2951, 11-2022   precancerous   2004   DENTAL SURGERY  2015   bone graft    KNEE ARTHROSCOPY WITH LATERAL MENISECTOMY Left 11/10/2014   Procedure: LEFT KNEE ARTHROSCOPY PARTIAL LATERAL MENISECTOMY CHONDROPLASTY;  Surgeon: Alphonzo Ask, MD;  Location: [facility];  Service: Orthopedics;  Laterality: Left;   NASAL SINUS SURGERY  11/13/2008   polyps removed   skin cancer removal     basal cell  nose 2004   TONSILLECTOMY AND ADENOIDECTOMY  1960   TOTAL KNEE ARTHROPLASTY Left 02/03/2023   Procedure: LEFT TOTAL KNEE ARTHROPLASTY;  Surgeon: Dayne Even, MD;  Location: [facility];  Service: Orthopedics;  Laterality: Left;   VENOUS ABLATION Left 2015   Dr Timm Foot   Patient Active Problem List   Diagnosis Date Noted   Primary osteoarthritis of left knee 02/03/2023   HLD (hyperlipidemia) 04/14/2016   Essential hypertension 12/18/2014   Medication management 12/18/2014   Varicose veins of leg with complications 10/09/2014   Left knee pain 09/21/2014   Tear of meniscus of knee 09/21/2014   Hx of bacterial sinusitis 08/28/2014   Knee pain, acute 06/26/2014   Leg edema, left 06/20/2014   Varicose veins of bilateral lower extremities with other complications 06/20/2014   Varicose veins 05/03/2014   Inguinal adenopathy by ultrasound doppler 05/03/2014   Left leg swelling 04/25/2014   Diabetes mellitus type 2, controlled, without complications (HCC) 04/25/2014   Visit for preventive health examination 06/07/2012   Foot dermatitis 06/07/2012   Shortness of breath 11/06/2011   Nausea 11/06/2011   Hand eczema 06/27/2011   Prediabetes 03/20/2011  Preventative health care 03/20/2011   Dry eyes    Headache 12/30/2010   Scoliosis    Osteopenia    History of ETOH abuse    Sinusitis, chronic 02/27/2010   LIBIDO, DECREASED 02/27/2010   GERD 02/20/2009   CONSTIPATION, CHRONIC 02/20/2009   SKIN CANCER, HX OF 02/20/2009   OSA (obstructive sleep apnea) 03/21/2008   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 Daphine Eagle  REFERRING PROVIDER: Vickii Grand, PA-C  REFERRING DIAG: spondylolisthesis  Rationale for Evaluation and Treatment: Rehabilitation  THERAPY DIAG:  Other low back pain  ONSET DATE: 01/01/2024- date of referral  SUBJECTIVE:                                                                                                                                                                                           SUBJECTIVE STATEMENT: Pt reports she has been able to sleep her in bed for past 3 days. Current pain is about 3/10. No radiation of pain in below her L gluts in past 3 days.   PERTINENT HISTORY:  L TKA 02/03/23. PMH: SVT, ACDF,DM  PAIN:  Are you having pain? Yes: NPRS scale: 3/10 Pain location: back pain travels down her LLE Pain description: sharp, radiating Aggravating factors: Standing for prolonged periods of time; walking, getting up after sitting for a long time. Relieving factors: sitting, lying  PRECAUTIONS: None  RED FLAGS: None   WEIGHT BEARING RESTRICTIONS: No  FALLS:  Has patient fallen in last 6 months? No  PLOF: Independent  PATIENT GOALS: manage back pain    OBJECTIVE:  Note: Objective measures were completed at Evaluation unless otherwise noted.  DIAGNOSTIC FINDINGS:  11/28/23 MRI of lumbar spine IMPRESSION: 1. Severe L4-5 facet arthrosis with grade 1 anterolisthesis, severe spinal stenosis, and moderate bilateral neural foraminal stenosis. 2. Mild spinal stenosis and moderate left neural foraminal stenosis at L3-4.  PATIENT SURVEYS:  Modified Oswestry 14/50 = 28% disability   COGNITION: Overall cognitive status: Within functional limits for tasks assessed       LUMBAR ROM:   AROM eval  Flexion   Extension   Right lateral  flexion   Left lateral flexion   Right rotation    Left rotation    (Blank rows = not tested)  LOWER EXTREMITY ROM:     Active  Right eval Left eval  Hip flexion    Hip extension    Hip abduction    Hip adduction    Hip internal rotation    Hip external rotation    Knee flexion    Knee extension    Ankle dorsiflexion    Ankle plantarflexion    Ankle inversion    Ankle eversion     (Blank rows = not tested)  LOWER EXTREMITY MMT:    MMT Right eval Left eval  Hip flexion 5 5  Hip extension    Hip abduction 5 5  Hip adduction 5 5  Hip internal rotation    Hip external rotation    Knee flexion 5 5  Knee extension 5 5  Ankle dorsiflexion 5 5  Ankle plantarflexion    Ankle inversion    Ankle eversion     (Blank rows = not tested)  FUNCTIONAL TESTS:  5 times sit to stand: 28 sec without UE support  TREATMENT:   Myofascial release to lateral quadratus lumborum porder, posterior iliac crest on bil Passive left hip flexor stretch in R sidelying  SL hip abduction: 3 x 10 R and L, with leg dropping off EOB to improve full stretch in quadratus lumborum SL clamshells: 3 x 10 R and L, 3lbs at knee Supine bridge: 3 x 10 R and L Supine ab bracing with leg lifts: 3 lb ankle weights (1 cuff) placed at her bil ankles: 3 x 8 Lower trunk rotations: 15x  PATIENT EDUCATION:  Education details: continue HEP, added to HEP Person educated: Patient Education method: Programmer, multimedia, Facilities manager, and Handouts Education comprehension: verbalized understanding and returned demonstration  HOME EXERCISE PROGRAM: Access Code: [email or web address] URL: [email or web address] Date: 02/11/2024 Prepared by: Susanna Epley  Exercises - Sit to Stand  - 1 x daily - 7 x weekly - 10 reps - Supine Hip External Rotation  - 2 x daily - 7 x weekly - 2-3 reps - 30 sec hold - Supine March  - 2 x daily - 7 x weekly - 2 sets - 10 reps - Supine Heel Slide  - 1 x daily - 7 x weekly - 2 sets - 10 reps - Straight Leg Raise  - 1 x daily - 7 x weekly -  2 sets - 10 reps - Seated Slump Nerve Glide  - 3 x daily - 7 x weekly - 15 reps - Seated Hamstring Curl with Anchored Resistance  - 1 x daily - 7 x weekly - 2 sets - 10 reps - Seated Flexion Stretch  - 1-2 x daily - 7 x weekly - 10 reps - 10 sec hold - Standing Lumbar Extension with Counter  - 1-2 x daily - 7 x weekly - 10 reps - Supine 90/90 Sciatic Nerve Glide with Knee Flexion/Extension  - 1 x daily - 7 x weekly - 1-2 sets - 10 reps - 5 sec hold - Supine Piriformis Stretch with Foot on Ground  - 1 x daily - 7 x weekly - 3 reps - 30 sec hold    ASSESSMENT:  CLINICAL IMPRESSION: Overall reporting decreasing intensity and frequency of radicular symptoms in L hip. Overall slowly reporting centralizing of   OBJECTIVE IMPAIRMENTS: decreased activity tolerance, decreased endurance, decreased mobility, difficulty walking, decreased strength, hypomobility, increased muscle spasms,  impaired flexibility, postural dysfunction, and pain.   ACTIVITY LIMITATIONS: carrying, lifting, bending, standing, squatting, and sleeping  PARTICIPATION LIMITATIONS: meal prep, cleaning, laundry, shopping, and community activity  PERSONAL FACTORS: Time since onset of injury/illness/exacerbation and 1-2 comorbidities: C4-6 ACDF, L TKR 4/24 are also affecting patient's functional outcome.   REHAB POTENTIAL: Good  CLINICAL DECISION MAKING: Stable/uncomplicated  EVALUATION COMPLEXITY: Low   GOALS: Goals reviewed with patient? Yes  SHORT TERM GOALS: Target date: 02/04/2024  Pt will be 50% compliant with HEP to self manage her symptoms.  Baseline: Goal status: MET   LONG TERM GOALS: Target date: 04/07/2024     Patient will demo 15 points improvement on Modified Oswestery test to improve overall function.  Baseline: 28% (01/11/24); 24% (8 points) (02/16/24) Goal status: progressing continue  2.  Pt will be able to perform 5x/ sit to stand in <18 seconds to improve functional strength.  Baseline: 28 sec  (01/11/24); 19 sec (02/16/24) Goal status: progressing continue  3.  Patient will report pain at worst of >6/10 at to improve self management of pain. Baseline: 9/10 (01/11/24); 3-4/10 (02/04/24) Goal status: Goal met   4.  Pt will be 100% compliant with HEP to self manage her symptoms to improve self management.  Baseline: Issued today. Goal status: INITIAL   PLAN:  PT FREQUENCY: 2x/week  PT DURATION: 6 weeks  PLANNED INTERVENTIONS: 97164- PT Re-evaluation, 97110-Therapeutic exercises, 97530- Therapeutic activity, 97112- Neuromuscular re-education, 97535- Self Care, 16109- Manual therapy, [phone]- Gait training, [phone]- Electrical stimulation (unattended), Patient/Family education, Balance training, Stair training, Dry Needling, Joint manipulation, Spinal mobilization, Cryotherapy, and Moist heat.  PLAN FOR NEXT SESSION: Review ODI score, review HEP, practice steps, manual therapy to back, assess lumbar AROM, core strengthening and stability, hip abd weakness?   Kristine Phalen, PT   03/01/2024, 11:47 AM

## 2024-03-02 ENCOUNTER — Ambulatory Visit: Admitting: Internal Medicine

## 2024-03-02 ENCOUNTER — Other Ambulatory Visit: Payer: Self-pay | Admitting: Internal Medicine

## 2024-03-02 ENCOUNTER — Encounter: Payer: Self-pay | Admitting: Internal Medicine

## 2024-03-02 VITALS — BP 148/76 | HR 71 | Temp 97.4°F | Ht 68.5 in | Wt 169.6 lb

## 2024-03-02 DIAGNOSIS — Z85820 Personal history of malignant melanoma of skin: Secondary | ICD-10-CM

## 2024-03-02 DIAGNOSIS — E785 Hyperlipidemia, unspecified: Secondary | ICD-10-CM

## 2024-03-02 DIAGNOSIS — Z79899 Other long term (current) drug therapy: Secondary | ICD-10-CM | POA: Diagnosis not present

## 2024-03-02 DIAGNOSIS — E1165 Type 2 diabetes mellitus with hyperglycemia: Secondary | ICD-10-CM

## 2024-03-02 DIAGNOSIS — I10 Essential (primary) hypertension: Secondary | ICD-10-CM | POA: Diagnosis not present

## 2024-03-02 DIAGNOSIS — D649 Anemia, unspecified: Secondary | ICD-10-CM

## 2024-03-02 LAB — POCT GLYCOSYLATED HEMOGLOBIN (HGB A1C): Hemoglobin A1C: 6.8 % — AB (ref 4.0–5.6)

## 2024-03-02 MED ORDER — LOSARTAN POTASSIUM 50 MG PO TABS: ORAL_TABLET | ORAL | 3 refills | Status: DC

## 2024-03-02 NOTE — Progress Notes (Signed)
 Chief Complaint  Patient presents with   Medical Management of Chronic Issues    Pt is here for her follow up. 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     HPI: Holly Beasley 73 y.o. come in for Chronic disease management  DM: up dose of  farxiga   last visit  eating less  less appetite but no se of med reported . 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 Has signification back predicament  and to do surgery in near future  for L4-5 pinched nerve.  Other options haven't worked  Left side  and to hip  spondylethsis  pain  getting PT  HT  we had dec to losartan  50 in past but  readings higher today  ROS: See pertinent positives and negatives per HPI. Husband cognitive decline and hearing but doesn use his hearing aids  Past Medical History:  Diagnosis Date   Allergy    seasonal,take shots   Cataract    bilateral,removed 2019   Depression    Diabetes mellitus without complication (HCC)    Dysrhythmia    SVTs   GERD (gastroesophageal reflux disease)    History of ETOH abuse    recovering  in remission   Hyperlipidemia    Hypertension    Osteoarthritis    Osteopenia    dexa -1.7 h -1.35    Scoliosis    mild scoliosis, leg length discrepancy   Skin cancer    basal cell nose   Sleep apnea    did not meet criteria for cpap-has mouthpiece not using per pt   Substance abuse (HCC)    etoh 40 years ago   SVT (supraventricular tachycardia) (HCC) 08/15/2016   Varicose veins    Wears contact lenses     Family History  Problem Relation Age of Onset   Lymphoma Mother    Diabetes Mother    Hyperlipidemia Mother    Colon polyps Mother    Heart failure Father    Hyperlipidemia Father    Arthritis Brother    Alcohol abuse Brother    Alcohol abuse Brother    Colon cancer Neg Hx    Esophageal cancer Neg Hx    Rectal cancer Neg Hx    Stomach cancer Neg Hx    Crohn's disease Neg Hx    Ulcerative colitis Neg Hx    BRCA 1/2  Neg Hx    Breast cancer Neg Hx     Social History   Socioeconomic History   Marital status: Married    Spouse name: Not on file   Number of children: Not on file   Years of education: Not on file   Highest education level: Not on file  Occupational History   Not on file  Tobacco Use   Smoking status: Never   Smokeless tobacco: Never  Vaping Use   Vaping status: Never Used  Substance and Sexual Activity   Alcohol use: No    Alcohol/week: 0.0 standard drinks of alcohol    Comment: recovering-1983   Drug use: No   Sexual activity: Not Currently  Other Topics Concern   Not on file  Social History Narrative   2 cats   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    Retired Equities trader with [employer]   Married   ETOH recovering   Hhof 2    Has  family in [place]         Social Drivers of Health   Financial Resource Strain: Low Risk  (03/02/2024)   Received from [facility]   Overall Financial Resource Strain (CARDIA)    Difficulty of Paying Living Expenses: Not hard at all  Food Insecurity: No Food Insecurity (03/02/2024)   Received from [facility]   Hunger Vital Sign    Worried About Running Out of Food in the Last Year: Never true    Ran Out of Food in the Last Year: Never true  Transportation Needs: No Transportation Needs (03/02/2024)   Received from [facility] - Transportation    Lack of Transportation (Medical): No    Lack of Transportation (Non-Medical): No  Physical Activity: Not on file  Stress: Not on file  Social Connections: Not on file    Outpatient Medications Prior to Visit  Medication Sig Dispense Refill   atorvastatin  (LIPITOR) 40 MG tablet TAKE 1 TABLET BY MOUTH EVERY DAY 90 tablet 0   azelastine (ASTELIN) 0.1 % nasal spray Place 2 sprays into both nostrils in the morning.     carvedilol  (COREG ) 12.5 MG tablet Take 1 tablet (12.5 mg total) by mouth 2 (two) times daily. 180 tablet 3    cetirizine (ZYRTEC) 10 MG tablet Take 10 mg by mouth at bedtime.     Cholecalciferol (VITAMIN D3) 25 MCG (1000 UT) capsule Take 1,000 Units by mouth daily.     Cyanocobalamin  (B-12) 1000 MCG TABS Take 1,000 mcg by mouth daily.     dapagliflozin  propanediol (FARXIGA ) 10 MG TABS tablet Take 1 tablet (10 mg total) by mouth daily before breakfast. Dosage change 90 tablet 0   DULoxetine  (CYMBALTA ) 60 MG capsule Take 2 capsules (120 mg total) by mouth at bedtime. 180 capsule 1   EPINEPHrine  0.3 mg/0.3 mL IJ SOAJ injection Inject 0.3 mg into the muscle as needed for anaphylaxis.     HYDROcodone -acetaminophen  (NORCO) 10-325 MG tablet Take 1 tablet by mouth every 6 (six) hours as needed.     Magnesium 400 MG TABS Take 400 mg by mouth daily.     metFORMIN  (GLUCOPHAGE ) 500 MG tablet TAKE 4 TABLETS BY MOUTH EVERY DAY WITH A MEAL 360 tablet 1   montelukast  (SINGULAIR ) 10 MG tablet TAKE 1 TABLET BY MOUTH EVERYDAY AT BEDTIME 90 tablet 0   Multiple Vitamins-Minerals (MULTIVITAMIN GUMMIES ADULTS PO) Take by mouth daily. Take 2 daily     omeprazole  (PRILOSEC) 40 MG capsule TAKE 1 CAPSULE BY MOUTH EVERY DAY 90 capsule 0   losartan  (COZAAR ) 50 MG tablet Take 1 tablet (50 mg total) by mouth daily. 90 tablet 3   No facility-administered medications prior to visit.     EXAM:  BP (!) 148/76 (BP Location: Right Arm, Patient Position: Sitting, Cuff Size: Normal)   Pulse 71   Temp (!) 97.4 F (36.3 C) (Oral)   Ht 5' 8.5" (1.74 m)   Wt 169 lb 9.6 oz (76.9 kg)   SpO2 98%   BMI 25.41 kg/m   Body mass index is 25.41 kg/m.  GENERAL: vitals reviewed and listed above, alert, oriented, appears well hydrated and in no acute distress walks with cane "in case"  HEENT: atraumatic, conjunctiva  clear, no obvious abnormalities on inspection of external nose and ears well healed scar left face  NECK: no obvious masses on inspection palpation  LUNGS: clear to auscultation bilaterally, no wheezes, rales or rhonchi, good air  movement CV: HRRR, no  clubbing cyanosis left more then right old trc to 1  peripheral edema nl cap refill  MS: moves all extremities without noticeable focal  abnormality PSYCH: pleasant and cooperative, no obvious depression or anxiety Lab Results  Component Value Date   WBC 7.4 04/14/2023   HGB 11.1 (L) 04/14/2023   HCT 34.4 (L) 04/14/2023   PLT 395.0 04/14/2023   GLUCOSE 195 (H) 10/22/2023   CHOL 163 04/14/2023   TRIG 83.0 04/14/2023   HDL 57.20 04/14/2023   LDLDIRECT 171.0 10/10/2016   LDLCALC 89 04/14/2023   ALT 17 04/14/2023   AST 17 04/14/2023   NA 139 10/22/2023   K 4.3 10/22/2023   CL 104 10/22/2023   CREATININE 0.83 10/22/2023   BUN 13 10/22/2023   CO2 28 10/22/2023   TSH 1.40 10/22/2023   HGBA1C 6.8 (A) 03/02/2024   MICROALBUR <0.7 04/14/2023   BP Readings from Last 3 Encounters:  03/02/24 (!) 148/76  10/22/23 (!) 148/80  06/11/23 130/62    ASSESSMENT AND PLAN:  Discussed the following assessment and plan:  Type 2 diabetes mellitus with hyperglycemia, without long-term current use of insulin (HCC) - Plan: POC HgB A1c  Essential hypertension  Medication management  Hyperlipidemia, unspecified hyperlipidemia type  History of melanoma - face HT : BP creeping up?  Go back up to 100 mg losartan  per day   ( 50 x 2) and send in readings   if controlled and ok then we can send in rx for losartan  100 mg per day  DM  better A1c  continue caution  when dehydrated . Back disease  planning surgical intervention  -Patient advised to return or notify health care team  if  new concerns arise.  Patient Instructions  Good to see you  today  Hg A1c is 6.8  much better   same meds and effects.   Try increasing  losartan  to 100 mg for now and send in readings   if ok we can send in rx for 100 mg per day.   Plan  fu 3-4  months   I will place future orders  to get before next visit  ti include lipids thyroid  chemistry blood coutn and A1c  and urine for protein.    Lamar Meter K. Toriann Spadoni M.D.

## 2024-03-02 NOTE — Patient Instructions (Addendum)
 Good to see you  today  Hg A1c is 6.8  much better   same meds and effects.   Try increasing  losartan  to 100 mg for now and send in readings   if ok we can send in rx for 100 mg per day.   Plan  fu 3-4  months   I will place future orders  to get before next visit  ti include lipids thyroid  chemistry blood coutn and A1c  and urine for protein.

## 2024-03-03 ENCOUNTER — Ambulatory Visit

## 2024-03-03 DIAGNOSIS — M5459 Other low back pain: Secondary | ICD-10-CM | POA: Diagnosis not present

## 2024-03-03 NOTE — Therapy (Signed)
 OUTPATIENT PHYSICAL THERAPY THORACOLUMBAR TREATMENT NOTE   Patient Name: GWYNN CHALKER MRN: [id number] DOB:1951/03/01, 73 y.o., female Today's Date: 03/03/2024  END OF SESSION:  PT End of Session - 03/03/24 1228     Visit Number 15    Number of Visits 19    Date for PT Re-Evaluation 04/07/24    Authorization Type [employer]    PT Start Time 1230    PT Stop Time 1315    PT Time Calculation (min) 45 min    Equipment Utilized During Treatment Gait belt    Activity Tolerance Patient tolerated treatment well    Behavior During Therapy WFL for tasks assessed/performed               Past Medical History:  Diagnosis Date   Allergy    seasonal,take shots   Cataract    bilateral,removed 2019   Depression    Diabetes mellitus without complication (HCC)    Dysrhythmia    SVTs   GERD (gastroesophageal reflux disease)    History of ETOH abuse    recovering  in remission   Hyperlipidemia    Hypertension    Osteoarthritis    Osteopenia    dexa -1.7 h -1.35    Scoliosis    mild scoliosis, leg length discrepancy   Skin cancer    basal cell nose   Sleep apnea    did not meet criteria for cpap-has mouthpiece not using per pt   Substance abuse (HCC)    etoh 40 years ago   SVT (supraventricular tachycardia) (HCC) 08/15/2016   Varicose veins    Wears contact lenses    Past Surgical History:  Procedure Laterality Date   ANTERIOR FUSION CERVICAL SPINE     [phone]   BREAST BIOPSY     2003-rt   CATARACT EXTRACTION, BILATERAL  2019   COLONOSCOPY W/ POLYPECTOMY  4782,9562, 11-2022   precancerous   2004   DENTAL SURGERY  2015   bone graft    KNEE ARTHROSCOPY WITH LATERAL MENISECTOMY Left 11/10/2014   Procedure: LEFT KNEE ARTHROSCOPY PARTIAL LATERAL MENISECTOMY CHONDROPLASTY;  Surgeon: Alphonzo Ask, MD;  Location: [facility];  Service: Orthopedics;  Laterality: Left;   NASAL SINUS SURGERY  11/13/2008   polyps removed   skin cancer removal     basal cell  nose 2004   TONSILLECTOMY AND ADENOIDECTOMY  1960   TOTAL KNEE ARTHROPLASTY Left 02/03/2023   Procedure: LEFT TOTAL KNEE ARTHROPLASTY;  Surgeon: Dayne Even, MD;  Location: [facility];  Service: Orthopedics;  Laterality: Left;   VENOUS ABLATION Left 2015   Dr Timm Foot   Patient Active Problem List   Diagnosis Date Noted   Primary osteoarthritis of left knee 02/03/2023   HLD (hyperlipidemia) 04/14/2016   Essential hypertension 12/18/2014   Medication management 12/18/2014   Varicose veins of leg with complications 10/09/2014   Left knee pain 09/21/2014   Tear of meniscus of knee 09/21/2014   Hx of bacterial sinusitis 08/28/2014   Knee pain, acute 06/26/2014   Leg edema, left 06/20/2014   Varicose veins of bilateral lower extremities with other complications 06/20/2014   Varicose veins 05/03/2014   Inguinal adenopathy by ultrasound doppler 05/03/2014   Left leg swelling 04/25/2014   Diabetes mellitus type 2, controlled, without complications (HCC) 04/25/2014   Visit for preventive health examination 06/07/2012   Foot dermatitis 06/07/2012   Shortness of breath 11/06/2011   Nausea 11/06/2011   Hand eczema 06/27/2011   Prediabetes 03/20/2011  Preventative health care 03/20/2011   Dry eyes    Headache 12/30/2010   Scoliosis    Osteopenia    History of ETOH abuse    Sinusitis, chronic 02/27/2010   LIBIDO, DECREASED 02/27/2010   GERD 02/20/2009   CONSTIPATION, CHRONIC 02/20/2009   SKIN CANCER, HX OF 02/20/2009   OSA (obstructive sleep apnea) 03/21/2008   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 Daphine Eagle  REFERRING PROVIDER: Vickii Grand, PA-C  REFERRING DIAG: spondylolisthesis  Rationale for Evaluation and Treatment: Rehabilitation  THERAPY DIAG:  Other low back pain  ONSET DATE: 01/01/2024- date of referral  SUBJECTIVE:                                                                                                                                                                                           SUBJECTIVE STATEMENT: Pt reports she went to see surgeon and she is ready to have the surgery in the back in may be 1.5 depending on the approval. Pt reports she had little pain in left hip this morning.   PERTINENT HISTORY:  L TKA 02/03/23. PMH: SVT, ACDF,DM  PAIN:  Are you having pain? Yes: NPRS scale: 3/10 Pain location: back pain travels down her LLE Pain description: sharp, radiating Aggravating factors: Standing for prolonged periods of time; walking, getting up after sitting for a long time. Relieving factors: sitting, lying  PRECAUTIONS: None  RED FLAGS: None   WEIGHT BEARING RESTRICTIONS: No  FALLS:  Has patient fallen in last 6 months? No  PLOF: Independent  PATIENT GOALS: manage back pain    OBJECTIVE:  Note: Objective measures were completed at Evaluation unless otherwise noted.  DIAGNOSTIC FINDINGS:  11/28/23 MRI of lumbar spine IMPRESSION: 1. Severe L4-5 facet arthrosis with grade 1 anterolisthesis, severe spinal stenosis, and moderate bilateral neural foraminal stenosis. 2. Mild spinal stenosis and moderate left neural foraminal stenosis at L3-4.  PATIENT SURVEYS:  Modified Oswestry 14/50 = 28% disability   COGNITION: Overall cognitive status: Within functional limits for tasks assessed       LUMBAR ROM:   AROM eval  Flexion   Extension  Right lateral flexion   Left  lateral flexion   Right rotation   Left rotation    (Blank rows = not tested)  LOWER EXTREMITY ROM:     Active  Right eval Left eval  Hip flexion    Hip extension    Hip abduction    Hip adduction    Hip internal rotation    Hip external rotation    Knee flexion    Knee extension    Ankle dorsiflexion    Ankle plantarflexion    Ankle inversion    Ankle eversion     (Blank rows = not tested)  LOWER EXTREMITY MMT:    MMT Right eval Left eval  Hip flexion 5 5  Hip extension    Hip abduction 5 5  Hip adduction 5 5  Hip internal rotation    Hip external rotation    Knee flexion 5 5  Knee extension 5 5  Ankle dorsiflexion 5 5  Ankle plantarflexion    Ankle inversion    Ankle eversion     (Blank rows = not tested)  FUNCTIONAL TESTS:  5 times sit to stand: 28 sec without UE support  TREATMENT:   Myofascial release to lateral quadratus lumborum porder, posterior iliac crest on bil, sacral sulcus region on L Passive left hip flexor stretch in R sidelying Passive quadratus lumborum stretch in R sidelying for left QL  PATIENT EDUCATION:  Education details: continue HEP, added to HEP Person educated: Patient Education method: Programmer, multimedia, Facilities manager, and Handouts Education comprehension: verbalized understanding and returned demonstration  HOME EXERCISE PROGRAM: Access Code: [email or web address] URL: [email or web address] Date: 02/11/2024 Prepared by: Susanna Epley  Exercises - Sit to Stand  - 1 x daily - 7 x weekly - 10 reps - Supine Hip External Rotation  - 2 x daily - 7 x weekly - 2-3 reps - 30 sec hold - Supine March  - 2 x daily - 7 x weekly - 2 sets - 10 reps - Supine Heel Slide  - 1 x daily - 7 x weekly - 2 sets - 10 reps - Straight Leg Raise  - 1 x daily - 7 x weekly - 2 sets - 10 reps - Seated Slump Nerve Glide  - 3 x daily - 7 x weekly - 15 reps - Seated Hamstring Curl with Anchored Resistance  - 1 x daily - 7 x weekly - 2 sets - 10 reps -  Seated Flexion Stretch  - 1-2 x daily - 7 x weekly - 10 reps - 10 sec hold - Standing Lumbar Extension with Counter  - 1-2 x daily - 7 x weekly - 10 reps - Supine 90/90 Sciatic Nerve Glide with Knee Flexion/Extension  - 1 x daily - 7 x weekly - 1-2 sets - 10 reps - 5 sec hold - Supine Piriformis Stretch with Foot on Ground  - 1 x daily - 7 x weekly - 3 reps - 30 sec hold    ASSESSMENT:  CLINICAL IMPRESSION: Myofascial restrictions are gradually improving in lumbar spine and patient is reporting steady reports of pain at 3/10 intermittently.    OBJECTIVE IMPAIRMENTS: decreased activity tolerance, decreased endurance, decreased mobility, difficulty walking, decreased strength, hypomobility, increased muscle spasms, impaired flexibility, postural dysfunction, and pain.   ACTIVITY LIMITATIONS: carrying, lifting, bending, standing, squatting, and sleeping  PARTICIPATION LIMITATIONS: meal prep, cleaning, laundry, shopping, and community activity  PERSONAL FACTORS: Time since onset of injury/illness/exacerbation and 1-2 comorbidities: C4-6 ACDF, L TKR 4/24 are  also affecting patient's functional outcome.   REHAB POTENTIAL: Good  CLINICAL DECISION MAKING: Stable/uncomplicated  EVALUATION COMPLEXITY: Low   GOALS: Goals reviewed with patient? Yes  SHORT TERM GOALS: Target date: 02/04/2024  Pt will be 50% compliant with HEP to self manage her symptoms.  Baseline: Goal status: MET   LONG TERM GOALS: Target date: 04/07/2024     Patient will demo 15 points improvement on Modified Oswestery test to improve overall function.  Baseline: 28% (01/11/24); 24% (8 points) (02/16/24) Goal status: progressing continue  2.  Pt will be able to perform 5x/ sit to stand in <18 seconds to improve functional strength.  Baseline: 28 sec (01/11/24); 19 sec (02/16/24) Goal status: progressing continue  3.  Patient will report pain at worst of >6/10 at to improve self management of pain. Baseline: 9/10  (01/11/24); 3-4/10 (02/04/24) Goal status: Goal met   4.  Pt will be 100% compliant with HEP to self manage her symptoms to improve self management.  Baseline: Issued today. Goal status: INITIAL   PLAN:  PT FREQUENCY: 2x/week  PT DURATION: 6 weeks  PLANNED INTERVENTIONS: 97164- PT Re-evaluation, 97110-Therapeutic exercises, 97530- Therapeutic activity, 97112- Neuromuscular re-education, 97535- Self Care, 16109- Manual therapy, [phone]- Gait training, [phone]- Electrical stimulation (unattended), Patient/Family education, Balance training, Stair training, Dry Needling, Joint manipulation, Spinal mobilization, Cryotherapy, and Moist heat.  PLAN FOR NEXT SESSION: Review ODI score, review HEP, practice steps, manual therapy to back, assess lumbar AROM, core strengthening and stability, hip abd weakness?   Kristine Phalen, PT   03/03/2024, 1:21 PM

## 2024-03-10 ENCOUNTER — Ambulatory Visit

## 2024-03-10 DIAGNOSIS — M5459 Other low back pain: Secondary | ICD-10-CM | POA: Diagnosis not present

## 2024-03-10 NOTE — Therapy (Signed)
 OUTPATIENT PHYSICAL THERAPY THORACOLUMBAR TREATMENT NOTE   Patient Name: Holly Beasley MRN: [id number] DOB:1951/09/17, 73 y.o., female Today's Date: 03/10/2024  END OF SESSION:  PT End of Session - 03/10/24 1104     Visit Number 16    Number of Visits 19    Date for PT Re-Evaluation 04/07/24    Authorization Type [employer]    PT Start Time 1100    PT Stop Time 1145    PT Time Calculation (min) 45 min    Equipment Utilized During Treatment Gait belt    Activity Tolerance Patient tolerated treatment well    Behavior During Therapy WFL for tasks assessed/performed               Past Medical History:  Diagnosis Date   Allergy    seasonal,take shots   Cataract    bilateral,removed 2019   Depression    Diabetes mellitus without complication (HCC)    Dysrhythmia    SVTs   GERD (gastroesophageal reflux disease)    History of ETOH abuse    recovering  in remission   Hyperlipidemia    Hypertension    Osteoarthritis    Osteopenia    dexa -1.7 h -1.35    Scoliosis    mild scoliosis, leg length discrepancy   Skin cancer    basal cell nose   Sleep apnea    did not meet criteria for cpap-has mouthpiece not using per pt   Substance abuse (HCC)    etoh 40 years ago   SVT (supraventricular tachycardia) (HCC) 08/15/2016   Varicose veins    Wears contact lenses    Past Surgical History:  Procedure Laterality Date   ANTERIOR FUSION CERVICAL SPINE     [phone]   BREAST BIOPSY     2003-rt   CATARACT EXTRACTION, BILATERAL  2019   COLONOSCOPY W/ POLYPECTOMY  3086,5784, 11-2022   precancerous   2004   DENTAL SURGERY  2015   bone graft    KNEE ARTHROSCOPY WITH LATERAL MENISECTOMY Left 11/10/2014   Procedure: LEFT KNEE ARTHROSCOPY PARTIAL LATERAL MENISECTOMY CHONDROPLASTY;  Surgeon: Alphonzo Ask, MD;  Location: [facility];  Service: Orthopedics;  Laterality: Left;   NASAL SINUS SURGERY  11/13/2008   polyps removed   skin cancer removal     basal cell  nose 2004   TONSILLECTOMY AND ADENOIDECTOMY  1960   TOTAL KNEE ARTHROPLASTY Left 02/03/2023   Procedure: LEFT TOTAL KNEE ARTHROPLASTY;  Surgeon: Dayne Even, MD;  Location: [facility];  Service: Orthopedics;  Laterality: Left;   VENOUS ABLATION Left 2015   Dr Timm Foot   Patient Active Problem List   Diagnosis Date Noted   Primary osteoarthritis of left knee 02/03/2023   HLD (hyperlipidemia) 04/14/2016   Essential hypertension 12/18/2014   Medication management 12/18/2014   Varicose veins of leg with complications 10/09/2014   Left knee pain 09/21/2014   Tear of meniscus of knee 09/21/2014   Hx of bacterial sinusitis 08/28/2014   Knee pain, acute 06/26/2014   Leg edema, left 06/20/2014   Varicose veins of bilateral lower extremities with other complications 06/20/2014   Varicose veins 05/03/2014   Inguinal adenopathy by ultrasound doppler 05/03/2014   Left leg swelling 04/25/2014   Diabetes mellitus type 2, controlled, without complications (HCC) 04/25/2014   Visit for preventive health examination 06/07/2012   Foot dermatitis 06/07/2012   Shortness of breath 11/06/2011   Nausea 11/06/2011   Hand eczema 06/27/2011   Prediabetes 03/20/2011  Preventative health care 03/20/2011   Dry eyes    Headache 12/30/2010   Scoliosis    Osteopenia    History of ETOH abuse    Sinusitis, chronic 02/27/2010   LIBIDO, DECREASED 02/27/2010   GERD 02/20/2009   CONSTIPATION, CHRONIC 02/20/2009   SKIN CANCER, HX OF 02/20/2009   OSA (obstructive sleep apnea) 03/21/2008   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 Daphine Eagle  REFERRING PROVIDER: Vickii Grand, PA-C  REFERRING DIAG: spondylolisthesis  Rationale for Evaluation and Treatment: Rehabilitation  THERAPY DIAG:  Other low back pain  ONSET DATE: 01/01/2024- date of referral  SUBJECTIVE:                                                                                                                                                                                           SUBJECTIVE STATEMENT: Mornings are tough but pain gets better as the day goes on. I have lumbar surgery scheduled in August of this year.  PERTINENT HISTORY:  L TKA 02/03/23. PMH: SVT, ACDF,DM  PAIN:  Are you having pain? Yes: NPRS scale: 3/10 Pain location: back pain travels down her LLE Pain description: sharp, radiating Aggravating factors: Standing for prolonged periods of time; walking, getting up after sitting for a long time. Relieving factors: sitting, lying  PRECAUTIONS: None  RED FLAGS: None   WEIGHT BEARING RESTRICTIONS: No  FALLS:  Has patient fallen in last 6 months? No  PLOF: Independent  PATIENT GOALS: manage back pain    OBJECTIVE:  Note: Objective measures were completed at Evaluation unless otherwise noted.  DIAGNOSTIC FINDINGS:  11/28/23 MRI of lumbar spine IMPRESSION: 1. Severe L4-5 facet arthrosis with grade 1 anterolisthesis, severe spinal stenosis, and moderate bilateral neural foraminal stenosis. 2. Mild spinal stenosis and moderate left neural foraminal stenosis at L3-4.  PATIENT SURVEYS:  Modified Oswestry 14/50 = 28% disability   COGNITION: Overall cognitive status: Within functional limits for tasks assessed       LUMBAR ROM:   AROM eval  Flexion   Extension   Right lateral flexion   Left lateral flexion   Right rotation  Left rotation    (Blank rows = not  tested)  LOWER EXTREMITY ROM:     Active  Right eval Left eval  Hip flexion    Hip extension    Hip abduction    Hip adduction    Hip internal rotation    Hip external rotation    Knee flexion    Knee extension    Ankle dorsiflexion    Ankle plantarflexion    Ankle inversion    Ankle eversion     (Blank rows = not tested)  LOWER EXTREMITY MMT:    MMT Right eval Left eval  Hip flexion 5 5  Hip extension    Hip abduction 5 5  Hip adduction 5 5  Hip internal rotation    Hip external rotation    Knee flexion 5 5  Knee extension 5 5  Ankle dorsiflexion 5 5  Ankle plantarflexion    Ankle inversion    Ankle eversion     (Blank rows = not tested)  FUNCTIONAL TESTS:  5 times sit to stand: 28 sec without UE support  TREATMENT:   Myofascial release to lateral quadratus lumborum porder, posterior iliac crest on bil, sacral sulcus region on L SL hip abduction: 3 x 10, 3lbs at anle R and L, with leg dropping off EOB to improve full stretch in quadratus lumborum SL clamshells: 3 x 15 R and L, 3lbs at knee Supine ab bracing with leg lifts: 3 lb ankle weights (1 cuff) placed at her bil ankles: 3 x 10 Lower trunk rotations: 15x Reviewed potential D/C next session.  PATIENT EDUCATION:  Education details: continue HEP, added to HEP Person educated: Patient Education method: Explanation, Demonstration, and Handouts Education comprehension: verbalized understanding and returned demonstration  HOME EXERCISE PROGRAM: Access Code: [email or web address] URL: [email or web address] Date: 02/11/2024 Prepared by: Susanna Epley  Exercises - Sit to Stand  - 1 x daily - 7 x weekly - 10 reps - Supine Hip External Rotation  - 2 x daily - 7 x weekly - 2-3 reps - 30 sec hold - Supine March  - 2 x daily - 7 x weekly - 2 sets - 10 reps - Supine Heel Slide  - 1 x daily - 7 x weekly - 2 sets - 10 reps - Straight Leg Raise  - 1 x daily - 7 x weekly - 2 sets - 10 reps - Seated Slump Nerve  Glide  - 3 x daily - 7 x weekly - 15 reps - Seated Hamstring Curl with Anchored Resistance  - 1 x daily - 7 x weekly - 2 sets - 10 reps - Seated Flexion Stretch  - 1-2 x daily - 7 x weekly - 10 reps - 10 sec hold - Standing Lumbar Extension with Counter  - 1-2 x daily - 7 x weekly - 10 reps - Supine 90/90 Sciatic Nerve Glide with Knee Flexion/Extension  - 1 x daily - 7 x weekly - 1-2 sets - 10 reps - 5 sec hold - Supine Piriformis Stretch with Foot on Ground  - 1 x daily - 7 x weekly - 3 reps - 30 sec hold    ASSESSMENT:  CLINICAL IMPRESSION: Pt tolerated session well. Emphasis on progression of her hip/core/lumbar strengthening. D/C next session.  OBJECTIVE IMPAIRMENTS: decreased activity tolerance, decreased endurance, decreased mobility, difficulty walking, decreased strength, hypomobility, increased muscle spasms, impaired flexibility, postural dysfunction, and pain.   ACTIVITY LIMITATIONS: carrying, lifting, bending, standing, squatting, and sleeping  PARTICIPATION LIMITATIONS:  meal prep, cleaning, laundry, shopping, and community activity  PERSONAL FACTORS: Time since onset of injury/illness/exacerbation and 1-2 comorbidities: C4-6 ACDF, L TKR 4/24 are also affecting patient's functional outcome.   REHAB POTENTIAL: Good  CLINICAL DECISION MAKING: Stable/uncomplicated  EVALUATION COMPLEXITY: Low   GOALS: Goals reviewed with patient? Yes  SHORT TERM GOALS: Target date: 02/04/2024  Pt will be 50% compliant with HEP to self manage her symptoms.  Baseline: Goal status: MET   LONG TERM GOALS: Target date: 04/07/2024     Patient will demo 15 points improvement on Modified Oswestery test to improve overall function.  Baseline: 28% (01/11/24); 24% (8 points) (02/16/24) Goal status: progressing continue  2.  Pt will be able to perform 5x/ sit to stand in <18 seconds to improve functional strength.  Baseline: 28 sec (01/11/24); 19 sec (02/16/24) Goal status: progressing  continue  3.  Patient will report pain at worst of >6/10 at to improve self management of pain. Baseline: 9/10 (01/11/24); 3-4/10 (02/04/24) Goal status: Goal met   4.  Pt will be 100% compliant with HEP to self manage her symptoms to improve self management.  Baseline: Issued today. Goal status: INITIAL   PLAN:  PT FREQUENCY: 2x/week  PT DURATION: 6 weeks  PLANNED INTERVENTIONS: 97164- PT Re-evaluation, 97110-Therapeutic exercises, 97530- Therapeutic activity, 97112- Neuromuscular re-education, 97535- Self Care, 16109- Manual therapy, [phone]- Gait training, [phone]- Electrical stimulation (unattended), Patient/Family education, Balance training, Stair training, Dry Needling, Joint manipulation, Spinal mobilization, Cryotherapy, and Moist heat.  PLAN FOR NEXT SESSION: Review ODI score, review HEP, practice steps, manual therapy to back, assess lumbar AROM, core strengthening and stability, hip abd weakness?   Kristine Phalen, PT   03/10/2024, 11:36 AM

## 2024-03-14 NOTE — Progress Notes (Deleted)
 Cardiology Office Note:    Date:  03/14/2024   ID:  Margie Sheller, DOB [birth year], MRN [id number]  PCP:  Reginal Capra, MD  Cardiologist:  None { Click to update primary MD,subspecialty MD or APP then REFRESH:1}    Referring MD: Reginal Capra, MD   Chief Complaint: pre-op evaluation  History of Present Illness:    Holly Beasley is a 73 y.o. female with a history of paroxysmal SVT, hypertension, hyperlipidemia, type 2 diabetes mellitus, GERD, and vertigo who presents today for pre-op evaluation.   Patient presented to the ED in 08/2016 for further evaluation of sudden onset of tachy-palpitations and was noted to be in SVT with rates in the 150s. She was given Adenosine and converted back to sinus rhythm. Echo in 09/2016 showed LVEF of 60-65% with grade 1 diastolic dysfunction, normal RV function, and no significant valvular disease. Myoview  in 09/2016 was low risk with no evidence of ischemia.  At visit in 02/2021, she noted progressive shortness of breath and fatigue. Therefore, repeat Echo and Myoview  were ordered for further evaluation. Echo showed LVEF of 65% with mild LVH, normal RV function, and no significant valvular disease. Myoview  was low risk with no evidence of ischemia.  She also has a history of vertigo and has been referred to ENT for this.  She was last seen by Mertha Abrahams, PA-C, in 05/2023 at which time she was doing very well from a cardiac standpoint.   Patient presents today for pre-op evaluation for upcoming spinal surgery. ***  Pre-Op Evaluation Patient has an upcoming spinal surgery scheduled for ***. She is doing well from a cardiac standpoint. No chest pain, shortness of breath, acute CHF symptoms, palpitations, or syncope. She is able to complete >4.0 METS of physical activity without any anginal symptoms. Per Revised Cardiac Risk Index, she has a 3.9% risk of major cardiac event perioperatively. Therefore, based on ACC/AHA guidelines, patient would be  at acceptable risk for the planned procedure without further cardiovascular testing. I will route this recommendation to the requesting party via Epic fax function. ***  History of SVT Patient had a single episode of SVT in 2017. No documented recurrence since then. - No significant palpitations. - Continue Coreg  12.5mg  twice daily.   Hypertension BP well controlled. *** - Continue current medications: Coreg  12.5mg  twice daily and Losartan  ***.  Hyperlipidemia Lipid panel in 04/2023: Total Cholesterol 163, Triglycerides 83, HDL 57.2, LDL 89. - Continue Lipitor 40mg  daily - Labs followed by PCP.  Type 2 Diabetes Mellitus Hemoglobin A1c 6.8% in 02/2024. - On Metformin  and Farxiga . - Management per PCP.  EKGs/Labs/Other Studies Reviewed:    The following studies were reviewed:  Myoview  02/21/2021: The left ventricular ejection fraction is normal (55-65%). Nuclear stress EF: 62%. There was no ST segment deviation noted during stress. The study is normal. This is a low risk study. _______________  Echocardiogram 03/14/2021: Impressions:  1. Left ventricular ejection fraction, by estimation, is 60 to 65%. The  left ventricle has normal function. The left ventricle has no regional  wall motion abnormalities. There is mild left ventricular hypertrophy.  Left ventricular diastolic parameters  were normal.   2. Right ventricular systolic function is normal. The right ventricular  size is normal. There is normal pulmonary artery systolic pressure.   3. The mitral valve is normal in structure. No evidence of mitral valve  regurgitation. No evidence of mitral stenosis.   4. The aortic valve is tricuspid. There is mild  calcification of the  aortic valve. There is mild thickening of the aortic valve. Aortic valve  regurgitation is not visualized. Mild aortic valve sclerosis is present,  with no evidence of aortic valve  stenosis.   5. The inferior vena cava is normal in size with greater  than 50%  respiratory variability, suggesting right atrial pressure of 3 mmHg.   Comparison(s): No significant change from prior study.   Conclusion(s)/Recommendation(s): Normal biventricular function without  evidence of hemodynamically significant valvular heart disease.    EKG:  EKG ordered today. EKG personally reviewed and demonstrates ***.  Recent Labs: 04/14/2023: ALT 17; Hemoglobin 11.1; Platelets 395.0 10/22/2023: BUN 13; Creatinine, Ser 0.83; Potassium 4.3; Sodium 139; TSH 1.40  Recent Lipid Panel    Component Value Date/Time   CHOL 163 04/14/2023 0958   TRIG 83.0 04/14/2023 0958   HDL 57.20 04/14/2023 0958   CHOLHDL 3 04/14/2023 0958   VLDL 16.6 04/14/2023 0958   LDLCALC 89 04/14/2023 0958   LDLDIRECT 171.0 10/10/2016 0802    Physical Exam:    Vital Signs: There were no vitals taken for this visit.    Wt Readings from Last 3 Encounters:  03/02/24 169 lb 9.6 oz (76.9 kg)  10/22/23 178 lb 12.8 oz (81.1 kg)  06/11/23 172 lb (78 kg)     General: 73 y.o. female in no acute distress. HEENT: Normocephalic and atraumatic. Sclera clear.  Neck: Supple. No carotid bruits. No JVD. Heart: *** RRR. Distinct S1 and S2. No murmurs, gallops, or rubs.  Lungs: No increased work of breathing. Clear to ausculation bilaterally. No wheezes, rhonchi, or rales.  Abdomen: Soft, non-distended, and non-tender to palpation.  Extremities: No lower extremity edema.  Radial and distal pedal pulses 2+ and equal bilaterally. Skin: Warm and dry. Neuro: No focal deficits. Psych: Normal affect. Responds appropriately.   Assessment:    No diagnosis found.  Plan:     Disposition: Follow up in ***   Signed, Casimer Clear, PA-C  03/14/2024 7:47 AM    [facility]

## 2024-03-17 ENCOUNTER — Ambulatory Visit

## 2024-03-21 NOTE — Progress Notes (Unsigned)
 Cardiology Office Note:  .   Date:  03/21/2024  ID:  Holly Beasley, DOB 1951/08/16, MRN [id number] PCP: Reginal Capra, MD  [facility] Providers Cardiologist:  None { Click to update primary MD,subspecialty MD or APP then REFRESH:1}   History of Present Illness: Holly Beasley   Holly Beasley is a 73 y.o. female  with PMHx of HTN, HLD, DM, GERD, SVT (short RP, adenosine sensitive) who reports to office for cardiac preop clearance.   L3-5 SPINE LUMBAR POSTERIOR DECOMPRESSION on 05/23/2024 Cloteal Daniels, MD [facility] [address] [place] [address] Phone: [phone] Fax: [phone]  Last seen in heartcare by EP 06/11/2023 with Mertha Abrahams, PA-C.  At that time, overall doing well.  Noted rare fleeting flutters.  Denied any CP, dizziness, syncope, or SOB.   Today, reports ### and denies ###. Reports compliance with medications.  Dietary habitats:  Activity level:  Social: Denies tobacco use/Binging ETOH/drug use   Preoperative Cardiovascular Risk Assessment Revised Cardiac Risk Index (RCRI) with # point and perioperative risk of major cardiac events is #%.  Duke Activity Status Index (DASI) with > 4 mets .  From cardiac standpoint, this patient has a low cardiac risk. Therefore, based on ACC/AHA guidelines, patient at acceptable risk for the planned procedure without further cardiovascular testing.   HTN  BP this office visit, well-controlled:  10/2023 K 4.3, Cr 0.83 Continue losartan  50 mg daily, Coreg  12.5 mg BID   HLD, goal LDL < 100 04/2023 AST/ALT WNL, LDL 89 Continue on Lipitor 40 mg daily  DM  02/2024 A1C 6.8 Managed by PCP  SVT  Single episode noted years ago  Studies Reviewed: Holly Beasley   ECHO IMPRESSIONS 03/2021  1. Left ventricular ejection fraction, by estimation, is 60 to 65%. The  left ventricle has normal function. The left ventricle has no regional  wall motion abnormalities. There is mild left ventricular hypertrophy.  Left  ventricular diastolic parameters  were normal.   2. Right ventricular systolic function is normal. The right ventricular  size is normal. There is normal pulmonary artery systolic pressure.   3. The mitral valve is normal in structure. No evidence of mitral valve  regurgitation. No evidence of mitral stenosis.   4. The aortic valve is tricuspid. There is mild calcification of the  aortic valve. There is mild thickening of the aortic valve. Aortic valve  regurgitation is not visualized. Mild aortic valve sclerosis is present,  with no evidence of aortic valve  stenosis.   5. The inferior vena cava is normal in size with greater than 50%  respiratory variability, suggesting right atrial pressure of 3 mmHg.   Comparison(s): No significant change from prior study.   Lexiscan  02/2021 The left ventricular ejection fraction is normal (55-65%). Nuclear stress EF: 62%. There was no ST segment deviation noted during stress. The study is normal. This is a low risk study.  Risk Assessment/Calculations:   {Does this patient have ATRIAL FIBRILLATION?:[phone]} No BP recorded.  {Refresh Note OR Click here to enter BP  :1}***       Physical Exam:   VS:  There were no vitals taken for this visit.   Wt Readings from Last 3 Encounters:  03/02/24 169 lb 9.6 oz (76.9 kg)  10/22/23 178 lb 12.8 oz (81.1 kg)  06/11/23 172 lb (78 kg)    GEN: Well nourished, well developed in no acute distress NECK: No JVD; No carotid bruits CARDIAC: ***RRR, no murmurs,  rubs, gallops RESPIRATORY:  Clear to auscultation without rales, wheezing or rhonchi  ABDOMEN: Soft, non-tender, non-distended EXTREMITIES:  No edema; No deformity   ASSESSMENT AND PLAN: .   ***    {Are you ordering a CV Procedure (e.g. stress test, cath, DCCV, TEE, etc)?   Press F2        :[id number]}  Dispo: ***  Signed, Metta Actis, PA-C

## 2024-03-22 ENCOUNTER — Ambulatory Visit: Attending: Orthopedic Surgery

## 2024-03-22 DIAGNOSIS — M5459 Other low back pain: Secondary | ICD-10-CM | POA: Insufficient documentation

## 2024-03-22 NOTE — Therapy (Signed)
 OUTPATIENT PHYSICAL THERAPY THORACOLUMBAR TREATMENT NOTE   Patient Name: Holly Beasley MRN: [id number] DOB:02/11/1951, 73 y.o., female Today's Date: 03/22/2024  END OF SESSION:  PT End of Session - 03/22/24 1134     Visit Number 17    Number of Visits 19    Date for PT Re-Evaluation 04/07/24    Authorization Type [employer]    PT Start Time 1100    PT Stop Time 1145    PT Time Calculation (min) 45 min    Equipment Utilized During Treatment Gait belt    Activity Tolerance Patient tolerated treatment well    Behavior During Therapy WFL for tasks assessed/performed               Past Medical History:  Diagnosis Date   Allergy    seasonal,take shots   Cataract    bilateral,removed 2019   Depression    Diabetes mellitus without complication (HCC)    Dysrhythmia    SVTs   GERD (gastroesophageal reflux disease)    History of ETOH abuse    recovering  in remission   Hyperlipidemia    Hypertension    Osteoarthritis    Osteopenia    dexa -1.7 h -1.35    Scoliosis    mild scoliosis, leg length discrepancy   Skin cancer    basal cell nose   Sleep apnea    did not meet criteria for cpap-has mouthpiece not using per pt   Substance abuse (HCC)    etoh 40 years ago   SVT (supraventricular tachycardia) (HCC) 08/15/2016   Varicose veins    Wears contact lenses    Past Surgical History:  Procedure Laterality Date   ANTERIOR FUSION CERVICAL SPINE     [phone]   BREAST BIOPSY     2003-rt   CATARACT EXTRACTION, BILATERAL  2019   COLONOSCOPY W/ POLYPECTOMY  2951,8841, 11-2022   precancerous   2004   DENTAL SURGERY  2015   bone graft    KNEE ARTHROSCOPY WITH LATERAL MENISECTOMY Left 11/10/2014   Procedure: LEFT KNEE ARTHROSCOPY PARTIAL LATERAL MENISECTOMY CHONDROPLASTY;  Surgeon: Alphonzo Ask, MD;  Location: [facility];  Service: Orthopedics;  Laterality: Left;   NASAL SINUS SURGERY  11/13/2008   polyps removed   skin cancer removal     basal cell  nose 2004   TONSILLECTOMY AND ADENOIDECTOMY  1960   TOTAL KNEE ARTHROPLASTY Left 02/03/2023   Procedure: LEFT TOTAL KNEE ARTHROPLASTY;  Surgeon: Dayne Even, MD;  Location: [facility];  Service: Orthopedics;  Laterality: Left;   VENOUS ABLATION Left 2015   Dr Timm Foot   Patient Active Problem List   Diagnosis Date Noted   Primary osteoarthritis of left knee 02/03/2023   HLD (hyperlipidemia) 04/14/2016   Essential hypertension 12/18/2014   Medication management 12/18/2014   Varicose veins of leg with complications 10/09/2014   Left knee pain 09/21/2014   Tear of meniscus of knee 09/21/2014   Hx of bacterial sinusitis 08/28/2014   Knee pain, acute 06/26/2014   Leg edema, left 06/20/2014   Varicose veins of bilateral lower extremities with other complications 06/20/2014   Varicose veins 05/03/2014   Inguinal adenopathy by ultrasound doppler 05/03/2014   Left leg swelling 04/25/2014   Diabetes mellitus type 2, controlled, without complications (HCC) 04/25/2014   Visit for preventive health examination 06/07/2012   Foot dermatitis 06/07/2012   Shortness of breath 11/06/2011   Nausea 11/06/2011   Hand eczema 06/27/2011   Prediabetes 03/20/2011  Preventative health care 03/20/2011   Dry eyes    Headache 12/30/2010   Scoliosis    Osteopenia    History of ETOH abuse    Sinusitis, chronic 02/27/2010   LIBIDO, DECREASED 02/27/2010   GERD 02/20/2009   CONSTIPATION, CHRONIC 02/20/2009   SKIN CANCER, HX OF 02/20/2009   OSA (obstructive sleep apnea) 03/21/2008   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 Daphine Eagle  REFERRING PROVIDER: Vickii Grand, PA-C  REFERRING DIAG: spondylolisthesis  Rationale for Evaluation and Treatment: Rehabilitation  THERAPY DIAG:  Other low back pain  ONSET DATE: 01/01/2024- date of referral  SUBJECTIVE:                                                                                                                                                                                           SUBJECTIVE STATEMENT: Mornings are tough but pain gets better as the day goes on. I have lumbar surgery scheduled in August of this year.  PERTINENT HISTORY:  L TKA 02/03/23. PMH: SVT, ACDF,DM  PAIN:  Are you having pain? Yes: NPRS scale: 3/10 Pain location: back pain travels down her LLE Pain description: sharp, radiating Aggravating factors: Standing for prolonged periods of time; walking, getting up after sitting for a long time. Relieving factors: sitting, lying  PRECAUTIONS: None  RED FLAGS: None   WEIGHT BEARING RESTRICTIONS: No  FALLS:  Has patient fallen in last 6 months? No  PLOF: Independent  PATIENT GOALS: manage back pain    OBJECTIVE:  Note: Objective measures were completed at Evaluation unless otherwise noted.  DIAGNOSTIC FINDINGS:  11/28/23 MRI of lumbar spine IMPRESSION: 1. Severe L4-5 facet arthrosis with grade 1 anterolisthesis, severe spinal stenosis, and moderate bilateral neural foraminal stenosis. 2. Mild spinal stenosis and moderate left neural foraminal stenosis at L3-4.  PATIENT SURVEYS:  Modified Oswestry 14/50 = 28% disability   COGNITION: Overall cognitive status: Within functional limits for tasks assessed       LUMBAR ROM:   AROM eval  Flexion   Extension   Right lateral flexion   Left lateral flexion   Right rotation  Left rotation    (Blank rows = not  tested)  LOWER EXTREMITY ROM:     Active  Right eval Left eval  Hip flexion    Hip extension    Hip abduction    Hip adduction    Hip internal rotation    Hip external rotation    Knee flexion    Knee extension    Ankle dorsiflexion    Ankle plantarflexion    Ankle inversion    Ankle eversion     (Blank rows = not tested)  LOWER EXTREMITY MMT:    MMT Right eval Left eval  Hip flexion 5 5  Hip extension    Hip abduction 5 5  Hip adduction 5 5  Hip internal rotation    Hip external rotation    Knee flexion 5 5  Knee extension 5 5  Ankle dorsiflexion 5 5  Ankle plantarflexion    Ankle inversion    Ankle eversion     (Blank rows = not tested)  FUNCTIONAL TESTS:  5 times sit to stand: 28 sec without UE support  TREATMENT:  Discussed sleeping posture at length, keeping neutral spine in sidelying, stabilizing body with pillow in front, hip/knee angle to help with neutral lumbar spine  Manual therapy; STM to bil thoracic and lumbar paraspinalis  TherEx: Sidelying pelvis tilts: 30x Reassessment performed, discussed therapy progress with patient.  PATIENT EDUCATION:  Education details: continue HEP, added to HEP Person educated: Patient Education method: Explanation, Demonstration, and Handouts Education comprehension: verbalized understanding and returned demonstration  HOME EXERCISE PROGRAM: Access Code: [email or web address] URL: [email or web address] Date: 02/11/2024 Prepared by: Susanna Epley  Exercises - Sit to Stand  - 1 x daily - 7 x weekly - 10 reps - Supine Hip External Rotation  - 2 x daily - 7 x weekly - 2-3 reps - 30 sec hold - Supine March  - 2 x daily - 7 x weekly - 2 sets - 10 reps - Supine Heel Slide  - 1 x daily - 7 x weekly - 2 sets - 10 reps - Straight Leg Raise  - 1 x daily - 7 x weekly - 2 sets - 10 reps - Seated Slump Nerve Glide  - 3 x daily - 7 x weekly - 15 reps - Seated Hamstring Curl with Anchored Resistance  - 1 x daily - 7 x  weekly - 2 sets - 10 reps - Seated Flexion Stretch  - 1-2 x daily - 7 x weekly - 10 reps - 10 sec hold - Standing Lumbar Extension with Counter  - 1-2 x daily - 7 x weekly - 10 reps - Supine 90/90 Sciatic Nerve Glide with Knee Flexion/Extension  - 1 x daily - 7 x weekly - 1-2 sets - 10 reps - 5 sec hold - Supine Piriformis Stretch with Foot on Ground  - 1 x daily - 7 x weekly - 3 reps - 30 sec hold    ASSESSMENT:  CLINICAL IMPRESSION: Patient has been seen for total of 17 sessions for chronic lower back pain. Pt currently reports of min to mod pain of 4-5/10 at worst. Patient has demonstrated significant improvement in her functional strength with 5x sit to stand score improvement from 28 seconds (eval) to 13 seconds. Patient has not demo significant improvement in her self reported function with modified Oswestry score. Patient is currently independent with HEP and has reached maximum potential in physical therapy. Patient will be discharged with HEP.  OBJECTIVE IMPAIRMENTS:  decreased activity tolerance, decreased endurance, decreased mobility, difficulty walking, decreased strength, hypomobility, increased muscle spasms, impaired flexibility, postural dysfunction, and pain.   ACTIVITY LIMITATIONS: carrying, lifting, bending, standing, squatting, and sleeping  PARTICIPATION LIMITATIONS: meal prep, cleaning, laundry, shopping, and community activity  PERSONAL FACTORS: Time since onset of injury/illness/exacerbation and 1-2 comorbidities: C4-6 ACDF, L TKR 4/24 are also affecting patient's functional outcome.   REHAB POTENTIAL: Good  CLINICAL DECISION MAKING: Stable/uncomplicated  EVALUATION COMPLEXITY: Low   GOALS: Goals reviewed with patient? Yes  SHORT TERM GOALS: Target date: 02/04/2024  Pt will be 50% compliant with HEP to self manage her symptoms.  Baseline: Goal status: MET   LONG TERM GOALS: Target date: 04/07/2024     Patient will demo 15 points improvement on Modified  Oswestery test to improve overall function.  Baseline: 28% (01/11/24); 24% (8 points) (02/16/24); 26% (03/22/24) Goal status: progressing continue  2.  Pt will be able to perform 5x/ sit to stand in <18 seconds to improve functional strength.  Baseline: 28 sec (01/11/24); 19 sec (02/16/24); 13 sec (03/22/24) Goal status: Not met  3.  Patient will report pain at worst of >6/10 at to improve self management of pain. Baseline: 9/10 (01/11/24); 3-4/10 (02/04/24) Goal status: Goal met   4.  Pt will be 100% compliant with HEP to self manage her symptoms to improve self management.  Baseline: Issued today. Goal status: Goal met   PLAN:discharge from skilled PT (Pt having lumbar surgery in Aug 2025)     Kristine Phalen, PT   03/22/2024, 11:34 AM

## 2024-03-23 ENCOUNTER — Ambulatory Visit: Attending: Cardiology | Admitting: Physician Assistant

## 2024-03-23 ENCOUNTER — Encounter: Payer: Self-pay | Admitting: Physician Assistant

## 2024-03-23 VITALS — BP 150/80 | HR 75 | Ht 68.0 in | Wt 176.2 lb

## 2024-03-23 DIAGNOSIS — I10 Essential (primary) hypertension: Secondary | ICD-10-CM | POA: Diagnosis not present

## 2024-03-23 DIAGNOSIS — E119 Type 2 diabetes mellitus without complications: Secondary | ICD-10-CM

## 2024-03-23 DIAGNOSIS — I471 Supraventricular tachycardia, unspecified: Secondary | ICD-10-CM

## 2024-03-23 DIAGNOSIS — E785 Hyperlipidemia, unspecified: Secondary | ICD-10-CM | POA: Diagnosis not present

## 2024-03-23 DIAGNOSIS — Z01810 Encounter for preprocedural cardiovascular examination: Secondary | ICD-10-CM | POA: Diagnosis not present

## 2024-03-23 MED ORDER — HYDROCHLOROTHIAZIDE 12.5 MG PO CAPS: 12.5000 mg | ORAL_CAPSULE | Freq: Every day | ORAL | 1 refills | Status: DC

## 2024-03-23 NOTE — Patient Instructions (Addendum)
 Medication Instructions:   START HYDROCHLOROTHIAZIDE 12.5 MG DAILY   *If you need a refill on your cardiac medications before your next appointment, please call your pharmacy*  Lab Work:  1) BMP & BNP TODAY---DOWNSTAIRS FIRST FLOOR AT [facility]  2.)  IN ONE WEEK AT ANY [facility] NEAR YOU--BMET   If you have labs (blood work) drawn today and your tests are completely normal, you will receive your results only by: MyChart Message (if you have MyChart) OR A paper copy in the mail If you have any lab test that is abnormal or we need to change your treatment, we will call you to review the results.   Follow-Up:   FOLLOW UP AS PLANNED WITH DR. Marven Slimmer IN AUGUST --HIS SCHEDULER WILL BE REACHING OUT TO YOU BY PHONE SOON TO ARRANGE THIS APPOINTMENT    Other Instructions  Blood Pressure Record Sheet To take your blood pressure, you will need a blood pressure machine. You can buy a blood pressure machine (blood pressure monitor) at your clinic, drug store, or online. When choosing one, consider: An automatic monitor that has an arm cuff. A cuff that wraps snugly around your upper arm. You should be able to fit only one finger between your arm and the cuff. A device that stores blood pressure reading results. Do not choose a monitor that measures your blood pressure from your wrist or finger. Follow your health care provider's instructions for how to take your blood pressure. To use this form: Take your blood pressure medications every day These measurements should be taken when you have been at rest for at least 10-15 min Take at least 2 readings with each blood pressure check. This makes sure the results are correct. Wait 1-2 minutes between measurements. Write down the results in the spaces on this form. Keep in mind it should always be recorded systolic over diastolic. Both numbers are important.  Repeat this every day for 2-3 weeks, or as told by your health care provider.  Make a  follow-up appointment with your health care provider to discuss the results.  Blood Pressure Log Date Medications taken? (Y/N) Blood Pressure Time of Day                                                                                                        WEAR TED HOSE AND COMPRESSION SOCKS TO HELP WITH SWELLING IN LEGS. THERE IS A COMPRESSION STOCKING OUTLET STORE IN [place] WHERE YOU CAN GET [facility]  [address] [place] [address]  [phone]   [facility] Contact for Alternative locations and appointment scheduling   [email or web address]   [email or web address]  [phone]

## 2024-03-24 ENCOUNTER — Ambulatory Visit: Payer: Self-pay | Admitting: Physician Assistant

## 2024-03-24 LAB — BASIC METABOLIC PANEL WITH GFR
BUN/Creatinine Ratio: 21 (ref 12–28)
BUN: 17 mg/dL (ref 8–27)
CO2: 23 mmol/L (ref 20–29)
Calcium: 9.8 mg/dL (ref 8.7–10.3)
Chloride: 101 mmol/L (ref 96–106)
Creatinine, Ser: 0.81 mg/dL (ref 0.57–1.00)
Glucose: 133 mg/dL — ABNORMAL HIGH (ref 70–99)
Potassium: 4.5 mmol/L (ref 3.5–5.2)
Sodium: 139 mmol/L (ref 134–144)
eGFR: 77 mL/min/{1.73_m2} (ref >59)

## 2024-03-24 LAB — PRO B NATRIURETIC PEPTIDE: NT-Pro BNP: 101 pg/mL (ref 0–301)

## 2024-03-30 ENCOUNTER — Other Ambulatory Visit: Payer: Self-pay | Admitting: Family

## 2024-03-31 ENCOUNTER — Telehealth: Payer: Self-pay | Admitting: *Deleted

## 2024-03-31 NOTE — Telephone Encounter (Signed)
-----   Message from Pendleton H sent at 03/30/2024  1:21 PM EDT ----- Regarding: RE: APPT WITH DR. Marven Slimmer FOR AUGUST 2025 Hey! I'm so sorry I haven't seen this until now. She called in to schedule, she was scheduled with Renee instead of Dr. Marven Slimmer. I've left her a voicemail to get put with Dr. Marven Slimmer instead. ----- Message ----- From: Peggi Bowels, LPN Sent: 06/26/7828   3:18 PM EDT To: Scherrie Curt; Cvd-Ep Scheduling Subject: APPT WITH DR. Marven Slimmer FOR AUGUST 2025          Hey friend, we just saw this pt for pre-op clearance and Callie Goodrich PA-C saw she is recalled for new pt appt with Dr. Marven Slimmer.   Is there any way you can call her this week and get the appt with Dr. Marven Slimmer scheduled?  Pt aware you will be reaching out soon.   Thanks friend, James Mcardle

## 2024-04-18 ENCOUNTER — Other Ambulatory Visit: Payer: Self-pay | Admitting: Internal Medicine

## 2024-04-18 MED ORDER — ATORVASTATIN CALCIUM 40 MG PO TABS: 40.0000 mg | ORAL_TABLET | Freq: Every day | ORAL | 0 refills | Status: DC

## 2024-04-18 NOTE — Telephone Encounter (Signed)
 Copied from CRM [id number]. Topic: Clinical - Medication Refill >> Apr 18, 2024  9:18 AM Ernestene P wrote: Medication: atorvastatin  (LIPITOR) 40 MG tablet  Has the patient contacted their pharmacy? Yes (Agent: If no, request that the patient contact the pharmacy for the refill. If patient does not wish to contact the pharmacy document the reason why and proceed with request.) (Agent: If yes, when and what did the pharmacy advise?)  This is the patient's preferred pharmacy:  [id number] - [place], [place] - [address] AT CORNER OF [facility] [address] [place] [address] Phone: [phone] Fax: [phone]  Is this the correct pharmacy for this prescription? Yes If no, delete pharmacy and type the correct one.   Has the prescription been filled recently? No  Is the patient out of the medication? No-almost out   Has the patient been seen for an appointment in the last year OR does the patient have an upcoming appointment? Yes  Can we respond through MyChart? Yes  Agent: Please be advised that Rx refills may take up to 3 business days. We ask that you follow-up with your pharmacy.

## 2024-04-20 ENCOUNTER — Ambulatory Visit: Admitting: Internal Medicine

## 2024-04-20 ENCOUNTER — Encounter: Payer: Self-pay | Admitting: Internal Medicine

## 2024-04-20 VITALS — BP 148/78 | HR 74 | Temp 97.8°F | Resp 16 | Ht 68.0 in | Wt 169.1 lb

## 2024-04-20 DIAGNOSIS — I10 Essential (primary) hypertension: Secondary | ICD-10-CM

## 2024-04-20 MED ORDER — METFORMIN HCL 500 MG PO TABS: ORAL_TABLET | ORAL | 1 refills | Status: AC

## 2024-04-20 NOTE — Progress Notes (Signed)
 Established Patient Office Visit     CC/Reason for Visit: Elevated blood pressure  HPI: Holly Beasley is a 73 y.o. female who is coming in today for the above mentioned reasons. Past Medical History is significant for: Hypertension.  She is scheduled for lumbar fusion in August and because of this has been monitoring her blood pressure more closely.  Systolics have been in the 150-160 range with diastolics in the 70s.  She is currently on losartan  100 mg and Coreg  12.5 mg twice daily.  During her recent visit with cardiology she was prescribed hydrochlorothiazide  12.5 mg but has not started yet.  She feels well.   Past Medical/Surgical History: Past Medical History:  Diagnosis Date   Allergy    seasonal,take shots   Cataract    bilateral,removed 2019   Depression    Diabetes mellitus without complication (HCC)    Dysrhythmia    SVTs   GERD (gastroesophageal reflux disease)    History of ETOH abuse    recovering  in remission   Hyperlipidemia    Hypertension    Osteoarthritis    Osteopenia    dexa -1.7 h -1.35    Scoliosis    mild scoliosis, leg length discrepancy   Skin cancer    basal cell nose   Sleep apnea    did not meet criteria for cpap-has mouthpiece not using per pt   Substance abuse (HCC)    etoh 40 years ago   SVT (supraventricular tachycardia) (HCC) 08/15/2016   Varicose veins    Wears contact lenses     Past Surgical History:  Procedure Laterality Date   ANTERIOR FUSION CERVICAL SPINE     [phone]   BREAST BIOPSY     2003-rt   CATARACT EXTRACTION, BILATERAL  2019   COLONOSCOPY W/ POLYPECTOMY  [id number], 11-2022   precancerous   2004   DENTAL SURGERY  2015   bone graft    KNEE ARTHROSCOPY WITH LATERAL MENISECTOMY Left 11/10/2014   Procedure: LEFT KNEE ARTHROSCOPY PARTIAL LATERAL MENISECTOMY CHONDROPLASTY;  Surgeon: Maude KANDICE Herald, MD;  Location: [facility];  Service: Orthopedics;  Laterality: Left;   NASAL SINUS SURGERY   11/13/2008   polyps removed   skin cancer removal     basal cell nose 2004   TONSILLECTOMY AND ADENOIDECTOMY  1960   TOTAL KNEE ARTHROPLASTY Left 02/03/2023   Procedure: LEFT TOTAL KNEE ARTHROPLASTY;  Surgeon: Herald Maude, MD;  Location: [facility];  Service: Orthopedics;  Laterality: Left;   VENOUS ABLATION Left 2015   Dr Gerlean    Social History:  reports that she has never smoked. She has never used smokeless tobacco. She reports that she does not drink alcohol and does not use drugs.  Allergies: Allergies  Allergen Reactions   Bactrim [Sulfamethoxazole-Trimethoprim]     Rash and stomach pain   Sulfamethoxazole     Rash and stomach pain   Meloxicam      rash   Amoxicillin Rash    REACTION: unknown- GI upset/cramps   Cephalexin     REACTION: unknown: GI upset/cramps    Family History:  Family History  Problem Relation Age of Onset   Lymphoma Mother    Diabetes Mother    Hyperlipidemia Mother    Colon polyps Mother    Heart failure Father    Hyperlipidemia Father    Arthritis Brother    Alcohol abuse Brother    Alcohol abuse Brother    Colon cancer Neg  Hx    Esophageal cancer Neg Hx    Rectal cancer Neg Hx    Stomach cancer Neg Hx    Crohn's disease Neg Hx    Ulcerative colitis Neg Hx    BRCA 1/2 Neg Hx    Breast cancer Neg Hx      Current Outpatient Medications:    atorvastatin  (LIPITOR) 40 MG tablet, Take 1 tablet (40 mg total) by mouth daily., Disp: 90 tablet, Rfl: 0   azelastine (ASTELIN) 0.1 % nasal spray, Place 2 sprays into both nostrils in the morning., Disp: , Rfl:    carvedilol  (COREG ) 12.5 MG tablet, Take 1 tablet (12.5 mg total) by mouth 2 (two) times daily., Disp: 180 tablet, Rfl: 3   cetirizine (ZYRTEC) 10 MG tablet, Take 10 mg by mouth at bedtime., Disp: , Rfl:    Cholecalciferol (VITAMIN D3) 25 MCG (1000 UT) capsule, Take 1,000 Units by mouth daily., Disp: , Rfl:    Cyanocobalamin  (B-12) 1000 MCG TABS, Take 2,500 mcg by mouth daily., Disp: ,  Rfl:    dapagliflozin  propanediol (FARXIGA ) 10 MG TABS tablet, Take 1 tablet (10 mg total) by mouth daily before breakfast. Dosage change, Disp: 90 tablet, Rfl: 0   DULoxetine  (CYMBALTA ) 60 MG capsule, Take 2 capsules (120 mg total) by mouth at bedtime., Disp: 180 capsule, Rfl: 1   EPINEPHrine  0.3 mg/0.3 mL IJ SOAJ injection, Inject 0.3 mg into the muscle as needed for anaphylaxis., Disp: , Rfl:    HYDROcodone -acetaminophen  (NORCO) 10-325 MG tablet, Take 1 tablet by mouth every 6 (six) hours as needed., Disp: , Rfl:    losartan  (COZAAR ) 50 MG tablet, Take 2 per day, Disp: 90 tablet, Rfl: 3   Magnesium 400 MG TABS, Take 400 mg by mouth daily., Disp: , Rfl:    montelukast  (SINGULAIR ) 10 MG tablet, TAKE 1 TABLET BY MOUTH EVERYDAY AT BEDTIME, Disp: 90 tablet, Rfl: 0   Multiple Vitamins-Minerals (MULTIVITAMIN GUMMIES ADULTS PO), Take by mouth daily. Take 2 daily, Disp: , Rfl:    omeprazole  (PRILOSEC) 40 MG capsule, TAKE 1 CAPSULE BY MOUTH EVERY DAY, Disp: 90 capsule, Rfl: 0   hydrochlorothiazide  (MICROZIDE ) 12.5 MG capsule, Take 1 capsule (12.5 mg total) by mouth daily. (Patient not taking: Reported on 04/20/2024), Disp: 90 capsule, Rfl: 1   metFORMIN  (GLUCOPHAGE ) 500 MG tablet, TAKE 4 TABLETS BY MOUTH EVERY DAY WITH A MEAL, Disp: 360 tablet, Rfl: 1  Review of Systems:  Negative unless indicated in HPI.   Physical Exam: Vitals:   04/20/24 0850  BP: (!) 148/78  Pulse: 74  Resp: 16  Temp: 97.8 F (36.6 C)  SpO2: 96%  Weight: 169 lb 1.6 oz (76.7 kg)  Height: 5' 8 (1.727 m)    Body mass index is 25.71 kg/m.   Impression and Plan:  Essential hypertension  Other orders -     metFORMIN  HCl; TAKE 4 TABLETS BY MOUTH EVERY DAY WITH A MEAL  Dispense: 360 tablet; Refill: 1   - Advise she go ahead and start hydrochlorothiazide  12.5 mg.  She has follow-up with cardiology beginning of August and will have renal function and electrolytes checked there.  She will continue to monitor her blood  pressure.  Time spent:22 minutes reviewing chart, interviewing and examining patient and formulating plan of care.     Tully Theophilus Andrews, MD [facility]

## 2024-04-21 ENCOUNTER — Other Ambulatory Visit: Payer: Self-pay | Admitting: Internal Medicine

## 2024-04-25 ENCOUNTER — Ambulatory Visit: Admitting: Physician Assistant

## 2024-05-10 LAB — CBC AND DIFFERENTIAL
HCT: 40 (ref 36–46)
Hemoglobin: 12.8 (ref 12.0–16.0)
WBC: 7

## 2024-05-10 LAB — BASIC METABOLIC PANEL WITH GFR
BUN: 20 (ref 4–21)
CO2: 25 — AB (ref 13–22)
Chloride: 100 (ref 99–108)
Creatinine: 0.9 (ref 0.5–1.1)
Glucose: 180
Potassium: 4.3 meq/L (ref 3.5–5.1)
Sodium: 136 — AB (ref 137–147)

## 2024-05-10 LAB — COMPREHENSIVE METABOLIC PANEL WITH GFR
Albumin: 4.3 (ref 3.5–5.0)
Calcium: 10.2 (ref 8.7–10.7)
Globulin: 3.4
eGFR: 67

## 2024-05-10 LAB — CBC: RBC: 4.25 (ref 3.87–5.11)

## 2024-05-10 LAB — HEMOGLOBIN A1C: Hemoglobin A1C: 7.2

## 2024-05-10 LAB — HEPATIC FUNCTION PANEL
ALT: 24 U/L (ref 7–35)
AST: 21 (ref 13–35)

## 2024-05-13 HISTORY — PX: BACK SURGERY: SHX140

## 2024-05-16 ENCOUNTER — Other Ambulatory Visit: Payer: Self-pay | Admitting: Internal Medicine

## 2024-05-16 NOTE — Telephone Encounter (Unsigned)
 Copied from CRM [id number]. Topic: Clinical - Medication Refill >> May 16, 2024  9:45 AM Alexandria E wrote: Medication: dapagliflozin  propanediol (FARXIGA ) 10 MG TABS tablet  Has the patient contacted their pharmacy? No (Agent: If no, request that the patient contact the pharmacy for the refill. If patient does not wish to contact the pharmacy document the reason why and proceed with request.) (Agent: If yes, when and what did the pharmacy advise?)  This is the patient's preferred pharmacy:  [id number] - [place], [place] - [address] AT [address] OF [facility] [address] [place] [address] Phone: [phone] Fax: [phone]  Is this the correct pharmacy for this prescription? Yes If no, delete pharmacy and type the correct one.   Has the prescription been filled recently? Yes  Is the patient out of the medication? No, 10 days left.  Has the patient been seen for an appointment in the last year OR does the patient have an upcoming appointment? Yes  Can we respond through MyChart? Yes  Agent: Please be advised that Rx refills may take up to 3 business days. We ask that you follow-up with your pharmacy.

## 2024-05-18 MED ORDER — DAPAGLIFLOZIN PROPANEDIOL 10 MG PO TABS: 10.0000 mg | ORAL_TABLET | Freq: Every day | ORAL | 0 refills | Status: AC

## 2024-05-19 ENCOUNTER — Encounter: Payer: Self-pay | Admitting: Cardiology

## 2024-05-19 ENCOUNTER — Other Ambulatory Visit: Payer: Self-pay | Admitting: Internal Medicine

## 2024-05-19 ENCOUNTER — Ambulatory Visit: Attending: Cardiology | Admitting: Cardiology

## 2024-05-19 ENCOUNTER — Encounter: Payer: Self-pay | Admitting: Internal Medicine

## 2024-05-19 VITALS — BP 124/74 | HR 76 | Ht 68.0 in | Wt 171.0 lb

## 2024-05-19 DIAGNOSIS — I471 Supraventricular tachycardia, unspecified: Secondary | ICD-10-CM | POA: Diagnosis not present

## 2024-05-19 DIAGNOSIS — I10 Essential (primary) hypertension: Secondary | ICD-10-CM

## 2024-05-19 MED ORDER — LOSARTAN POTASSIUM 100 MG PO TABS: 100.0000 mg | ORAL_TABLET | Freq: Every day | ORAL | 3 refills | Status: AC

## 2024-05-19 NOTE — Patient Instructions (Signed)

## 2024-05-19 NOTE — Progress Notes (Signed)
  Electrophysiology Office Follow up Visit Note:    Date:  05/19/2024   ID:  Holly Beasley, DOB [birth year], MRN [id number]  PCP:  Charlett Apolinar POUR, MD  [facility] Cardiologist:  None  [facility] Electrophysiologist:  OLE ONEIDA HOLTS, MD    Interval History:     Holly Beasley is a 73 y.o. female who presents for a follow up visit.    The patient was last seen by [facility] June 11, 2023.  She has hypertension, hyperlipidemia, diabetes, GERD, SVT.  Was previously followed by Dr. Kelsie.  At the appointment with [facility] she was doing well. She has been doing well.  No complaints.  Blood pressures have been controlled on the 100 mg dose of losartan  in addition to the 12.5 mg dose of hydrochlorothiazide .  She brings in blood work for my review.  Kidney function and electrolytes are normal.  She has an upcoming surgery.      Past medical, surgical, social and family history were reviewed.  ROS:   Please see the history of present illness.    All other systems reviewed and are negative.  EKGs/Labs/Other Studies Reviewed:    The following studies were reviewed today:          Physical Exam:    VS:  BP 124/74 (BP Location: Right Arm, Patient Position: Sitting, Cuff Size: Large)   Pulse 76   Ht 5' 8 (1.727 m)   Wt 171 lb (77.6 kg)   SpO2 96%   BMI 26.00 kg/m     Wt Readings from Last 3 Encounters:  05/19/24 171 lb (77.6 kg)  04/20/24 169 lb 1.6 oz (76.7 kg)  03/23/24 176 lb 3.2 oz (79.9 kg)     GEN: no distress CARD: RRR, No MRG RESP: No IWOB. CTAB.      ASSESSMENT:    1. Primary hypertension   2. SVT (supraventricular tachycardia) (HCC)    PLAN:    In order of problems listed above:  #SVT Single episode in the remote past.  No recurrence.  #Hypertension At goal today.  Recommend checking blood pressures 1-2 times per week at home and recording the values.  Recommend bringing these recordings to the primary care physician.   Follow-up with EP  on an as-needed basis   Signed, OLE HOLTS, MD, [facility] 05/19/2024 3:32 PM    Electrophysiology [facility]

## 2024-05-25 ENCOUNTER — Telehealth: Payer: Self-pay

## 2024-05-25 NOTE — Transitions of Care (Post Inpatient/ED Visit) (Signed)
 05/25/2024  Name: Holly Beasley MRN: [id number] DOB: 01-13-1951  Today's TOC FU Call Status: Today's TOC FU Call Status:: Successful TOC FU Call Completed TOC FU Call Complete Date: 05/25/24 Patient's Name and Date of Birth confirmed.  Transition Care Management Follow-up Telephone Call Date of Discharge: 05/23/24 Discharge Facility: Other [facility]) Name of Other (Non[facility]) Discharge Facility: [facility] Type of Discharge: Inpatient Admission Primary Inpatient Discharge Diagnosis:: spine surgrey How have you been since you were released from the hospital?: Better Any questions or concerns?: No  Items Reviewed: Did you receive and understand the discharge instructions provided?: Yes Medications obtained,verified, and reconciled?: Yes (Medications Reviewed) Any new allergies since your discharge?: No Dietary orders reviewed?: No Do you have support at home?: Yes People in Home [RPT]: spouse Name of Support/Comfort Primary Source: robert  Medications Reviewed Today: Medications Reviewed Today     Reviewed by Elner Shan NOVAK, CMA (Certified Medical Assistant) on 05/25/24 at 1350  Med List Status: <None>   Medication Order Taking? Sig Documenting Provider Last Dose Status Informant  atorvastatin  (LIPITOR) 40 MG tablet [id number] Yes Take 1 tablet (40 mg total) by mouth daily. Panosh, Wanda K, MD  Active   azelastine (ASTELIN) 0.1 % nasal spray [id number] Yes Place 2 sprays into both nostrils in the morning. [provider]  Active Self  carvedilol  (COREG ) 12.5 MG tablet [id number] Yes Take 1 tablet (12.5 mg total) by mouth 2 (two) times daily. Leverne Charlies Helling, PA-C  Active   cetirizine (ZYRTEC) 10 MG tablet [id number] Yes Take 10 mg by mouth at bedtime. [provider]  Active Self  Cholecalciferol (VITAMIN D3) 25 MCG (1000 UT) capsule [id number] Yes Take 1,000 Units by mouth daily. [provider]  Active Self  Cyanocobalamin  (B-12) 1000  MCG TABS [id number] Yes Take 2,500 mcg by mouth daily. [provider]  Active Self  dapagliflozin  propanediol (FARXIGA ) 10 MG TABS tablet [id number] Yes Take 1 tablet (10 mg total) by mouth daily before breakfast. Dosage change Panosh, Wanda K, MD  Active   DULoxetine  (CYMBALTA ) 60 MG capsule [id number] Yes TAKE 2 CAPSULES (120 MG TOTAL) BY MOUTH AT BEDTIME. Panosh, Apolinar POUR, MD  Active   EPINEPHrine  0.3 mg/0.3 mL IJ SOAJ injection [id number] Yes Inject 0.3 mg into the muscle as needed for anaphylaxis. [provider]  Active Self  hydrochlorothiazide  (MICROZIDE ) 12.5 MG capsule [id number] Yes Take 1 capsule (12.5 mg total) by mouth daily. Sheron Lorette GRADE, PA-C  Active   HYDROcodone -acetaminophen  [facility]) 10-325 MG tablet [id number] Yes Take 1 tablet by mouth every 6 (six) hours as needed. [provider]  Active   losartan  (COZAAR ) 100 MG tablet [id number] Yes Take 1 tablet (100 mg total) by mouth daily. Cindie Ole DASEN, MD  Active   Magnesium 400 MG TABS [id number] Yes Take 400 mg by mouth daily. [provider]  Active Self  metFORMIN  (GLUCOPHAGE ) 500 MG tablet [id number] Yes TAKE 4 TABLETS BY MOUTH EVERY DAY WITH A MEAL Panosh, Wanda K, MD  Active   montelukast  (SINGULAIR ) 10 MG tablet [id number] Yes TAKE 1 TABLET BY MOUTH EVERYDAY AT BEDTIME Webb, Padonda B, FNP  Active Self  Multiple Vitamins-Minerals (MULTIVITAMIN GUMMIES ADULTS PO) [id number] Yes Take by mouth daily. Take 2 daily [provider]  Active Self  omeprazole  (PRILOSEC) 40 MG capsule [id number] Yes TAKE 1 CAPSULE BY MOUTH EVERY DAY Panosh, Wanda K, MD  Active             Home Care  and Equipment/Supplies: Were Home Health Services Ordered?: No Any new equipment or medical supplies ordered?: No  Functional Questionnaire: Do you need assistance with bathing/showering or dressing?: No Do you need assistance with meal preparation?: No Do you need assistance with eating?: No Do you have  difficulty maintaining continence: No Do you need assistance with getting out of bed/getting out of a chair/moving?: No Do you have difficulty managing or taking your medications?: No  Follow up appointments reviewed: PCP Follow-up appointment confirmed?: NA Specialist Hospital Follow-up appointment confirmed?: Yes Date of Specialist follow-up appointment?: 06/21/24 Follow-Up Specialty Provider:: Heagen, Kristyn Do you need transportation to your follow-up appointment?: No Do you understand care options if your condition(s) worsen?: Yes-patient verbalized understanding    SIGNATURE Jeremey Bascom,CMA

## 2024-05-26 ENCOUNTER — Telehealth: Payer: Self-pay

## 2024-05-26 DIAGNOSIS — E1165 Type 2 diabetes mellitus with hyperglycemia: Secondary | ICD-10-CM

## 2024-05-26 NOTE — Telephone Encounter (Signed)
 Pt was contacted by Shan RAMAN., CMA for Transfer of Care from hospital. [facility] relay message stating pt requesting blood glucose device.   Please advise.

## 2024-05-27 ENCOUNTER — Other Ambulatory Visit: Payer: Self-pay | Admitting: Physician Assistant

## 2024-05-29 NOTE — Telephone Encounter (Signed)
 It looks like she had her spine surgery   And that's why she was in hospital   Giver her whatever monitoring device she needs . For her diabetes

## 2024-05-30 MED ORDER — ACCU-CHEK GUIDE ME W/DEVICE KIT: PACK | 0 refills | Status: AC

## 2024-05-30 NOTE — Telephone Encounter (Signed)
 Attempted to reach pt. Left a voicemail to call us back.

## 2024-05-30 NOTE — Addendum Note (Signed)
 Addended by: Qamar Rosman on: 05/30/2024 03:15 PM   Modules accepted: Orders

## 2024-05-31 ENCOUNTER — Other Ambulatory Visit: Payer: Self-pay | Admitting: Physician Assistant

## 2024-06-02 NOTE — Telephone Encounter (Signed)
 Attempted to reach pt to follow up glucose monitor. Left detail message if have questions or concerns about the prescription, to call us  back.

## 2024-06-03 MED ORDER — CARVEDILOL 12.5 MG PO TABS: 12.5000 mg | ORAL_TABLET | Freq: Two times a day (BID) | ORAL | 3 refills | Status: AC

## 2024-06-20 ENCOUNTER — Other Ambulatory Visit: Payer: Self-pay | Admitting: Internal Medicine

## 2024-07-19 ENCOUNTER — Ambulatory Visit: Admitting: Internal Medicine

## 2024-07-19 ENCOUNTER — Other Ambulatory Visit: Payer: Self-pay | Admitting: Internal Medicine

## 2024-07-19 ENCOUNTER — Encounter: Payer: Self-pay | Admitting: Internal Medicine

## 2024-07-19 VITALS — BP 130/74 | HR 72 | Temp 98.1°F | Ht 68.0 in | Wt 169.2 lb

## 2024-07-19 DIAGNOSIS — Z8261 Family history of arthritis: Secondary | ICD-10-CM

## 2024-07-19 DIAGNOSIS — Z7984 Long term (current) use of oral hypoglycemic drugs: Secondary | ICD-10-CM

## 2024-07-19 DIAGNOSIS — Z79899 Other long term (current) drug therapy: Secondary | ICD-10-CM | POA: Diagnosis not present

## 2024-07-19 DIAGNOSIS — M2550 Pain in unspecified joint: Secondary | ICD-10-CM

## 2024-07-19 DIAGNOSIS — Z981 Arthrodesis status: Secondary | ICD-10-CM | POA: Diagnosis not present

## 2024-07-19 DIAGNOSIS — E1165 Type 2 diabetes mellitus with hyperglycemia: Secondary | ICD-10-CM | POA: Diagnosis not present

## 2024-07-19 DIAGNOSIS — I10 Essential (primary) hypertension: Secondary | ICD-10-CM

## 2024-07-19 DIAGNOSIS — E785 Hyperlipidemia, unspecified: Secondary | ICD-10-CM

## 2024-07-19 LAB — POCT GLYCOSYLATED HEMOGLOBIN (HGB A1C): Hemoglobin A1C: 6.9 % — AB (ref 4.0–5.6)

## 2024-07-19 NOTE — Progress Notes (Signed)
 Future labs  due jan 26

## 2024-07-19 NOTE — Progress Notes (Signed)
 Chief Complaint  Patient presents with   Medical Management of Chronic Issues    Pt is here for her f/u. Pt reports she had back surgery in August. Also reports she keep getting brand name Farxiga . Would like to get generic as cost is better for her.     HPI: Holly Beasley 73 y.o. come in for Chronic disease management  Is now s/p  lumbar surgery and doing ok  DM  see below had A1c in end July  hasn't been checking sinc surgery may be ok  ocst of farxiga  went up and asks for less expensive form she had before ?  HT no change meds  MS had back surgery for  sciatic better still in recovery   not at 3 mos every  HLD no change meds  Ms     asks if should see rheum  as other joints bither her knees and shoulders  and bro has RA  Allergy shots   has cat and allergic  and doing better  ROS: See pertinent positives and negatives per HPI.  Past Medical History:  Diagnosis Date   Allergy    seasonal,take shots   Cataract    bilateral,removed 2019   Depression    Diabetes mellitus without complication (HCC)    Dysrhythmia    SVTs   GERD (gastroesophageal reflux disease)    History of ETOH abuse    recovering  in remission   Hyperlipidemia    Hypertension    Osteoarthritis    Osteopenia    dexa -1.7 h -1.35    Scoliosis    mild scoliosis, leg length discrepancy   Skin cancer    basal cell nose   Sleep apnea    did not meet criteria for cpap-has mouthpiece not using per pt   Substance abuse (HCC)    etoh 40 years ago   SVT (supraventricular tachycardia) 08/15/2016   Varicose veins    Wears contact lenses     Family History  Problem Relation Age of Onset   Lymphoma Mother    Diabetes Mother    Hyperlipidemia Mother    Colon polyps Mother    Heart failure Father    Hyperlipidemia Father    Arthritis Brother    Alcohol abuse Brother    Alcohol abuse Brother    Colon cancer Neg Hx    Esophageal cancer Neg Hx    Rectal cancer Neg Hx    Stomach cancer Neg Hx     Crohn's disease Neg Hx    Ulcerative colitis Neg Hx    BRCA 1/2 Neg Hx    Breast cancer Neg Hx     Social History   Socioeconomic History   Marital status: Married    Spouse name: Not on file   Number of children: Not on file   Years of education: Not on file   Highest education level: Not on file  Occupational History   Not on file  Tobacco Use   Smoking status: Never   Smokeless tobacco: Never  Vaping Use   Vaping status: Never Used  Substance and Sexual Activity   Alcohol use: No    Alcohol/week: 0.0 standard drinks of alcohol    Comment: recovering-1983   Drug use: No   Sexual activity: Not Currently  Other Topics Concern   Not on file  Social History Narrative   2 cats   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    Retired Equities trader with [employer]   Married   ETOH recovering   Hhof 2    Has family in [place]         Social Drivers of Health   Financial Resource Strain: Low Risk  (05/24/2024)   Received from [facility]   Overall Financial Resource Strain (CARDIA)    How hard is it for you to pay for the very basics like food, housing, medical care, and heating?: Not hard at all  Food Insecurity: No Food Insecurity (03/02/2024)   Received from [facility]   Hunger Vital Sign    Within the past 12 months, you worried that your food would run out before you got the money to buy more.: Never true    Within the past 12 months, the food you bought just didn't last and you didn't have money to get more.: Never true  Transportation Needs: No Transportation Needs (03/02/2024)   Received from [facility] - Transportation    Lack of Transportation (Medical): No    Lack of Transportation (Non-Medical): No  Physical Activity: Not on file  Stress: No Stress Concern Present (05/23/2024)   Received from [facility] - Occupational Stress Questionnaire    Do you feel  stress - tense, restless, nervous, or anxious, or unable to sleep at night because your mind is troubled all the time - these days?: Only a little  Social Connections: Not on file    Outpatient Medications Prior to Visit  Medication Sig Dispense Refill   atorvastatin  (LIPITOR) 40 MG tablet TAKE 1 TABLET BY MOUTH EVERY DAY 90 tablet 0   azelastine (ASTELIN) 0.1 % nasal spray Place 2 sprays into both nostrils in the morning.     carvedilol  (COREG ) 12.5 MG tablet Take 1 tablet (12.5 mg total) by mouth 2 (two) times daily. 180 tablet 3   cetirizine (ZYRTEC) 10 MG tablet Take 10 mg by mouth at bedtime.     Cholecalciferol (VITAMIN D3) 25 MCG (1000 UT) capsule Take 1,000 Units by mouth daily.     Cyanocobalamin  (B-12) 1000 MCG TABS Take 2,500 mcg by mouth daily.     dapagliflozin  propanediol (FARXIGA ) 10 MG TABS tablet Take 1 tablet (10 mg total) by mouth daily before breakfast. Dosage change 90 tablet 0   DULoxetine  (CYMBALTA ) 60 MG capsule TAKE 2 CAPSULES (120 MG TOTAL) BY MOUTH AT BEDTIME. 180 capsule 1   EPINEPHrine  0.3 mg/0.3 mL IJ SOAJ injection Inject 0.3 mg into the muscle as needed for anaphylaxis.     hydrochlorothiazide  (MICROZIDE ) 12.5 MG capsule Take 1 capsule (12.5 mg total) by mouth daily. 90 capsule 1   losartan  (COZAAR ) 100 MG tablet Take 1 tablet (100 mg total) by mouth daily. 90 tablet 3   losartan  (COZAAR ) 50 MG tablet TAKE 1 TABLET BY MOUTH EVERY DAY 90 tablet 3   Magnesium 400 MG TABS Take 400 mg by mouth daily.     metFORMIN  (GLUCOPHAGE ) 500 MG tablet TAKE 4 TABLETS BY MOUTH EVERY DAY WITH A MEAL 360 tablet 1   montelukast  (SINGULAIR ) 10 MG tablet TAKE 1 TABLET BY MOUTH EVERYDAY AT BEDTIME 90 tablet 0   Multiple Vitamins-Minerals (MULTIVITAMIN GUMMIES ADULTS PO) Take by mouth daily. Take 2 daily     omeprazole  (PRILOSEC) 40 MG capsule TAKE 1 CAPSULE BY MOUTH EVERY DAY 90 capsule 0   Oxycodone  HCl 10 MG TABS Take 10 mg by mouth  every 4 (four) hours as needed.     Blood Glucose  Monitoring Suppl (ACCU-CHEK GUIDE ME) w/Device KIT Use as instructed (Patient not taking: Reported on 07/19/2024) 1 kit 0   HYDROcodone -acetaminophen  (NORCO) 10-325 MG tablet Take 1 tablet by mouth every 6 (six) hours as needed. (Patient not taking: Reported on 07/19/2024)     No facility-administered medications prior to visit.     EXAM:  BP 130/74 (BP Location: Left Arm, Patient Position: Sitting, Cuff Size: Normal)   Pulse 72   Temp 98.1 F (36.7 C) (Oral)   Ht 5' 8 (1.727 m)   Wt 169 lb 3.2 oz (76.7 kg)   SpO2 96%   BMI 25.73 kg/m   Body mass index is 25.73 kg/m.  GENERAL: vitals reviewed and listed above, alert, oriented, appears well hydrated and in no acute distress HEENT: atraumatic, conjunctiva  clear, no obvious abnormalities on inspection of external nose and ears   NECK: no obvious masses on inspection palpation  LUNGS: clear to auscultation bilaterally, no wheezes, rales or rhonchi, good air movement back brace  CV: HRRR, no clubbing cyanosis or  peripheral edema nl cap refill  MS: moves all extremities without noticeable focal  abnormality PSYCH: pleasant and cooperative, no obvious depression or anxiety Lab Results  Component Value Date   WBC 7.0 05/10/2024   HGB 12.8 05/10/2024   HCT 40 05/10/2024   PLT 395.0 04/14/2023   GLUCOSE 133 (H) 03/23/2024   CHOL 163 04/14/2023   TRIG 83.0 04/14/2023   HDL 57.20 04/14/2023   LDLDIRECT 171.0 10/10/2016   LDLCALC 89 04/14/2023   ALT 24 05/10/2024   AST 21 05/10/2024   NA 136 (A) 05/10/2024   K 4.3 05/10/2024   CL 100 05/10/2024   CREATININE 0.9 05/10/2024   BUN 20 05/10/2024   CO2 25 (A) 05/10/2024   TSH 1.40 10/22/2023   HGBA1C 6.9 (A) 07/19/2024   BP Readings from Last 3 Encounters:  07/19/24 130/74  05/19/24 124/74  04/20/24 (!) 148/78    ASSESSMENT AND PLAN:  Discussed the following assessment and plan:  Type 2 diabetes mellitus with hyperglycemia, without long-term current use of insulin (HCC) -  Plan: POC HgB A1c  Medication management  Multiple joint pain  S/P lumbar fusion July 25 - Lumbar 3-5 A1c controlled  Look into med  cost coverage as  indicated  .she has enough med for now. Most of sx seem like OA  but readdress at f/u Jan and consider adding inflammatory  markers and reeval as indicated    Future orders to include ana rf crp and anticitrr aby b12  Time  30 minutes review discussion  plan and orders future .  -Patient advised to return or notify health care team  if  new concerns arise.  Patient Instructions  Good to see you today . Update A1c  Will ask pharmacy  about the farxiga   rx question.   Lets do  fu in Jan  and lab at that time     Osteoarthritis is the probable cause of joint problems but  reasonable to  evaluate for inflammatory arthritis   referral as needed .  Marcques Wrightsman K. Aram Domzalski M.D.

## 2024-07-19 NOTE — Patient Instructions (Addendum)
 Good to see you today . Update A1c  Will ask pharmacy  about the farxiga   rx question.   Lets do  fu in Jan  and lab at that time     Osteoarthritis is the probable cause of joint problems but  reasonable to  evaluate for inflammatory arthritis   referral as needed .

## 2024-08-18 ENCOUNTER — Other Ambulatory Visit: Payer: Self-pay | Admitting: Internal Medicine

## 2024-08-18 DIAGNOSIS — Z1231 Encounter for screening mammogram for malignant neoplasm of breast: Secondary | ICD-10-CM

## 2024-09-08 ENCOUNTER — Other Ambulatory Visit: Payer: Self-pay | Admitting: Physician Assistant

## 2024-09-08 DIAGNOSIS — I471 Supraventricular tachycardia, unspecified: Secondary | ICD-10-CM

## 2024-09-08 DIAGNOSIS — E119 Type 2 diabetes mellitus without complications: Secondary | ICD-10-CM

## 2024-09-08 DIAGNOSIS — E785 Hyperlipidemia, unspecified: Secondary | ICD-10-CM

## 2024-09-08 DIAGNOSIS — Z01810 Encounter for preprocedural cardiovascular examination: Secondary | ICD-10-CM

## 2024-09-08 DIAGNOSIS — I10 Essential (primary) hypertension: Secondary | ICD-10-CM

## 2024-09-19 ENCOUNTER — Ambulatory Visit

## 2024-09-19 ENCOUNTER — Other Ambulatory Visit: Payer: Self-pay

## 2024-09-19 DIAGNOSIS — Z981 Arthrodesis status: Secondary | ICD-10-CM | POA: Insufficient documentation

## 2024-09-19 DIAGNOSIS — M5459 Other low back pain: Secondary | ICD-10-CM | POA: Diagnosis present

## 2024-09-19 NOTE — Therapy (Signed)
 OUTPATIENT PHYSICAL THERAPY THORACOLUMBAR EVALUATION   Patient Name: Holly Beasley MRN: [id number] DOB:06-21-1951, 73 y.o., female Today's Date: 09/19/2024  END OF SESSION:  PT End of Session - 09/19/24 1104     Visit Number 1    Number of Visits 13    Date for Recertification  10/31/24    PT Start Time 1100    PT Stop Time 1145    PT Time Calculation (min) 45 min    Equipment Utilized During Treatment Gait belt    Activity Tolerance Patient tolerated treatment well    Behavior During Therapy WFL for tasks assessed/performed          Past Medical History:  Diagnosis Date   Allergy    seasonal,take shots   Cataract    bilateral,removed 2019   Depression    Diabetes mellitus without complication (HCC)    Dysrhythmia    SVTs   GERD (gastroesophageal reflux disease)    History of ETOH abuse    recovering  in remission   Hyperlipidemia    Hypertension    Osteoarthritis    Osteopenia    dexa -1.7 h -1.35    Scoliosis    mild scoliosis, leg length discrepancy   Skin cancer    basal cell nose   Sleep apnea    did not meet criteria for cpap-has mouthpiece not using per pt   Substance abuse (HCC)    etoh 40 years ago   SVT (supraventricular tachycardia) 08/15/2016   Varicose veins    Wears contact lenses    Past Surgical History:  Procedure Laterality Date   ANTERIOR FUSION CERVICAL SPINE     [phone]   BACK SURGERY  05/2024   fusion in L3,4,5   BREAST BIOPSY     2003-rt   CATARACT EXTRACTION, BILATERAL  2019   COLONOSCOPY W/ POLYPECTOMY  2004,2010, 11-2022   precancerous   2004   DENTAL SURGERY  2015   bone graft    KNEE ARTHROSCOPY WITH LATERAL MENISECTOMY Left 11/10/2014   Procedure: LEFT KNEE ARTHROSCOPY PARTIAL LATERAL MENISECTOMY CHONDROPLASTY;  Surgeon: Maude KANDICE Herald, MD;  Location: [facility];  Service: Orthopedics;  Laterality: Left;   NASAL SINUS SURGERY  11/13/2008   polyps removed   skin cancer removal     basal cell  nose 2004   TONSILLECTOMY AND ADENOIDECTOMY  1960   TOTAL KNEE ARTHROPLASTY Left 02/03/2023   Procedure: LEFT TOTAL KNEE ARTHROPLASTY;  Surgeon: Herald Maude, MD;  Location: [facility];  Service: Orthopedics;  Laterality: Left;   VENOUS ABLATION Left 2015   Dr Gerlean   Patient Active Problem List   Diagnosis Date Noted   Primary osteoarthritis of left knee 02/03/2023   HLD (hyperlipidemia) 04/14/2016   Essential hypertension 12/18/2014   Medication management 12/18/2014   Varicose veins of leg with complications 10/09/2014   Left knee pain 09/21/2014   Tear of meniscus of knee 09/21/2014   Hx of bacterial sinusitis 08/28/2014   Knee pain, acute 06/26/2014   Leg edema, left 06/20/2014   Varicose veins of bilateral lower extremities with other complications 06/20/2014   Varicose veins 05/03/2014   Inguinal adenopathy by ultrasound doppler 05/03/2014   Left leg swelling 04/25/2014   Diabetes mellitus type 2, controlled, without complications (HCC) 04/25/2014   Visit for preventive health examination 06/07/2012   Foot dermatitis 06/07/2012   Shortness of breath 11/06/2011   Nausea 11/06/2011   Hand eczema 06/27/2011   Prediabetes 03/20/2011   Preventative  health care 03/20/2011   Dry eyes    Headache 12/30/2010   Scoliosis    Osteopenia    History of ETOH abuse    Sinusitis, chronic 02/27/2010   LIBIDO, DECREASED 02/27/2010   GERD 02/20/2009   CONSTIPATION, CHRONIC 02/20/2009   SKIN CANCER, HX OF 02/20/2009   OSA (obstructive sleep apnea) 03/21/2008   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 Apolinar Eastern  REFERRING PROVIDER: Karenann Barnacle, PA-C  REFERRING DIAG: M54.2 (ICD-10-CM) - Cervicalgia M54.6 (ICD-10-CM) - Pain in thoracic spine Z98.1 (ICD-10-CM) - Arthrodesis status  Rationale for Evaluation and Treatment: Rehabilitation  THERAPY DIAG:  Other low back pain  S/P lumbar fusion  ONSET DATE: 09/07/2024  SUBJECTIVE:                                                                                                                                                                                           SUBJECTIVE STATEMENT: Pt had L3-5 lumbar fusion. Pt reports after surgery her symptoms in L LE have significantly improved. Patient had no BLT restrictions for 3 months but now she doesn't have any restrictions. Pt reports pain in the morning and in the evening that goes up to 4/10. Pt reports that she may have some increased urinal and bowel incontinence after surgery compared to before (she has not mentioned this to her MD).   PERTINENT HISTORY:  L TKA 02/03/23. PMH: SVT, ACDF (C3-4) ,DM    PAIN:  Are you having pain? Yes: NPRS scale: 4-5/10 Pain location: back pain Pain description: throbbing, tightness Aggravating factors: stiffness, prolonged movement Relieving factors: pain meds,  PRECAUTIONS: None  RED FLAGS: None   WEIGHT BEARING RESTRICTIONS: No  FALLS:  Has patient fallen in last 6 months? No   PLOF: Independent  PATIENT GOALS: Improve pain, improve ability to bend to pick things off the floor, improve ability to tie shoes.   OBJECTIVE:  Note: Objective measures were completed at Evaluation unless otherwise noted.  PATIENT SURVEYS:  Oswestry Low Back Pain Disability Questionnaire: 12/45 = 27%- moderate disability  COGNITION: Overall cognitive status: Within functional limits for tasks  assessed  SENSATION: WFL PALPATION: Edema present in lumbar spine  LUMBAR ROM:   AROM eval  Flexion 90% able to reach ankles bil  Extension 10% significantly limited  Right lateral flexion   Left lateral flexion   Right rotation   Left rotation    (Blank rows = not tested)  LOWER EXTREMITY ROM:     Active  Right eval Left eval  Hip flexion    Hip extension    Hip abduction    Hip adduction    Hip internal rotation    Hip external rotation    Knee flexion    Knee extension    Ankle dorsiflexion    Ankle plantarflexion    Ankle inversion    Ankle eversion     (Blank rows = not tested)  LOWER EXTREMITY MMT:    MMT Right eval Left eval  Hip flexion    Hip extension    Hip abduction    Hip adduction    Hip internal rotation    Hip external rotation    Knee flexion    Knee extension    Ankle dorsiflexion    Ankle plantarflexion    Ankle inversion    Ankle eversion     (Blank rows = not tested)  L TREATMENT DATE: 09/19/24                                                                                                                              Patient educated on limiting lifting to no more than 20 lbs  SL hip abductions: 2 x 10 R and L SL clamshells: 2 x 10 R and L Supine bridge: 2 x 10 Supine figure 4 piriformis stretch: 3 x 30  Seated figure 4 piriformis stretch: 3 x 30  PATIENT EDUCATION:  Education details: see above Person educated: Patient Education method: Explanation Education comprehension: verbalized understanding  HOME EXERCISE PROGRAM: Access Code: [email or web address] URL: [email or web address] Date: 09/19/2024 Prepared by: Raj Blanch  Exercises - Sidelying Hip Abduction  - 1 x daily - 7 x weekly - 2 sets - 10 reps - Clamshell  - 1 x daily - 7 x weekly - 2 sets - 10 reps - Supine Bridge  - 1 x daily - 7 x weekly - 2 sets - 10 reps - Seated Figure 4 Piriformis Stretch  - 1 x daily - 7 x weekly - 3 reps - 30 sec hold -  Supine Figure 4 Piriformis Stretch  - 1 x daily - 7 x weekly - 3 reps - 30 sec hold  ASSESSMENT:  CLINICAL IMPRESSION: Patient is a 73 y.o. female who was seen today for physical therapy evaluation and treatment for LBP s/p L3-5 fusion. Patient reports significant relief in her radicular symptoms. Patient continues to report ROM and pain impairments that limits patient from performing ADLs, self care, transfers, and prolonged standing/walking due to increased pain. Patient will benefit from skilled PT to address these impairments  and improve overall function by improving pain management, improve flexibility, and improve strength.   OBJECTIVE IMPAIRMENTS: Abnormal gait, decreased activity tolerance, decreased endurance, decreased mobility, difficulty walking, decreased ROM, decreased strength, hypomobility, increased edema, increased fascial restrictions, increased muscle spasms, impaired flexibility, improper body mechanics, and pain.   ACTIVITY LIMITATIONS: carrying, lifting, bending, standing, squatting, and bed mobility  PARTICIPATION LIMITATIONS: meal prep, cleaning, laundry, driving, shopping, and community activity  PERSONAL FACTORS: Age, Time since onset of injury/illness/exacerbation, and 1-2 comorbidities: Hx of lumbar fusion, hx cervical fusion, hx L TKA are also affecting patient's functional outcome.   REHAB POTENTIAL: Good  CLINICAL DECISION MAKING: Stable/uncomplicated  EVALUATION COMPLEXITY: Moderate   GOALS: Goals reviewed with patient? Yes  SHORT TERM GOALS: Target date: 10/17/2024   Patient will demo at least 50% compliance with HEP Baseline: initiated 09/19/24 Goal status: INITIAL   LONG TERM GOALS: Target date: 11/14/2024    Patient will report <2/10 pain at worst to improve pain with functional activities at home. Baseline: 4-5/10 Goal status: INITIAL  2.  Patient will demo good body mechanics that are painfree when picking things up from the floor. Baseline:   Goal status: INITIAL  3.  Pt will be able to pick up 20 lb kettle ball from floor with good body mechanics without pain. Baseline:  Goal status: INITIAL  4.  Pt will be able to perform floor to wait transfer with 20lbs with good body mechanis to improve ability to pick up grocery bags. Baseline:  Goal status: INITIAL  5.  Patient will demo 10% improvement on ODI to improve overall function Baseline: 12/45 = 27%- moderate disability Goal status: INITIAL   PLAN:  PT FREQUENCY: 2x/week  PT DURATION: 8 weeks  PLANNED INTERVENTIONS: [id number]- PT Re-evaluation, 97750- Physical Performance Testing, 97110-Therapeutic exercises, 97530- Therapeutic activity, 97112- Neuromuscular re-education, 97535- Self Care, [id number]- Manual therapy, [phone]- Gait training, [phone]- Aquatic Therapy, [phone]- Electrical stimulation (manual), Patient/Family education, Joint mobilization, Spinal mobilization, Scar mobilization, DME instructions, Cryotherapy, and Moist heat.  PLAN FOR NEXT SESSION: Review and progress HEP   Raj LOISE Blanch, PT 09/19/2024, 11:05 AM

## 2024-09-29 ENCOUNTER — Ambulatory Visit

## 2024-09-29 DIAGNOSIS — Z981 Arthrodesis status: Secondary | ICD-10-CM

## 2024-09-29 DIAGNOSIS — M5459 Other low back pain: Secondary | ICD-10-CM | POA: Diagnosis not present

## 2024-09-29 NOTE — Therapy (Signed)
 OUTPATIENT PHYSICAL THERAPY THORACOLUMBAR TREATMENT NOTE   Patient Name: Holly Beasley MRN: [id number] DOB:1951/09/21, 73 y.o., female Today's Date: 09/29/2024  END OF SESSION:  PT End of Session - 09/29/24 1155     Visit Number 2    Number of Visits 13    Date for Recertification  10/31/24    PT Start Time 1150    PT Stop Time 1235    PT Time Calculation (min) 45 min    Equipment Utilized During Treatment Gait belt    Activity Tolerance Patient tolerated treatment well    Behavior During Therapy WFL for tasks assessed/performed          Past Medical History:  Diagnosis Date   Allergy    seasonal,take shots   Cataract    bilateral,removed 2019   Depression    Diabetes mellitus without complication (HCC)    Dysrhythmia    SVTs   GERD (gastroesophageal reflux disease)    History of ETOH abuse    recovering  in remission   Hyperlipidemia    Hypertension    Osteoarthritis    Osteopenia    dexa -1.7 h -1.35    Scoliosis    mild scoliosis, leg length discrepancy   Skin cancer    basal cell nose   Sleep apnea    did not meet criteria for cpap-has mouthpiece not using per pt   Substance abuse (HCC)    etoh 40 years ago   SVT (supraventricular tachycardia) 08/15/2016   Varicose veins    Wears contact lenses    Past Surgical History:  Procedure Laterality Date   ANTERIOR FUSION CERVICAL SPINE     [id number]   BACK SURGERY  05/2024   fusion in L3,4,5   BREAST BIOPSY     2003-rt   CATARACT EXTRACTION, BILATERAL  2019   COLONOSCOPY W/ POLYPECTOMY  2004,2010, 11-2022   precancerous   2004   DENTAL SURGERY  2015   bone graft    KNEE ARTHROSCOPY WITH LATERAL MENISECTOMY Left 11/10/2014   Procedure: LEFT KNEE ARTHROSCOPY PARTIAL LATERAL MENISECTOMY CHONDROPLASTY;  Surgeon: Maude KANDICE Herald, MD;  Location: [facility];  Service: Orthopedics;  Laterality: Left;   NASAL SINUS SURGERY  11/13/2008   polyps removed   skin cancer removal     basal  cell nose 2004   TONSILLECTOMY AND ADENOIDECTOMY  1960   TOTAL KNEE ARTHROPLASTY Left 02/03/2023   Procedure: LEFT TOTAL KNEE ARTHROPLASTY;  Surgeon: Herald Maude, MD;  Location: [facility];  Service: Orthopedics;  Laterality: Left;   VENOUS ABLATION Left 2015   Dr Gerlean   Patient Active Problem List   Diagnosis Date Noted   Primary osteoarthritis of left knee 02/03/2023   HLD (hyperlipidemia) 04/14/2016   Essential hypertension 12/18/2014   Medication management 12/18/2014   Varicose veins of leg with complications 10/09/2014   Left knee pain 09/21/2014   Tear of meniscus of knee 09/21/2014   Hx of bacterial sinusitis 08/28/2014   Knee pain, acute 06/26/2014   Leg edema, left 06/20/2014   Varicose veins of bilateral lower extremities with other complications 06/20/2014   Varicose veins 05/03/2014   Inguinal adenopathy by ultrasound doppler 05/03/2014   Left leg swelling 04/25/2014   Diabetes mellitus type 2, controlled, without complications (HCC) 04/25/2014   Visit for preventive health examination 06/07/2012   Foot dermatitis 06/07/2012   Shortness of breath 11/06/2011   Nausea 11/06/2011   Hand eczema 06/27/2011   Prediabetes 03/20/2011  Preventative health care 03/20/2011   Dry eyes    Headache 12/30/2010   Scoliosis    Osteopenia    History of ETOH abuse    Sinusitis, chronic 02/27/2010   LIBIDO, DECREASED 02/27/2010   GERD 02/20/2009   CONSTIPATION, CHRONIC 02/20/2009   SKIN CANCER, HX OF 02/20/2009   OSA (obstructive sleep apnea) 03/21/2008   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 Apolinar Eastern  REFERRING PROVIDER: Karenann Barnacle, PA-C  REFERRING DIAG: M54.2 (ICD-10-CM) - Cervicalgia M54.6 (ICD-10-CM) - Pain in thoracic spine Z98.1 (ICD-10-CM) - Arthrodesis status  Rationale for Evaluation and Treatment: Rehabilitation  THERAPY DIAG:  Other low back pain  S/P lumbar fusion  ONSET DATE: 09/07/2024  SUBJECTIVE:                                                                                                                                                                                           SUBJECTIVE STATEMENT: Pt reports pain is worst in the morning when she wakes up which is about 5/10. Then as she walks around, she feels better. No significant pain right now.    EVALUATION: Pt had L3-5 lumbar fusion. Pt reports after surgery her symptoms in L LE have significantly improved. Patient had no BLT restrictions for 3 months but now she doesn't have any restrictions. Pt reports pain in the morning and in the evening that goes up to 4/10. Pt reports that she may have some increased urinal and bowel incontinence after surgery compared to before (she has not mentioned this to her MD).   PERTINENT HISTORY:  L TKA 02/03/23. PMH: SVT, ACDF (C3-4) ,DM    PAIN:  Are you having pain? Yes: NPRS scale: 4-5/10 Pain location: back pain Pain description: throbbing, tightness Aggravating factors: stiffness, prolonged movement Relieving factors: pain meds,  PRECAUTIONS: None  RED FLAGS: None   WEIGHT BEARING RESTRICTIONS: No  FALLS:  Has patient fallen in last 6 months? No   PLOF: Independent  PATIENT GOALS: Improve pain, improve ability to bend to pick things off the floor, improve ability to tie shoes.   OBJECTIVE:  Note: Objective measures  were completed at Evaluation unless otherwise noted.  PATIENT SURVEYS:   Oswestry Low Back Pain Disability Questionnaire: 12/45 = 27%- moderate disability  COGNITION: Overall cognitive status: Within functional limits for tasks assessed     SENSATION: WFL PALPATION: Edema present in lumbar spine  LUMBAR ROM:   AROM eval  Flexion 90% able to reach ankles bil  Extension 10% significantly limited  Right lateral flexion   Left lateral flexion   Right rotation   Left rotation    (Blank rows = not tested)  LOWER EXTREMITY ROM:     Active  Right eval Left eval  Hip flexion    Hip extension    Hip abduction    Hip adduction    Hip internal rotation    Hip external rotation    Knee flexion    Knee extension    Ankle dorsiflexion    Ankle plantarflexion    Ankle inversion    Ankle eversion     (Blank rows = not tested)  LOWER EXTREMITY MMT:    MMT Right eval Left eval  Hip flexion    Hip extension    Hip abduction    Hip adduction    Hip internal rotation    Hip external rotation    Knee flexion    Knee extension    Ankle dorsiflexion    Ankle plantarflexion    Ankle inversion    Ankle eversion     (Blank rows = not tested)  L TREATMENT DATE: 09/29/24                                                                                                                              Seated lumbar flexion: 10x Supine single knee to chest: 10 x 10 holds SL hip abductions: 3 x 10 R and L, 3lbs at knees SL clamshells: 3 x 10 R and L, 3lbs Supine bridge: 2 x 10 Supine figure 4 piriformis stretch: 3 x 30  Seated figure 4 piriformis stretch: 3 x 30 Therapeutic activities: Pt asked on how she removes pots and pans from bottom. Pt demo semi squat with bending over but almost lost her balance when removing pots and pans with both hands as she was not able to hold on. - PT educated on getting 13-16  (16 preferred) stool where she can sit on when bringing pots and pans from the bottom shelf on the cabinet. We practiced sitting on 15 high  stool in the therapy where patient removed pile of towels with bil UE and moved them to counter and then used bil UE to hold countertop to stand up. First tried 13 stool but she had difficulty with knee pain when standing up. She preferred 15 stool.  _ we also discussed on how to do laundry. She has front loader washer and dryer. Washer is on bottom and dryer is on top. Pt educated to sit on the stool as she loads or  unloads washer. Pt asked to place clothes from washer onto door of the washer and then standing up to move clothes into dryer.   Pt verbalized understanding and given patient resources to purchase folding step stool which she can use to sit on.    PATIENT EDUCATION:  Education details: see above Person educated: Patient Education method: Explanation Education comprehension: verbalized understanding  HOME EXERCISE PROGRAM: Access Code: [email or web address] URL: [email or web address] Date: 09/19/2024 Prepared by: Raj Blanch  Exercises - Sidelying Hip Abduction  - 1 x daily - 7 x weekly - 2 sets - 10 reps - Clamshell  - 1 x daily - 7 x weekly - 2 sets - 10 reps - Supine Bridge  - 1 x daily - 7 x weekly - 2 sets - 10 reps - Seated Figure 4 Piriformis Stretch  - 1 x daily - 7 x weekly - 3 reps - 30 sec hold - Supine Figure 4 Piriformis Stretch  - 1 x daily - 7 x weekly - 3 reps - 30 sec hold  ASSESSMENT:  CLINICAL IMPRESSION: Today's session emphasis placed on reviewing HEP from first session and progressing stretches and strengthening exercises.   OBJECTIVE IMPAIRMENTS: Abnormal gait, decreased activity tolerance, decreased endurance, decreased mobility, difficulty walking, decreased ROM, decreased strength, hypomobility, increased edema, increased fascial restrictions, increased muscle spasms, impaired flexibility, improper body mechanics, and pain.   ACTIVITY LIMITATIONS: carrying, lifting, bending, standing, squatting, and bed mobility  PARTICIPATION  LIMITATIONS: meal prep, cleaning, laundry, driving, shopping, and community activity  PERSONAL FACTORS: Age, Time since onset of injury/illness/exacerbation, and 1-2 comorbidities: Hx of lumbar fusion, hx cervical fusion, hx L TKA are also affecting patient's functional outcome.   REHAB POTENTIAL: Good  CLINICAL DECISION MAKING: Stable/uncomplicated  EVALUATION COMPLEXITY: Moderate   GOALS: Goals reviewed with patient? Yes  SHORT TERM GOALS: Target date: 10/17/2024   Patient will demo at least 50% compliance with HEP Baseline: initiated 09/19/24 Goal status: INITIAL   LONG TERM GOALS: Target date: 11/14/2024    Patient will report <2/10 pain at worst to improve pain with functional activities at home. Baseline: 4-5/10 Goal status: INITIAL  2.  Patient will demo good body mechanics that are painfree when picking things up from the floor. Baseline:  Goal status: INITIAL  3.  Pt will be able to pick up 20 lb kettle ball from floor with good body mechanics without pain. Baseline:  Goal status: INITIAL  4.  Pt will be able to perform floor to wait transfer with 20lbs with good body mechanis to improve ability to pick up grocery bags. Baseline:  Goal status: INITIAL  5.  Patient will demo 10% improvement on ODI to improve overall function Baseline: 12/45 = 27%- moderate disability Goal status: INITIAL   PLAN:  PT FREQUENCY: 2x/week  PT DURATION: 8 weeks  PLANNED INTERVENTIONS: 97164- PT Re-evaluation, 97750- Physical Performance Testing, 97110-Therapeutic exercises, 97530- Therapeutic activity, 97112- Neuromuscular re-education, 97535- Self Care, 02859- Manual therapy, [phone]- Gait training, [phone]- Aquatic Therapy, [phone]- Electrical stimulation (manual), Patient/Family education, Joint mobilization, Spinal mobilization, Scar mobilization, DME instructions, Cryotherapy, and Moist heat.  PLAN FOR NEXT SESSION: Review and progress HEP   Raj LOISE Blanch, PT 09/29/2024,  11:56 AM

## 2024-10-03 ENCOUNTER — Ambulatory Visit

## 2024-10-03 DIAGNOSIS — Z1231 Encounter for screening mammogram for malignant neoplasm of breast: Secondary | ICD-10-CM

## 2024-10-04 ENCOUNTER — Ambulatory Visit

## 2024-10-04 DIAGNOSIS — M5459 Other low back pain: Secondary | ICD-10-CM

## 2024-10-04 DIAGNOSIS — Z981 Arthrodesis status: Secondary | ICD-10-CM

## 2024-10-04 NOTE — Therapy (Signed)
 " OUTPATIENT PHYSICAL THERAPY THORACOLUMBAR TREATMENT NOTE   Patient Name: Holly Beasley MRN: [id number] DOB:[birth year], 73 y.o., female Today's Date: 10/04/2024  END OF SESSION:  PT End of Session - 10/04/24 1108     Visit Number 3    Number of Visits 13    Date for Recertification  10/31/24    PT Start Time 1100    PT Stop Time 1145    PT Time Calculation (min) 45 min    Equipment Utilized During Treatment Gait belt    Activity Tolerance Patient tolerated treatment well    Behavior During Therapy WFL for tasks assessed/performed          Past Medical History:  Diagnosis Date   Allergy    seasonal,take shots   Cataract    bilateral,removed 2019   Depression    Diabetes mellitus without complication (HCC)    Dysrhythmia    SVTs   GERD (gastroesophageal reflux disease)    History of ETOH abuse    recovering  in remission   Hyperlipidemia    Hypertension    Osteoarthritis    Osteopenia    dexa -1.7 h -1.35    Scoliosis    mild scoliosis, leg length discrepancy   Skin cancer    basal cell nose   Sleep apnea    did not meet criteria for cpap-has mouthpiece not using per pt   Substance abuse (HCC)    etoh 40 years ago   SVT (supraventricular tachycardia) 08/15/2016   Varicose veins    Wears contact lenses    Past Surgical History:  Procedure Laterality Date   ANTERIOR FUSION CERVICAL SPINE     [phone]   BACK SURGERY  05/2024   fusion in L3,4,5   BREAST BIOPSY     2003-rt   CATARACT EXTRACTION, BILATERAL  2019   COLONOSCOPY W/ POLYPECTOMY  2004,2010, 11-2022   precancerous   2004   DENTAL SURGERY  2015   bone graft    KNEE ARTHROSCOPY WITH LATERAL MENISECTOMY Left 11/10/2014   Procedure: LEFT KNEE ARTHROSCOPY PARTIAL LATERAL MENISECTOMY CHONDROPLASTY;  Surgeon: Maude KANDICE Herald, MD;  Location: [facility];  Service: Orthopedics;  Laterality: Left;   NASAL SINUS SURGERY  11/13/2008   polyps removed   skin cancer removal     basal  cell nose 2004   TONSILLECTOMY AND ADENOIDECTOMY  1960   TOTAL KNEE ARTHROPLASTY Left 02/03/2023   Procedure: LEFT TOTAL KNEE ARTHROPLASTY;  Surgeon: Herald Maude, MD;  Location: [facility];  Service: Orthopedics;  Laterality: Left;   VENOUS ABLATION Left 2015   Dr Gerlean   Patient Active Problem List   Diagnosis Date Noted   Primary osteoarthritis of left knee 02/03/2023   HLD (hyperlipidemia) 04/14/2016   Essential hypertension 12/18/2014   Medication management 12/18/2014   Varicose veins of leg with complications 10/09/2014   Left knee pain 09/21/2014   Tear of meniscus of knee 09/21/2014   Hx of bacterial sinusitis 08/28/2014   Knee pain, acute 06/26/2014   Leg edema, left 06/20/2014   Varicose veins of bilateral lower extremities with other complications 06/20/2014   Varicose veins 05/03/2014   Inguinal adenopathy by ultrasound doppler 05/03/2014   Left leg swelling 04/25/2014   Diabetes mellitus type 2, controlled, without complications (HCC) 04/25/2014   Visit for preventive health examination 06/07/2012   Foot dermatitis 06/07/2012   Shortness of breath 11/06/2011   Nausea 11/06/2011   Hand eczema 06/27/2011   Prediabetes 03/20/2011  Preventative health care 03/20/2011   Dry eyes    Headache 12/30/2010   Scoliosis    Osteopenia    History of ETOH abuse    Sinusitis, chronic 02/27/2010   LIBIDO, DECREASED 02/27/2010   GERD 02/20/2009   CONSTIPATION, CHRONIC 02/20/2009   SKIN CANCER, HX OF 02/20/2009   OSA (obstructive sleep apnea) 03/21/2008   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 Apolinar Eastern  REFERRING PROVIDER: Karenann Barnacle, PA-C  REFERRING DIAG: M54.2 (ICD-10-CM) - Cervicalgia M54.6 (ICD-10-CM) - Pain in thoracic spine Z98.1 (ICD-10-CM) - Arthrodesis status  Rationale for Evaluation and Treatment: Rehabilitation  THERAPY DIAG:  Other low back pain  S/P lumbar fusion  ONSET DATE: 09/07/2024  SUBJECTIVE:                                                                                                                                                                                           SUBJECTIVE STATEMENT: Pt reports currently pain is 2/10. She did get the stool but has not used it. Pt reports she was very stiff in the hips yesterday and she did some exercises with trainer yesterday.   EVALUATION: Pt had L3-5 lumbar fusion. Pt reports after surgery her symptoms in L LE have significantly improved. Patient had no BLT restrictions for 3 months but now she doesn't have any restrictions. Pt reports pain in the morning and in the evening that goes up to 4/10. Pt reports that she may have some increased urinal and bowel incontinence after surgery compared to before (she has not mentioned this to her MD).   PERTINENT HISTORY:  L TKA 02/03/23. PMH: SVT, ACDF (C3-4) ,DM    PAIN:  Are you having pain? Yes: NPRS scale: 4-5/10 Pain location: back pain Pain description: throbbing, tightness Aggravating factors: stiffness, prolonged movement Relieving factors: pain meds,  PRECAUTIONS: None  RED FLAGS: None   WEIGHT BEARING RESTRICTIONS: No  FALLS:  Has patient fallen in last 6 months? No   PLOF: Independent  PATIENT GOALS: Improve pain, improve ability to bend to pick things off the floor, improve ability to tie shoes.   OBJECTIVE:  Note: Objective measures were completed at Evaluation unless otherwise  noted.  PATIENT SURVEYS:  Oswestry Low Back Pain Disability Questionnaire: 12/45 = 27%- moderate disability  COGNITION: Overall cognitive status: Within functional limits for tasks assessed     SENSATION: WFL PALPATION: Edema present in lumbar spine  LUMBAR ROM:   AROM eval  Flexion 90% able to reach ankles bil  Extension 10% significantly limited  Right lateral flexion   Left lateral flexion   Right rotation   Left rotation    (Blank rows = not tested)  LOWER EXTREMITY ROM:     Active  Right eval Left eval  Hip flexion    Hip extension    Hip abduction    Hip adduction    Hip internal rotation    Hip external rotation    Knee flexion    Knee extension    Ankle dorsiflexion    Ankle plantarflexion    Ankle inversion    Ankle eversion     (Blank rows = not tested)  LOWER EXTREMITY MMT:    MMT Right eval Left eval  Hip flexion    Hip extension    Hip abduction    Hip adduction    Hip internal rotation    Hip external rotation    Knee flexion    Knee extension    Ankle dorsiflexion    Ankle plantarflexion    Ankle inversion    Ankle eversion     (Blank rows = not tested)  L TREATMENT DATE: PROM of R hip limited to 90 deg with soft tissue end feel causing pain, 0 deg hip adduction at 90 deg flexion with limited by pain. L hip flexion and adduction (at 90 deg flexion) was [employer] but with pain at end range of motion. Soft tissue massage to proximal hip flexors tendon bil PROM of bil hip into flexion, adduction, abduction, IR and ER Supine unilateral resisted hip flexion in hooklying: red sport cord: 20x R and L Supine unilateral hooklying hip adduction: black sport cord: 20x R and L Sit to stand: 16 box: 10x with bil UE support Supine ab bracing with double knee to chest with 3kg ball between knees: 2 x 10 After manual therapy, R hip PROM improved to 130 deg flexion and 40 deg adduction with mild pain at end range of motion. Similar improvement noted in  L hip with improved end range pain      PATIENT EDUCATION:  Education details: see above Person educated: Patient Education method: Explanation Education comprehension: verbalized understanding  HOME EXERCISE PROGRAM: Access Code: [email or web address] URL: [email or web address] Date: 09/19/2024 Prepared by: Raj Blanch  Exercises - Sidelying Hip Abduction  - 1 x daily - 7 x weekly - 2 sets - 10 reps - Clamshell  - 1 x daily - 7 x weekly - 2 sets - 10 reps - Supine Bridge  - 1 x daily - 7 x weekly - 2 sets - 10 reps - Seated Figure 4 Piriformis Stretch  - 1 x daily - 7 x weekly - 3 reps - 30 sec hold - Supine Figure 4 Piriformis Stretch  - 1 x daily - 7 x weekly - 3 reps - 30 sec hold  ASSESSMENT:  CLINICAL IMPRESSION: Today's session emphasis placed improving bil hip mobility and LE strength. Patient demo improved hip ROM and pain after manual therapy and exercises. Pt demo weakness in bil LE with squats below 75 deg knee flexion.  OBJECTIVE IMPAIRMENTS: Abnormal gait, decreased activity  tolerance, decreased endurance, decreased mobility, difficulty walking, decreased ROM, decreased strength, hypomobility, increased edema, increased fascial restrictions, increased muscle spasms, impaired flexibility, improper body mechanics, and pain.   ACTIVITY LIMITATIONS: carrying, lifting, bending, standing, squatting, and bed mobility  PARTICIPATION LIMITATIONS: meal prep, cleaning, laundry, driving, shopping, and community activity  PERSONAL FACTORS: Age, Time since onset of injury/illness/exacerbation, and 1-2 comorbidities: Hx of lumbar fusion, hx cervical fusion, hx L TKA are also affecting patient's functional outcome.   REHAB POTENTIAL: Good  CLINICAL DECISION MAKING: Stable/uncomplicated  EVALUATION COMPLEXITY: Moderate   GOALS: Goals reviewed with patient? Yes  SHORT TERM GOALS: Target date: 10/17/2024   Patient will demo at least 50% compliance with HEP Baseline:  Goal met 10/04/24 Goal status: INITIAL   LONG TERM GOALS: Target date: 11/14/2024    Patient will report <2/10 pain at worst to improve pain with functional activities at home. Baseline: 4-5/10 (Eval); 2/10 (10/04/24) Goal status: progressing  2.  Patient will demo good body mechanics that are painfree when picking things up from the floor. Baseline:  Goal status: INITIAL  3.  Pt will be able to pick up 20 lb kettle ball from floor with good body mechanics without pain. Baseline:  Goal status: INITIAL  4.  Pt will be able to perform floor to wait transfer with 20lbs with good body mechanis to improve ability to pick up grocery bags. Baseline:  Goal status: INITIAL  5.  Patient will demo 10% improvement on ODI to improve overall function Baseline: 12/45 = 27%- moderate disability Goal status: INITIAL   PLAN:  PT FREQUENCY: 2x/week  PT DURATION: 8 weeks  PLANNED INTERVENTIONS: 97164- PT Re-evaluation, 97750- Physical Performance Testing, 97110-Therapeutic exercises, 97530- Therapeutic activity, 97112- Neuromuscular re-education, 97535- Self Care, 02859- Manual therapy, [id number]- Gait training, [phone]- Aquatic Therapy, [phone]- Electrical stimulation (manual), Patient/Family education, Joint mobilization, Spinal mobilization, Scar mobilization, DME instructions, Cryotherapy, and Moist heat.  PLAN FOR NEXT SESSION: Review and progress HEP   Raj LOISE Blanch, PT 10/04/2024, 12:14 PM  "

## 2024-10-05 ENCOUNTER — Other Ambulatory Visit: Payer: Self-pay | Admitting: Internal Medicine

## 2024-10-09 ENCOUNTER — Other Ambulatory Visit: Payer: Self-pay | Admitting: Internal Medicine

## 2024-10-18 ENCOUNTER — Ambulatory Visit: Payer: Self-pay

## 2024-10-18 NOTE — Progress Notes (Signed)
 "  Chief Complaint  Patient presents with   Medical Management of Chronic Issues    HPI: Holly Beasley 74 y.o. come in for Chronic disease management  Fatigue recently    and ocass  rapid rate  and lasts  seconds  Sleep wakenings   take naps rest .   Hx of  back surgery . Just starting  PT  Father passes days before surgery. Adopted cats after cat died t giving  Husband  has some memory issues .  DM: 7.1   HT: Allergy under care  Decompression surgery   to start activity  now post op.   ROS: See pertinent positives and negatives per HPI.  Past Medical History:  Diagnosis Date   Allergy    seasonal,take shots   Cataract    bilateral,removed 2019   Depression    Diabetes mellitus without complication (HCC)    Dysrhythmia    SVTs   GERD (gastroesophageal reflux disease)    History of ETOH abuse    recovering  in remission   Hyperlipidemia    Hypertension    Osteoarthritis    Osteopenia    dexa -1.7 h -1.35    Scoliosis    mild scoliosis, leg length discrepancy   Skin cancer    basal cell nose   Sleep apnea    did not meet criteria for cpap-has mouthpiece not using per pt   Substance abuse (HCC)    etoh 40 years ago   SVT (supraventricular tachycardia) 08/15/2016   Varicose veins    Wears contact lenses     Family History  Problem Relation Age of Onset   Lymphoma Mother    Diabetes Mother    Hyperlipidemia Mother    Colon polyps Mother    Heart failure Father    Hyperlipidemia Father    Arthritis Brother    Alcohol abuse Brother    Alcohol abuse Brother    Colon cancer Neg Hx    Esophageal cancer Neg Hx    Rectal cancer Neg Hx    Stomach cancer Neg Hx    Crohn's disease Neg Hx    Ulcerative colitis Neg Hx    BRCA 1/2 Neg Hx    Breast cancer Neg Hx     Social History   Socioeconomic History   Marital status: Married    Spouse name: Not on file   Number of children: Not on file   Years of education: Not on file   Highest education level: Not  on file  Occupational History   Not on file  Tobacco Use   Smoking status: Never   Smokeless tobacco: Never  Vaping Use   Vaping status: Never Used  Substance and Sexual Activity   Alcohol use: No    Alcohol/week: 0.0 standard drinks of alcohol    Comment: recovering-1983   Drug use: No   Sexual activity: Not Currently  Other Topics Concern   Not on file  Social History Narrative   2 cats   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    Retired equities trader with [employer]   Married   ETOH recovering   Hhof 2    Has family in [place]         Social Drivers of Health   Tobacco Use: Low Risk (10/04/2024)   Patient History    Smoking Tobacco Use: Never    Smokeless Tobacco Use: Never  Passive Exposure: Not on file  Financial Resource Strain: Low Risk (05/24/2024)   Received from [facility]   Overall Financial Resource Strain (CARDIA)    How hard is it for you to pay for the very basics like food, housing, medical care, and heating?: Not hard at all  Food Insecurity: No Food Insecurity (03/02/2024)   Received from [facility]   Epic    Within the past 12 months, you worried that your food would run out before you got the money to buy more.: Never true    Within the past 12 months, the food you bought just didn't last and you didn't have money to get more.: Never true  Transportation Needs: No Transportation Needs (03/02/2024)   Received from [facility] - Transportation    Lack of Transportation (Medical): No    Lack of Transportation (Non-Medical): No  Physical Activity: Not on file  Stress: No Stress Concern Present (05/23/2024)   Received from [facility] - Occupational Stress Questionnaire    Do you feel stress - tense, restless, nervous, or anxious, or unable to sleep at night because your mind is troubled all the time - these days?: Only a little  Social Connections:  Not on file  Depression (PHQ2-9): Low Risk (10/19/2024)   Depression (PHQ2-9)    PHQ-2 Score: 1  Alcohol Screen: Not on file  Housing: Low Risk (03/02/2024)   Received from [facility]    In the last 12 months, was there a time when you were not able to pay the mortgage or rent on time?: No    In the past 12 months, how many times have you moved where you were living?: 0    At any time in the past 12 months, were you homeless or living in a shelter (including now)?: No  Utilities: Not At Risk (03/02/2024)   Received from [facility] Utilities    Threatened with loss of utilities: No  Health Literacy: Not on file    Outpatient Medications Prior to Visit  Medication Sig Dispense Refill   atorvastatin  (LIPITOR) 40 MG tablet TAKE 1 TABLET BY MOUTH EVERY DAY 90 tablet 0   azelastine (ASTELIN) 0.1 % nasal spray Place 2 sprays into both nostrils in the morning.     carvedilol  (COREG ) 12.5 MG tablet Take 1 tablet (12.5 mg total) by mouth 2 (two) times daily. 180 tablet 3   cetirizine (ZYRTEC) 10 MG tablet Take 10 mg by mouth at bedtime.     Cholecalciferol (VITAMIN D3) 25 MCG (1000 UT) capsule Take 1,000 Units by mouth daily.     Cyanocobalamin  (B-12) 1000 MCG TABS Take 2,500 mcg by mouth daily.     dapagliflozin  propanediol (FARXIGA ) 10 MG TABS tablet Take 1 tablet (10 mg total) by mouth daily before breakfast. Dosage change 90 tablet 0   DULoxetine  (CYMBALTA ) 60 MG capsule TAKE 2 CAPSULES (120 MG TOTAL) BY MOUTH AT BEDTIME. 180 capsule 1   EPINEPHrine  0.3 mg/0.3 mL IJ SOAJ injection Inject 0.3 mg into the muscle as needed for anaphylaxis.     hydrochlorothiazide  (MICROZIDE ) 12.5 MG capsule TAKE 1 CAPSULE BY MOUTH EVERY DAY 90 capsule 1   losartan  (COZAAR ) 100 MG tablet Take 1 tablet (100 mg total) by mouth daily. 90 tablet 3   losartan  (COZAAR ) 50 MG tablet TAKE 1 TABLET BY MOUTH EVERY DAY 90 tablet 3   Magnesium 400 MG  TABS Take 400 mg by mouth daily.     metFORMIN   (GLUCOPHAGE ) 500 MG tablet TAKE 4 TABLETS BY MOUTH EVERY DAY WITH A MEAL 360 tablet 1   montelukast  (SINGULAIR ) 10 MG tablet TAKE 1 TABLET BY MOUTH EVERYDAY AT BEDTIME 90 tablet 0   Multiple Vitamins-Minerals (MULTIVITAMIN GUMMIES ADULTS PO) Take by mouth daily. Take 2 daily     omeprazole  (PRILOSEC) 40 MG capsule TAKE 1 CAPSULE BY MOUTH EVERY DAY 90 capsule 0   Oxycodone  HCl 10 MG TABS Take 10 mg by mouth every 4 (four) hours as needed.     Blood Glucose Monitoring Suppl (ACCU-CHEK GUIDE ME) w/Device KIT Use as instructed (Patient not taking: Reported on 10/19/2024) 1 kit 0   No facility-administered medications prior to visit.     EXAM:  BP (!) 142/78 (BP Location: Right Arm, Patient Position: Sitting, Cuff Size: Large)   Pulse 75   Temp 97.7 F (36.5 C) (Oral)   Ht 5' 8 (1.727 m)   Wt 160 lb 9.6 oz (72.8 kg)   SpO2 96%   BMI 24.42 kg/m   Body mass index is 24.42 kg/m.  GENERAL: vitals reviewed and listed above, alert, oriented, appears well hydrated and in no acute distress HEENT: atraumatic, conjunctiva  clear, no obvious abnormalities on inspection of external nose and ears OP : no lesion edema or exudate  NECK: no obvious masses on inspection palpation  LUNGS: clear to auscultation bilaterally, no wheezes, rales or rhonchi, good air movement CV: HRRR, no clubbing cyanosis or  peripheral edema nl cap refill  Abdomen:  Sof,t normal bowel sounds without hepatosplenomegaly, no guarding rebound or masses no CVA tenderness MS: moves all extremities without noticeable focal  abnormality PSYCH: pleasant and cooperative, no obvious depression or anxiety Lab Results  Component Value Date   WBC 7.0 05/10/2024   HGB 12.8 05/10/2024   HCT 40 05/10/2024   PLT 395.0 04/14/2023   GLUCOSE 133 (H) 03/23/2024   CHOL 163 04/14/2023   TRIG 83.0 04/14/2023   HDL 57.20 04/14/2023   LDLDIRECT 171.0 10/10/2016   LDLCALC 89 04/14/2023   ALT 24 05/10/2024   AST 21 05/10/2024   NA 136 (A)  05/10/2024   K 4.3 05/10/2024   CL 100 05/10/2024   CREATININE 0.9 05/10/2024   BUN 20 05/10/2024   CO2 25 (A) 05/10/2024   TSH 1.40 10/22/2023   HGBA1C 7.1 (A) 10/19/2024   BP Readings from Last 3 Encounters:  10/19/24 (!) 142/78  07/19/24 130/74  05/19/24 124/74   Due lipids check  ASSESSMENT AND PLAN:  Discussed the following assessment and plan:  Other fatigue  Type 2 diabetes mellitus with hyperglycemia, without long-term current use of insulin (HCC) - Plan: POC HgB A1c  Medication management  Essential hypertension  S/P lumbar fusion July 25  SVT (supraventricular tachycardia) - his    minimal sx   recently  may need reevaluation  Need for pneumococcal vaccine - Plan: Pneumococcal conjugate vaccine 20-valent (Prevnar 20) Update  parameters disc  prevention   Exam reassuring    -Patient advised to return or notify health care team  if  new concerns arise.  Patient Instructions  Heart sounds normal today  Agree get with new cardiologist  Please make fasting lab appt   ( orders in system noted)  A1c is  7.1 today   Fatigue may be multifactorial   .   Plan cpe /rov in 3-4 months or as needed .  Mansour Balboa K. Commodore Bellew M.D. "

## 2024-10-19 ENCOUNTER — Ambulatory Visit: Payer: Self-pay | Admitting: Internal Medicine

## 2024-10-19 VITALS — BP 142/78 | HR 75 | Temp 97.7°F | Ht 68.0 in | Wt 160.6 lb

## 2024-10-19 DIAGNOSIS — Z981 Arthrodesis status: Secondary | ICD-10-CM | POA: Diagnosis not present

## 2024-10-19 DIAGNOSIS — Z23 Encounter for immunization: Secondary | ICD-10-CM

## 2024-10-19 DIAGNOSIS — I471 Supraventricular tachycardia, unspecified: Secondary | ICD-10-CM | POA: Diagnosis not present

## 2024-10-19 DIAGNOSIS — I10 Essential (primary) hypertension: Secondary | ICD-10-CM | POA: Diagnosis not present

## 2024-10-19 DIAGNOSIS — E1165 Type 2 diabetes mellitus with hyperglycemia: Secondary | ICD-10-CM

## 2024-10-19 DIAGNOSIS — Z79899 Other long term (current) drug therapy: Secondary | ICD-10-CM | POA: Diagnosis not present

## 2024-10-19 DIAGNOSIS — R5383 Other fatigue: Secondary | ICD-10-CM

## 2024-10-19 LAB — POCT GLYCOSYLATED HEMOGLOBIN (HGB A1C): Hemoglobin A1C: 7.1 % — AB (ref 4.0–5.6)

## 2024-10-19 NOTE — Patient Instructions (Signed)
 Heart sounds normal today  Agree get with new cardiologist  Please make fasting lab appt   ( orders in system noted)  A1c is  7.1 today   Fatigue may be multifactorial   .   Plan cpe /rov in 3-4 months or as needed .

## 2024-10-20 ENCOUNTER — Ambulatory Visit: Payer: Self-pay | Attending: Orthopedic Surgery

## 2024-10-20 DIAGNOSIS — Z981 Arthrodesis status: Secondary | ICD-10-CM | POA: Diagnosis present

## 2024-10-20 DIAGNOSIS — M5459 Other low back pain: Secondary | ICD-10-CM | POA: Diagnosis present

## 2024-10-20 NOTE — Therapy (Unsigned)
 " OUTPATIENT PHYSICAL THERAPY THORACOLUMBAR TREATMENT NOTE   Patient Name: Holly Beasley MRN: [id number] DOB:Sep 07, 1951, 74 y.o., female Today's Date: 10/20/2024  END OF SESSION:  PT End of Session - 10/20/24 1315     Visit Number 4    Number of Visits 13    Date for Recertification  10/31/24    PT Start Time 1315    PT Stop Time 1400    PT Time Calculation (min) 45 min    Equipment Utilized During Treatment Gait belt    Activity Tolerance Patient tolerated treatment well    Behavior During Therapy WFL for tasks assessed/performed          Past Medical History:  Diagnosis Date   Allergy    seasonal,take shots   Cataract    bilateral,removed 2019   Depression    Diabetes mellitus without complication (HCC)    Dysrhythmia    SVTs   GERD (gastroesophageal reflux disease)    History of ETOH abuse    recovering  in remission   Hyperlipidemia    Hypertension    Osteoarthritis    Osteopenia    dexa -1.7 h -1.35    Scoliosis    mild scoliosis, leg length discrepancy   Skin cancer    basal cell nose   Sleep apnea    did not meet criteria for cpap-has mouthpiece not using per pt   Substance abuse (HCC)    etoh 40 years ago   SVT (supraventricular tachycardia) 08/15/2016   Varicose veins    Wears contact lenses    Past Surgical History:  Procedure Laterality Date   ANTERIOR FUSION CERVICAL SPINE     [phone]   BACK SURGERY  05/2024   fusion in L3,4,5   BREAST BIOPSY     2003-rt   CATARACT EXTRACTION, BILATERAL  2019   COLONOSCOPY W/ POLYPECTOMY  2004,2010, 11-2022   precancerous   2004   DENTAL SURGERY  2015   bone graft    KNEE ARTHROSCOPY WITH LATERAL MENISECTOMY Left 11/10/2014   Procedure: LEFT KNEE ARTHROSCOPY PARTIAL LATERAL MENISECTOMY CHONDROPLASTY;  Surgeon: Maude KANDICE Herald, MD;  Location: [facility];  Service: Orthopedics;  Laterality: Left;   NASAL SINUS SURGERY  11/13/2008   polyps removed   skin cancer removal     basal cell  nose 2004   TONSILLECTOMY AND ADENOIDECTOMY  1960   TOTAL KNEE ARTHROPLASTY Left 02/03/2023   Procedure: LEFT TOTAL KNEE ARTHROPLASTY;  Surgeon: Herald Maude, MD;  Location: [facility];  Service: Orthopedics;  Laterality: Left;   VENOUS ABLATION Left 2015   Dr Gerlean   Patient Active Problem List   Diagnosis Date Noted   Primary osteoarthritis of left knee 02/03/2023   HLD (hyperlipidemia) 04/14/2016   Essential hypertension 12/18/2014   Medication management 12/18/2014   Varicose veins of leg with complications 10/09/2014   Left knee pain 09/21/2014   Tear of meniscus of knee 09/21/2014   Hx of bacterial sinusitis 08/28/2014   Knee pain, acute 06/26/2014   Leg edema, left 06/20/2014   Varicose veins of bilateral lower extremities with other complications 06/20/2014   Varicose veins 05/03/2014   Inguinal adenopathy by ultrasound doppler 05/03/2014   Left leg swelling 04/25/2014   Diabetes mellitus type 2, controlled, without complications (HCC) 04/25/2014   Visit for preventive health examination 06/07/2012   Foot dermatitis 06/07/2012   Shortness of breath 11/06/2011   Nausea 11/06/2011   Hand eczema 06/27/2011   Prediabetes 03/20/2011  Preventative health care 03/20/2011   Dry eyes    Headache 12/30/2010   Scoliosis    Osteopenia    History of ETOH abuse    Sinusitis, chronic 02/27/2010   LIBIDO, DECREASED 02/27/2010   GERD 02/20/2009   CONSTIPATION, CHRONIC 02/20/2009   SKIN CANCER, HX OF 02/20/2009   OSA (obstructive sleep apnea) 03/21/2008   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 Apolinar Eastern  REFERRING PROVIDER: Karenann Barnacle, PA-C  REFERRING DIAG: M54.2 (ICD-10-CM) - Cervicalgia M54.6 (ICD-10-CM) - Pain in thoracic spine Z98.1 (ICD-10-CM) - Arthrodesis status  Rationale for Evaluation and Treatment: Rehabilitation  THERAPY DIAG:  Other low back pain  S/P lumbar fusion  ONSET DATE: 09/07/2024  SUBJECTIVE:                                                                                                                                                                                           SUBJECTIVE STATEMENT: Pt reports she was inconsistent with exercises due to holidays. Stffness is worst in the morning, gets better and gets worst at end of the day. Pt is trying to cut down meidication as she can. Pain is 5/10 in the morning, middle of the day is 1-2/10, in the eventing, it is 5/10.   EVALUATION: Pt had L3-5 lumbar fusion. Pt reports after surgery her symptoms in L LE have significantly improved. Patient had no BLT restrictions for 3 months but now she doesn't have any restrictions. Pt reports pain in the morning and in the evening that goes up to 4/10. Pt reports that she may have some increased urinal and bowel incontinence after surgery compared to before (she has not mentioned this to her MD).   PERTINENT HISTORY:  L TKA 02/03/23. PMH: SVT, ACDF (C3-4) ,DM    PAIN:  Are you having pain? Yes: NPRS scale: 4-5/10 Pain location: back pain Pain description: throbbing, tightness Aggravating factors: stiffness, prolonged movement Relieving factors: pain meds,  PRECAUTIONS: None  RED FLAGS: None   WEIGHT BEARING RESTRICTIONS: No  FALLS:  Has patient fallen in last 6 months? No   PLOF: Independent  PATIENT GOALS:  Improve pain, improve ability to bend to pick things off the floor, improve ability to  tie shoes.   OBJECTIVE:  Note: Objective measures were completed at Evaluation unless otherwise noted.  PATIENT SURVEYS:  Oswestry Low Back Pain Disability Questionnaire: 12/45 = 27%- moderate disability  COGNITION: Overall cognitive status: Within functional limits for tasks assessed     SENSATION: WFL PALPATION: Edema present in lumbar spine  LUMBAR ROM:   AROM eval  Flexion 90% able to reach ankles bil  Extension 10% significantly limited  Right lateral flexion   Left lateral flexion   Right rotation   Left rotation    (Blank rows = not tested)  LOWER EXTREMITY ROM:     Active  Right eval Left eval  Hip flexion    Hip extension    Hip abduction    Hip adduction    Hip internal rotation    Hip external rotation    Knee flexion    Knee extension    Ankle dorsiflexion    Ankle plantarflexion    Ankle inversion    Ankle eversion     (Blank rows = not tested)  LOWER EXTREMITY MMT:    MMT Right eval Left eval  Hip flexion    Hip extension    Hip abduction    Hip adduction    Hip internal rotation    Hip external rotation    Knee flexion    Knee extension    Ankle dorsiflexion    Ankle plantarflexion    Ankle inversion    Ankle eversion     (Blank rows = not tested)  L TREATMENT DATE: STM to bil thoracilumbar praspinalis to relieve tension and mm spasms. Patient has increased tightness in lumbar paraspinalis and multifidus Pt educated on hydrating better Pt educated that she may experience soreness for next 2-3 days after deep tissue work today and educated on heat/ice and OTC pain meds to help her with it if needed.     PATIENT EDUCATION:  Education details: see above Person educated: Patient Education method: Explanation Education comprehension: verbalized understanding  HOME EXERCISE PROGRAM: Access Code: [email or web address] URL: [email or web address] Date: 09/19/2024 Prepared by: Raj Blanch  Exercises - Sidelying Hip  Abduction  - 1 x daily - 7 x weekly - 2 sets - 10 reps - Clamshell  - 1 x daily - 7 x weekly - 2 sets - 10 reps - Supine Bridge  - 1 x daily - 7 x weekly - 2 sets - 10 reps - Seated Figure 4 Piriformis Stretch  - 1 x daily - 7 x weekly - 3 reps - 30 sec hold - Supine Figure 4 Piriformis Stretch  - 1 x daily - 7 x weekly - 3 reps - 30 sec hold  ASSESSMENT:  CLINICAL IMPRESSION: Today's session emphasis placed improving soft tissue flexibility and educating patient on getting back to consistent HEP to help manage symptoms better. Pt was edcated to expect some level of stiffness and pain considering her surgery  OBJECTIVE IMPAIRMENTS: Abnormal gait, decreased activity tolerance, decreased endurance, decreased mobility, difficulty walking, decreased ROM, decreased strength, hypomobility, increased edema, increased fascial restrictions, increased muscle spasms, impaired flexibility, improper body mechanics, and pain.   ACTIVITY LIMITATIONS: carrying, lifting, bending, standing, squatting, and bed mobility  PARTICIPATION LIMITATIONS: meal prep, cleaning, laundry, driving, shopping, and community activity  PERSONAL FACTORS: Age, Time since onset of injury/illness/exacerbation, and 1-2 comorbidities: Hx of lumbar fusion, hx cervical fusion, hx L TKA are also affecting  patient's functional outcome.   REHAB POTENTIAL: Good  CLINICAL DECISION MAKING: Stable/uncomplicated  EVALUATION COMPLEXITY: Moderate   GOALS: Goals reviewed with patient? Yes  SHORT TERM GOALS: Target date: 10/17/2024   Patient will demo at least 50% compliance with HEP Baseline: Goal met 10/04/24 Goal status: INITIAL   LONG TERM GOALS: Target date: 11/14/2024    Patient will report <2/10 pain at worst to improve pain with functional activities at home. Baseline: 4-5/10 (Eval); 2/10 (10/04/24) Goal status: progressing  2.  Patient will demo good body mechanics that are painfree when picking things up from the  floor. Baseline:  Goal status: INITIAL  3.  Pt will be able to pick up 20 lb kettle ball from floor with good body mechanics without pain. Baseline:  Goal status: INITIAL  4.  Pt will be able to perform floor to wait transfer with 20lbs with good body mechanis to improve ability to pick up grocery bags. Baseline:  Goal status: INITIAL  5.  Patient will demo 10% improvement on ODI to improve overall function Baseline: 12/45 = 27%- moderate disability Goal status: INITIAL   PLAN:  PT FREQUENCY: 2x/week  PT DURATION: 8 weeks  PLANNED INTERVENTIONS: [id number]- PT Re-evaluation, 97750- Physical Performance Testing, 97110-Therapeutic exercises, 97530- Therapeutic activity, 97112- Neuromuscular re-education, 97535- Self Care, [id number]- Manual therapy, [phone]- Gait training, [phone]- Aquatic Therapy, [phone]- Electrical stimulation (manual), Patient/Family education, Joint mobilization, Spinal mobilization, Scar mobilization, DME instructions, Cryotherapy, and Moist heat.  PLAN FOR NEXT SESSION: Review and progress HEP   Raj LOISE Blanch, PT 10/20/2024, 1:16 PM  "

## 2024-10-25 ENCOUNTER — Other Ambulatory Visit (INDEPENDENT_AMBULATORY_CARE_PROVIDER_SITE_OTHER)

## 2024-10-25 DIAGNOSIS — M2550 Pain in unspecified joint: Secondary | ICD-10-CM

## 2024-10-25 DIAGNOSIS — E785 Hyperlipidemia, unspecified: Secondary | ICD-10-CM

## 2024-10-25 DIAGNOSIS — I10 Essential (primary) hypertension: Secondary | ICD-10-CM | POA: Diagnosis not present

## 2024-10-25 DIAGNOSIS — Z79899 Other long term (current) drug therapy: Secondary | ICD-10-CM

## 2024-10-25 DIAGNOSIS — Z8261 Family history of arthritis: Secondary | ICD-10-CM

## 2024-10-25 DIAGNOSIS — E1165 Type 2 diabetes mellitus with hyperglycemia: Secondary | ICD-10-CM | POA: Diagnosis not present

## 2024-10-25 LAB — BASIC METABOLIC PANEL WITH GFR
BUN: 17 mg/dL (ref 6–23)
CO2: 28 meq/L (ref 19–32)
Calcium: 9.6 mg/dL (ref 8.4–10.5)
Chloride: 101 meq/L (ref 96–112)
Creatinine, Ser: 0.8 mg/dL (ref 0.40–1.20)
GFR: 72.98 mL/min
Glucose, Bld: 143 mg/dL — ABNORMAL HIGH (ref 70–99)
Potassium: 4.3 meq/L (ref 3.5–5.1)
Sodium: 137 meq/L (ref 135–145)

## 2024-10-25 LAB — CBC WITH DIFFERENTIAL/PLATELET
Basophils Absolute: 0.1 K/uL (ref 0.0–0.1)
Basophils Relative: 1.2 % (ref 0.0–3.0)
Eosinophils Absolute: 0.4 K/uL (ref 0.0–0.7)
Eosinophils Relative: 6.1 % — ABNORMAL HIGH (ref 0.0–5.0)
HCT: 33.7 % — ABNORMAL LOW (ref 36.0–46.0)
Hemoglobin: 11.2 g/dL — ABNORMAL LOW (ref 12.0–15.0)
Lymphocytes Relative: 54.9 % — ABNORMAL HIGH (ref 12.0–46.0)
Lymphs Abs: 3.3 K/uL (ref 0.7–4.0)
MCHC: 33.4 g/dL (ref 30.0–36.0)
MCV: 81.6 fl (ref 78.0–100.0)
Monocytes Absolute: 0.6 K/uL (ref 0.1–1.0)
Monocytes Relative: 9.3 % (ref 3.0–12.0)
Neutro Abs: 1.7 K/uL (ref 1.4–7.7)
Neutrophils Relative %: 28.5 % — ABNORMAL LOW (ref 43.0–77.0)
Platelets: 316 K/uL (ref 150.0–400.0)
RBC: 4.13 Mil/uL (ref 3.87–5.11)
RDW: 16 % — ABNORMAL HIGH (ref 11.5–15.5)
WBC: 6.1 K/uL (ref 4.0–10.5)

## 2024-10-25 LAB — LIPID PANEL
Cholesterol: 163 mg/dL (ref 28–200)
HDL: 60.6 mg/dL
LDL Cholesterol: 86 mg/dL (ref 10–99)
NonHDL: 101.91
Total CHOL/HDL Ratio: 3
Triglycerides: 79 mg/dL (ref 10.0–149.0)
VLDL: 15.8 mg/dL (ref 0.0–40.0)

## 2024-10-25 LAB — VITAMIN B12: Vitamin B-12: 1451 pg/mL — ABNORMAL HIGH (ref 211–911)

## 2024-10-25 LAB — MICROALBUMIN / CREATININE URINE RATIO
Creatinine,U: 34 mg/dL
Microalb Creat Ratio: UNDETERMINED mg/g (ref 0.0–30.0)
Microalb, Ur: <0.7 mg/dL

## 2024-10-25 LAB — HEPATIC FUNCTION PANEL
ALT: 11 U/L (ref 3–35)
AST: 13 U/L (ref 5–37)
Albumin: 4.2 g/dL (ref 3.5–5.2)
Alkaline Phosphatase: 81 U/L (ref 39–117)
Bilirubin, Direct: 0.1 mg/dL (ref 0.1–0.3)
Total Bilirubin: 0.5 mg/dL (ref 0.2–1.2)
Total Protein: 6.9 g/dL (ref 6.0–8.3)

## 2024-10-25 LAB — HEMOGLOBIN A1C: Hgb A1c MFr Bld: 7.4 % — ABNORMAL HIGH (ref 4.6–6.5)

## 2024-10-25 LAB — TSH: TSH: 2.53 u[IU]/mL (ref 0.35–5.50)

## 2024-10-25 LAB — C-REACTIVE PROTEIN: CRP: <0.5 mg/dL — ABNORMAL LOW (ref 1.0–20.0)

## 2024-10-26 LAB — ANA: Anti Nuclear Antibody (ANA): NEGATIVE

## 2024-10-26 LAB — RHEUMATOID FACTOR: Rheumatoid fact SerPl-aCnc: <10 [IU]/mL

## 2024-10-26 LAB — CYCLIC CITRUL PEPTIDE ANTIBODY, IGG: Cyclic Citrullin Peptide Ab: <16 U

## 2024-10-27 ENCOUNTER — Ambulatory Visit: Payer: Self-pay

## 2024-10-27 DIAGNOSIS — Z981 Arthrodesis status: Secondary | ICD-10-CM

## 2024-10-27 DIAGNOSIS — M5459 Other low back pain: Secondary | ICD-10-CM | POA: Diagnosis not present

## 2024-10-27 NOTE — Therapy (Signed)
 " OUTPATIENT PHYSICAL THERAPY THORACOLUMBAR TREATMENT NOTE   Patient Name: Holly Beasley MRN: [id number] DOB:1951/02/09, 74 y.o., female Today's Date: 10/27/2024  END OF SESSION:  PT End of Session - 10/27/24 1310     Visit Number 5    Number of Visits 13    Date for Recertification  10/31/24    PT Start Time 1310    PT Stop Time 1400    PT Time Calculation (min) 50 min    Equipment Utilized During Treatment Gait belt    Activity Tolerance Patient tolerated treatment well    Behavior During Therapy WFL for tasks assessed/performed          Past Medical History:  Diagnosis Date   Allergy    seasonal,take shots   Cataract    bilateral,removed 2019   Depression    Diabetes mellitus without complication (HCC)    Dysrhythmia    SVTs   GERD (gastroesophageal reflux disease)    History of ETOH abuse    recovering  in remission   Hyperlipidemia    Hypertension    Osteoarthritis    Osteopenia    dexa -1.7 h -1.35    Scoliosis    mild scoliosis, leg length discrepancy   Skin cancer    basal cell nose   Sleep apnea    did not meet criteria for cpap-has mouthpiece not using per pt   Substance abuse (HCC)    etoh 40 years ago   SVT (supraventricular tachycardia) 08/15/2016   Varicose veins    Wears contact lenses    Past Surgical History:  Procedure Laterality Date   ANTERIOR FUSION CERVICAL SPINE     [phone]   BACK SURGERY  05/2024   fusion in L3,4,5   BREAST BIOPSY     2003-rt   CATARACT EXTRACTION, BILATERAL  2019   COLONOSCOPY W/ POLYPECTOMY  2004,2010, 11-2022   precancerous   2004   DENTAL SURGERY  2015   bone graft    KNEE ARTHROSCOPY WITH LATERAL MENISECTOMY Left 11/10/2014   Procedure: LEFT KNEE ARTHROSCOPY PARTIAL LATERAL MENISECTOMY CHONDROPLASTY;  Surgeon: Maude KANDICE Herald, MD;  Location: [facility];  Service: Orthopedics;  Laterality: Left;   NASAL SINUS SURGERY  11/13/2008   polyps removed   skin cancer removal     basal cell  nose 2004   TONSILLECTOMY AND ADENOIDECTOMY  1960   TOTAL KNEE ARTHROPLASTY Left 02/03/2023   Procedure: LEFT TOTAL KNEE ARTHROPLASTY;  Surgeon: Herald Maude, MD;  Location: [facility];  Service: Orthopedics;  Laterality: Left;   VENOUS ABLATION Left 2015   Dr Gerlean   Patient Active Problem List   Diagnosis Date Noted   Primary osteoarthritis of left knee 02/03/2023   HLD (hyperlipidemia) 04/14/2016   Essential hypertension 12/18/2014   Medication management 12/18/2014   Varicose veins of leg with complications 10/09/2014   Left knee pain 09/21/2014   Tear of meniscus of knee 09/21/2014   Hx of bacterial sinusitis 08/28/2014   Knee pain, acute 06/26/2014   Leg edema, left 06/20/2014   Varicose veins of bilateral lower extremities with other complications 06/20/2014   Varicose veins 05/03/2014   Inguinal adenopathy by ultrasound doppler 05/03/2014   Left leg swelling 04/25/2014   Diabetes mellitus type 2, controlled, without complications (HCC) 04/25/2014   Visit for preventive health examination 06/07/2012   Foot dermatitis 06/07/2012   Shortness of breath 11/06/2011   Nausea 11/06/2011   Hand eczema 06/27/2011   Prediabetes 03/20/2011  Preventative health care 03/20/2011   Dry eyes    Headache 12/30/2010   Scoliosis    Osteopenia    History of ETOH abuse    Sinusitis, chronic 02/27/2010   LIBIDO, DECREASED 02/27/2010   GERD 02/20/2009   CONSTIPATION, CHRONIC 02/20/2009   SKIN CANCER, HX OF 02/20/2009   OSA (obstructive sleep apnea) 03/21/2008   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 Apolinar Eastern  REFERRING PROVIDER: Karenann Barnacle, PA-C  REFERRING DIAG: M54.2 (ICD-10-CM) - Cervicalgia M54.6 (ICD-10-CM) - Pain in thoracic spine Z98.1 (ICD-10-CM) - Arthrodesis status  Rationale for Evaluation and Treatment: Rehabilitation  THERAPY DIAG:  Other low back pain  S/P lumbar fusion  ONSET DATE: 09/07/2024  SUBJECTIVE:                                                                                                                                                                                           SUBJECTIVE STATEMENT: Pt reports she was inconsistent with exercises due to holidays. Stffness is worst in the morning, gets better and gets worst at end of the day. Pt is trying to cut down meidication as she can. Pain is 5/10 in the morning, middle of the day is 1-2/10, in the eventing, it is 5/10.   EVALUATION: Pt had L3-5 lumbar fusion. Pt reports after surgery her symptoms in L LE have significantly improved. Patient had no BLT restrictions for 3 months but now she doesn't have any restrictions. Pt reports pain in the morning and in the evening that goes up to 4/10. Pt reports that she may have some increased urinal and bowel incontinence after surgery compared to before (she has not mentioned this to her MD).   PERTINENT HISTORY:  L TKA 02/03/23. PMH: SVT, ACDF (C3-4) ,DM    PAIN:  Are you having pain? Yes: NPRS scale: 4-5/10 Pain location: back pain Pain description: throbbing, tightness Aggravating factors: stiffness, prolonged movement Relieving factors: pain meds,  PRECAUTIONS: None  RED FLAGS: None   WEIGHT BEARING RESTRICTIONS: No  FALLS:  Has patient fallen in last 6 months? No   PLOF: Independent  PATIENT GOALS:  Improve pain, improve ability to bend to pick things off the floor, improve ability to  tie shoes.   OBJECTIVE:  Note: Objective measures were completed at Evaluation unless otherwise noted.  PATIENT SURVEYS:  Oswestry Low Back Pain Disability Questionnaire: 12/45 = 27%- moderate disability  COGNITION: Overall cognitive status: Within functional limits for tasks assessed     SENSATION: WFL PALPATION: Edema present in lumbar spine  LUMBAR ROM:   AROM eval  Flexion 90% able to reach ankles bil  Extension 10% significantly limited  Right lateral flexion   Left lateral flexion   Right rotation   Left rotation    (Blank rows = not tested)  LOWER EXTREMITY ROM:     Active  Right eval Left eval  Hip flexion    Hip extension    Hip abduction    Hip adduction    Hip internal rotation    Hip external rotation    Knee flexion    Knee extension    Ankle dorsiflexion    Ankle plantarflexion    Ankle inversion    Ankle eversion     (Blank rows = not tested)  LOWER EXTREMITY MMT:    MMT Right eval Left eval  Hip flexion    Hip extension    Hip abduction    Hip adduction    Hip internal rotation    Hip external rotation    Knee flexion    Knee extension    Ankle dorsiflexion    Ankle plantarflexion    Ankle inversion    Ankle eversion     (Blank rows = not tested)  L TREATMENT DATE: STM to bil thoracilumbar praspinalis to relieve tension and mm spasms. Patient has increased tightness in lumbar paraspinalis and multifidus Pt educated on hydrating better Pt educated that she may experience soreness for next 2-3 days after deep tissue work today and educated on heat/ice and OTC pain meds to help her with it if needed.     PATIENT EDUCATION:  Education details: see above Person educated: Patient Education method: Explanation Education comprehension: verbalized understanding  HOME EXERCISE PROGRAM: Access Code: [email or web address] URL: [email or web address] Date: 09/19/2024 Prepared by: Raj Blanch  Exercises - Sidelying Hip  Abduction  - 1 x daily - 7 x weekly - 2 sets - 10 reps - Clamshell  - 1 x daily - 7 x weekly - 2 sets - 10 reps - Supine Bridge  - 1 x daily - 7 x weekly - 2 sets - 10 reps - Seated Figure 4 Piriformis Stretch  - 1 x daily - 7 x weekly - 3 reps - 30 sec hold - Supine Figure 4 Piriformis Stretch  - 1 x daily - 7 x weekly - 3 reps - 30 sec hold  ASSESSMENT:  CLINICAL IMPRESSION: Today's session emphasis placed improving soft tissue flexibility and educating patient on getting back to consistent HEP to help manage symptoms better. Pt was edcated to expect some level of stiffness and pain considering her surgery  OBJECTIVE IMPAIRMENTS: Abnormal gait, decreased activity tolerance, decreased endurance, decreased mobility, difficulty walking, decreased ROM, decreased strength, hypomobility, increased edema, increased fascial restrictions, increased muscle spasms, impaired flexibility, improper body mechanics, and pain.   ACTIVITY LIMITATIONS: carrying, lifting, bending, standing, squatting, and bed mobility  PARTICIPATION LIMITATIONS: meal prep, cleaning, laundry, driving, shopping, and community activity  PERSONAL FACTORS: Age, Time since onset of injury/illness/exacerbation, and 1-2 comorbidities: Hx of lumbar fusion, hx cervical fusion, hx L TKA are also affecting  patient's functional outcome.   REHAB POTENTIAL: Good  CLINICAL DECISION MAKING: Stable/uncomplicated  EVALUATION COMPLEXITY: Moderate   GOALS: Goals reviewed with patient? Yes  SHORT TERM GOALS: Target date: 10/17/2024   Patient will demo at least 50% compliance with HEP Baseline: Goal met 10/04/24 Goal status: INITIAL   LONG TERM GOALS: Target date: 11/14/2024    Patient will report <2/10 pain at worst to improve pain with functional activities at home. Baseline: 4-5/10 (Eval); 2/10 (10/04/24) Goal status: progressing  2.  Patient will demo good body mechanics that are painfree when picking things up from the  floor. Baseline:  Goal status: INITIAL  3.  Pt will be able to pick up 20 lb kettle ball from floor with good body mechanics without pain. Baseline:  Goal status: INITIAL  4.  Pt will be able to perform floor to wait transfer with 20lbs with good body mechanis to improve ability to pick up grocery bags. Baseline:  Goal status: INITIAL  5.  Patient will demo 10% improvement on ODI to improve overall function Baseline: 12/45 = 27%- moderate disability Goal status: INITIAL   PLAN:  PT FREQUENCY: 2x/week  PT DURATION: 8 weeks  PLANNED INTERVENTIONS: 97164- PT Re-evaluation, 97750- Physical Performance Testing, 97110-Therapeutic exercises, 97530- Therapeutic activity, 97112- Neuromuscular re-education, 97535- Self Care, 02859- Manual therapy, [phone]- Gait training, [phone]- Aquatic Therapy, [phone]- Electrical stimulation (manual), Patient/Family education, Joint mobilization, Spinal mobilization, Scar mobilization, DME instructions, Cryotherapy, and Moist heat.  PLAN FOR NEXT SESSION: Review and progress HEP   Raj LOISE Blanch, PT 10/27/2024, 1:12 PM  "

## 2024-11-01 ENCOUNTER — Ambulatory Visit: Payer: Self-pay

## 2024-11-02 ENCOUNTER — Ambulatory Visit: Payer: Self-pay | Admitting: Internal Medicine

## 2024-11-02 DIAGNOSIS — D649 Anemia, unspecified: Secondary | ICD-10-CM

## 2024-11-02 NOTE — Progress Notes (Signed)
 Mild anemia again   vit b12 high  so dont seen supplementation  inflammation markers for arthritis are negative Cholesterol stable  I suggest increase iron intake  pills or diet   Then  plan cbc diff and iron  ferritin ibc panel in  2 -3 months  or so  before next visit to be sure  stable and address

## 2024-11-03 ENCOUNTER — Ambulatory Visit: Payer: Self-pay

## 2024-11-03 DIAGNOSIS — M5459 Other low back pain: Secondary | ICD-10-CM | POA: Diagnosis not present

## 2024-11-03 DIAGNOSIS — Z981 Arthrodesis status: Secondary | ICD-10-CM

## 2024-11-03 NOTE — Therapy (Signed)
 " OUTPATIENT PHYSICAL THERAPY THORACOLUMBAR TREATMENT NOTE   Patient Name: Holly Beasley MRN: [id number] DOB:Dec 15, 1950, 74 y.o., female Today's Date: 11/03/2024  END OF SESSION:  PT End of Session - 11/03/24 1409     Visit Number 6    Number of Visits 13    Date for Recertification  10/31/24    PT Start Time 1325    PT Stop Time 1410    PT Time Calculation (min) 45 min    Equipment Utilized During Treatment Gait belt    Activity Tolerance Patient tolerated treatment well    Behavior During Therapy WFL for tasks assessed/performed           Past Medical History:  Diagnosis Date   Allergy    seasonal,take shots   Cataract    bilateral,removed 2019   Depression    Diabetes mellitus without complication (HCC)    Dysrhythmia    SVTs   GERD (gastroesophageal reflux disease)    History of ETOH abuse    recovering  in remission   Hyperlipidemia    Hypertension    Osteoarthritis    Osteopenia    dexa -1.7 h -1.35    Scoliosis    mild scoliosis, leg length discrepancy   Skin cancer    basal cell nose   Sleep apnea    did not meet criteria for cpap-has mouthpiece not using per pt   Substance abuse (HCC)    etoh 40 years ago   SVT (supraventricular tachycardia) 08/15/2016   Varicose veins    Wears contact lenses    Past Surgical History:  Procedure Laterality Date   ANTERIOR FUSION CERVICAL SPINE     [phone]   BACK SURGERY  05/2024   fusion in L3,4,5   BREAST BIOPSY     2003-rt   CATARACT EXTRACTION, BILATERAL  2019   COLONOSCOPY W/ POLYPECTOMY  2004,2010, 11-2022   precancerous   2004   DENTAL SURGERY  2015   bone graft    KNEE ARTHROSCOPY WITH LATERAL MENISECTOMY Left 11/10/2014   Procedure: LEFT KNEE ARTHROSCOPY PARTIAL LATERAL MENISECTOMY CHONDROPLASTY;  Surgeon: Maude KANDICE Herald, MD;  Location: [facility];  Service: Orthopedics;  Laterality: Left;   NASAL SINUS SURGERY  11/13/2008   polyps removed   skin cancer removal     basal  cell nose 2004   TONSILLECTOMY AND ADENOIDECTOMY  1960   TOTAL KNEE ARTHROPLASTY Left 02/03/2023   Procedure: LEFT TOTAL KNEE ARTHROPLASTY;  Surgeon: Herald Maude, MD;  Location: [facility];  Service: Orthopedics;  Laterality: Left;   VENOUS ABLATION Left 2015   Dr Gerlean   Patient Active Problem List   Diagnosis Date Noted   Primary osteoarthritis of left knee 02/03/2023   HLD (hyperlipidemia) 04/14/2016   Essential hypertension 12/18/2014   Medication management 12/18/2014   Varicose veins of leg with complications 10/09/2014   Left knee pain 09/21/2014   Tear of meniscus of knee 09/21/2014   Hx of bacterial sinusitis 08/28/2014   Knee pain, acute 06/26/2014   Leg edema, left 06/20/2014   Varicose veins of bilateral lower extremities with other complications 06/20/2014   Varicose veins 05/03/2014   Inguinal adenopathy by ultrasound doppler 05/03/2014   Left leg swelling 04/25/2014   Diabetes mellitus type 2, controlled, without complications (HCC) 04/25/2014   Visit for preventive health examination 06/07/2012   Foot dermatitis 06/07/2012   Shortness of breath 11/06/2011   Nausea 11/06/2011   Hand eczema 06/27/2011   Prediabetes 03/20/2011  Preventative health care 03/20/2011   Dry eyes    Headache 12/30/2010   Scoliosis    Osteopenia    History of ETOH abuse    Sinusitis, chronic 02/27/2010   LIBIDO, DECREASED 02/27/2010   GERD 02/20/2009   CONSTIPATION, CHRONIC 02/20/2009   SKIN CANCER, HX OF 02/20/2009   OSA (obstructive sleep apnea) 03/21/2008   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 Apolinar Eastern  REFERRING PROVIDER: Karenann Barnacle, PA-C  REFERRING DIAG: M54.2 (ICD-10-CM) - Cervicalgia M54.6 (ICD-10-CM) - Pain in thoracic spine Z98.1 (ICD-10-CM) - Arthrodesis status  Rationale for Evaluation and Treatment: Rehabilitation  THERAPY DIAG:  Other low back pain  S/P lumbar fusion  ONSET DATE: 09/07/2024  SUBJECTIVE:                                                                                                                                                                                           SUBJECTIVE STATEMENT: Ptreports she had pain free day after last time and overall feeling less pain. Symptoms are intermittent.   EVALUATION: Pt had L3-5 lumbar fusion. Pt reports after surgery her symptoms in L LE have significantly improved. Patient had no BLT restrictions for 3 months but now she doesn't have any restrictions. Pt reports pain in the morning and in the evening that goes up to 4/10. Pt reports that she may have some increased urinal and bowel incontinence after surgery compared to before (she has not mentioned this to her MD).   PERTINENT HISTORY:  L TKA 02/03/23. PMH: SVT, ACDF (C3-4) ,DM    PAIN:  Are you having pain? Yes: NPRS scale: 4-5/10 Pain location: back pain Pain description: throbbing, tightness Aggravating factors: stiffness, prolonged movement Relieving factors: pain meds,  PRECAUTIONS: None  RED FLAGS: None   WEIGHT BEARING RESTRICTIONS: No  FALLS:  Has patient fallen in last 6 months? No   PLOF: Independent  PATIENT GOALS: Improve pain, improve ability to bend to pick things off the floor, improve ability to tie shoes.   OBJECTIVE:  Note: Objective measures were completed at Evaluation unless otherwise noted.  PATIENT SURVEYS:  Oswestry Low  Back Pain Disability  Questionnaire: 12/45 = 27%- moderate disability  COGNITION: Overall cognitive status: Within functional limits for tasks assessed     SENSATION: WFL PALPATION: Edema present in lumbar spine  LUMBAR ROM:   AROM eval  Flexion 90% able to reach ankles bil  Extension 10% significantly limited  Right lateral flexion   Left lateral flexion   Right rotation   Left rotation    (Blank rows = not tested)  LOWER EXTREMITY ROM:     Active  Right eval Left eval  Hip flexion    Hip extension    Hip abduction    Hip adduction    Hip internal rotation    Hip external rotation    Knee flexion    Knee extension    Ankle dorsiflexion    Ankle plantarflexion    Ankle inversion    Ankle eversion     (Blank rows = not tested)  LOWER EXTREMITY MMT:    MMT Right eval Left eval  Hip flexion    Hip extension    Hip abduction    Hip adduction    Hip internal rotation    Hip external rotation    Knee flexion    Knee extension    Ankle dorsiflexion    Ankle plantarflexion    Ankle inversion    Ankle eversion     (Blank rows = not tested)  L TREATMENT DATE: STM to bil thoracilumbar praspinalis to relieve tension and mm spasms. Patient has increased tightness in lumbar paraspinalis and multifidus   SL clamshells: 2 x 10 L only SL reverse clamshells: 2 x 10 L only SL pelvi elevation and depression: 2 x 10 L only  PATIENT EDUCATION:  Education details: see above Person educated: Patient Education method: Explanation Education comprehension: verbalized understanding  HOME EXERCISE PROGRAM: Access Code: [email or web address] URL: [email or web address] Date: 09/19/2024 Prepared by: Raj Blanch  Exercises - Sidelying Hip Abduction  - 1 x daily - 7 x weekly - 2 sets - 10 reps - Clamshell  - 1 x daily - 7 x weekly - 2 sets - 10 reps - Supine Bridge  - 1 x daily - 7 x weekly - 2 sets - 10 reps - Seated Figure 4 Piriformis Stretch  - 1 x daily - 7 x weekly - 3 reps - 30  sec hold - Supine Figure 4 Piriformis Stretch  - 1 x daily - 7 x weekly - 3 reps - 30 sec hold  ASSESSMENT:  CLINICAL IMPRESSION: Today's session emphasis placed improving soft tissue flexibility. Pt is reporting improvement in her symptoms with manual therapy.  OBJECTIVE IMPAIRMENTS: Abnormal gait, decreased activity tolerance, decreased endurance, decreased mobility, difficulty walking, decreased ROM, decreased strength, hypomobility, increased edema, increased fascial restrictions, increased muscle spasms, impaired flexibility, improper body mechanics, and pain.   ACTIVITY LIMITATIONS: carrying, lifting, bending, standing, squatting, and bed mobility  PARTICIPATION LIMITATIONS: meal prep, cleaning, laundry, driving, shopping, and community activity  PERSONAL FACTORS: Age, Time since onset of injury/illness/exacerbation, and 1-2 comorbidities: Hx of lumbar fusion, hx cervical fusion, hx L TKA are also affecting patient's functional outcome.   REHAB POTENTIAL: Good  CLINICAL DECISION MAKING: Stable/uncomplicated  EVALUATION COMPLEXITY: Moderate   GOALS: Goals reviewed with patient? Yes  SHORT TERM GOALS: Target date: 10/17/2024   Patient will demo at least 50% compliance with HEP Baseline: Goal met 10/04/24 Goal status: INITIAL   LONG TERM GOALS: Target date: 11/14/2024    Patient will report <2/10 pain at  worst to improve pain with functional activities at home. Baseline: 4-5/10 (Eval); 2/10 (10/04/24) Goal status: progressing  2.  Patient will demo good body mechanics that are painfree when picking things up from the floor. Baseline:  Goal status: INITIAL  3.  Pt will be able to pick up 20 lb kettle ball from floor with good body mechanics without pain. Baseline:  Goal status: INITIAL  4.  Pt will be able to perform floor to wait transfer with 20lbs with good body mechanis to improve ability to pick up grocery bags. Baseline:  Goal status: INITIAL  5.  Patient  will demo 10% improvement on ODI to improve overall function Baseline: 12/45 = 27%- moderate disability Goal status: INITIAL   PLAN:  PT FREQUENCY: 2x/week  PT DURATION: 8 weeks  PLANNED INTERVENTIONS: 97164- PT Re-evaluation, 97750- Physical Performance Testing, 97110-Therapeutic exercises, 97530- Therapeutic activity, 97112- Neuromuscular re-education, 97535- Self Care, 02859- Manual therapy, [phone]- Gait training, [phone]- Aquatic Therapy, [phone]- Electrical stimulation (manual), Patient/Family education, Joint mobilization, Spinal mobilization, Scar mobilization, DME instructions, Cryotherapy, and Moist heat.  PLAN FOR NEXT SESSION: Review and progress HEP   Raj LOISE Blanch, PT 11/03/2024, 2:19 PM  "

## 2024-11-10 ENCOUNTER — Ambulatory Visit: Payer: Self-pay

## 2024-11-10 DIAGNOSIS — M5459 Other low back pain: Secondary | ICD-10-CM | POA: Diagnosis not present

## 2024-11-10 DIAGNOSIS — Z981 Arthrodesis status: Secondary | ICD-10-CM

## 2024-11-10 NOTE — Therapy (Signed)
 " OUTPATIENT PHYSICAL THERAPY THORACOLUMBAR TREATMENT NOTE   Patient Name: Holly Beasley MRN: [id number] DOB:21-Dec-1950, 74 y.o., female Today's Date: 11/10/2024  END OF SESSION:  PT End of Session - 11/10/24 1429     Visit Number 7    Number of Visits 13    Date for Recertification  10/31/24    PT Start Time 1325    PT Stop Time 1410    PT Time Calculation (min) 45 min    Equipment Utilized During Treatment Gait belt    Activity Tolerance Patient tolerated treatment well    Behavior During Therapy WFL for tasks assessed/performed           Past Medical History:  Diagnosis Date   Allergy    seasonal,take shots   Cataract    bilateral,removed 2019   Depression    Diabetes mellitus without complication (HCC)    Dysrhythmia    SVTs   GERD (gastroesophageal reflux disease)    History of ETOH abuse    recovering  in remission   Hyperlipidemia    Hypertension    Osteoarthritis    Osteopenia    dexa -1.7 h -1.35    Scoliosis    mild scoliosis, leg length discrepancy   Skin cancer    basal cell nose   Sleep apnea    did not meet criteria for cpap-has mouthpiece not using per pt   Substance abuse (HCC)    etoh 40 years ago   SVT (supraventricular tachycardia) 08/15/2016   Varicose veins    Wears contact lenses    Past Surgical History:  Procedure Laterality Date   ANTERIOR FUSION CERVICAL SPINE     [phone]   BACK SURGERY  05/2024   fusion in L3,4,5   BREAST BIOPSY     2003-rt   CATARACT EXTRACTION, BILATERAL  2019   COLONOSCOPY W/ POLYPECTOMY  2004,2010, 11-2022   precancerous   2004   DENTAL SURGERY  2015   bone graft    KNEE ARTHROSCOPY WITH LATERAL MENISECTOMY Left 11/10/2014   Procedure: LEFT KNEE ARTHROSCOPY PARTIAL LATERAL MENISECTOMY CHONDROPLASTY;  Surgeon: Maude KANDICE Herald, MD;  Location: [facility];  Service: Orthopedics;  Laterality: Left;   NASAL SINUS SURGERY  11/13/2008   polyps removed   skin cancer removal     basal  cell nose 2004   TONSILLECTOMY AND ADENOIDECTOMY  1960   TOTAL KNEE ARTHROPLASTY Left 02/03/2023   Procedure: LEFT TOTAL KNEE ARTHROPLASTY;  Surgeon: Herald Maude, MD;  Location: [facility];  Service: Orthopedics;  Laterality: Left;   VENOUS ABLATION Left 2015   Dr Gerlean   Patient Active Problem List   Diagnosis Date Noted   Primary osteoarthritis of left knee 02/03/2023   HLD (hyperlipidemia) 04/14/2016   Essential hypertension 12/18/2014   Medication management 12/18/2014   Varicose veins of leg with complications 10/09/2014   Left knee pain 09/21/2014   Tear of meniscus of knee 09/21/2014   Hx of bacterial sinusitis 08/28/2014   Knee pain, acute 06/26/2014   Leg edema, left 06/20/2014   Varicose veins of bilateral lower extremities with other complications 06/20/2014   Varicose veins 05/03/2014   Inguinal adenopathy by ultrasound doppler 05/03/2014   Left leg swelling 04/25/2014   Diabetes mellitus type 2, controlled, without complications (HCC) 04/25/2014   Visit for preventive health examination 06/07/2012   Foot dermatitis 06/07/2012   Shortness of breath 11/06/2011   Nausea 11/06/2011   Hand eczema 06/27/2011   Prediabetes 03/20/2011  Preventative health care 03/20/2011   Dry eyes    Headache 12/30/2010   Scoliosis    Osteopenia    History of ETOH abuse    Sinusitis, chronic 02/27/2010   LIBIDO, DECREASED 02/27/2010   GERD 02/20/2009   CONSTIPATION, CHRONIC 02/20/2009   SKIN CANCER, HX OF 02/20/2009   OSA (obstructive sleep apnea) 03/21/2008   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 Apolinar Eastern  REFERRING PROVIDER: Karenann Barnacle, PA-C  REFERRING DIAG: M54.2 (ICD-10-CM) - Cervicalgia M54.6 (ICD-10-CM) - Pain in thoracic spine Z98.1 (ICD-10-CM) - Arthrodesis status  Rationale for Evaluation and Treatment: Rehabilitation  THERAPY DIAG:  Other low back pain  S/P lumbar fusion  ONSET DATE: 09/07/2024  SUBJECTIVE:                                                                                                                                                                                           SUBJECTIVE STATEMENT: Ptreports she is feeling better. She is still feeling morning stiffness but pain is improved. Pt reports she couldn't figure out how to perform reverse clamshells.   EVALUATION: Pt had L3-5 lumbar fusion. Pt reports after surgery her symptoms in L LE have significantly improved. Patient had no BLT restrictions for 3 months but now she doesn't have any restrictions. Pt reports pain in the morning and in the evening that goes up to 4/10. Pt reports that she may have some increased urinal and bowel incontinence after surgery compared to before (she has not mentioned this to her MD).   PERTINENT HISTORY:  L TKA 02/03/23. PMH: SVT, ACDF (C3-4) ,DM    PAIN:  Are you having pain? Yes: NPRS scale: 4-5/10 Pain location: back pain Pain description: throbbing, tightness Aggravating factors: stiffness, prolonged movement Relieving factors: pain meds,  PRECAUTIONS: None  RED FLAGS: None   WEIGHT BEARING RESTRICTIONS: No  FALLS:  Has patient fallen in last 6 months? No   PLOF: Independent  PATIENT GOALS: Improve pain, improve ability to bend to pick things off the floor, improve ability to tie shoes.   OBJECTIVE:  Note: Objective measures were completed at Evaluation  unless otherwise noted.  PATIENT  SURVEYS:  Oswestry Low Back Pain Disability Questionnaire: 12/45 = 27%- moderate disability  COGNITION: Overall cognitive status: Within functional limits for tasks assessed     SENSATION: WFL PALPATION: Edema present in lumbar spine  LUMBAR ROM:   AROM eval  Flexion 90% able to reach ankles bil  Extension 10% significantly limited  Right lateral flexion   Left lateral flexion   Right rotation   Left rotation    (Blank rows = not tested)  LOWER EXTREMITY ROM:     Active  Right eval Left eval  Hip flexion    Hip extension    Hip abduction    Hip adduction    Hip internal rotation    Hip external rotation    Knee flexion    Knee extension    Ankle dorsiflexion    Ankle plantarflexion    Ankle inversion    Ankle eversion     (Blank rows = not tested)  LOWER EXTREMITY MMT:    MMT Right eval Left eval  Hip flexion    Hip extension    Hip abduction    Hip adduction    Hip internal rotation    Hip external rotation    Knee flexion    Knee extension    Ankle dorsiflexion    Ankle plantarflexion    Ankle inversion    Ankle eversion     (Blank rows = not tested)  L TREATMENT DATE: STM to bil thoracilumbar praspinalis to relieve tension and mm spasms. Patient has increased tightness in lumbar paraspinalis and multifidus Passive stretching of bil hip flexors, quads, piriformis stretch, hip extensors    SL clamshells: 2 x 10 L only SL reverse clamshells: 2 x 10 L only  PATIENT EDUCATION:  Education details: see above Person educated: Patient Education method: Explanation Education comprehension: verbalized understanding  HOME EXERCISE PROGRAM: Access Code: [email or web address] URL: [email or web address] Date: 09/19/2024 Prepared by: Raj Blanch  Exercises - Sidelying Hip Abduction  - 1 x daily - 7 x weekly - 2 sets - 10 reps - Clamshell  - 1 x daily - 7 x weekly - 2 sets - 10 reps - Supine Bridge  - 1 x daily - 7 x weekly - 2 sets - 10  reps - Seated Figure 4 Piriformis Stretch  - 1 x daily - 7 x weekly - 3 reps - 30 sec hold - Supine Figure 4 Piriformis Stretch  - 1 x daily - 7 x weekly - 3 reps - 30 sec hold  ASSESSMENT:  CLINICAL IMPRESSION: Today's session emphasis placed improving soft tissue flexibility. Pt is reporting improvement in her symptoms with manual therapy. Today, we focused more on improving hip mobility to improve overall tension in hip and lumbar spine.  OBJECTIVE IMPAIRMENTS: Abnormal gait, decreased activity tolerance, decreased endurance, decreased mobility, difficulty walking, decreased ROM, decreased strength, hypomobility, increased edema, increased fascial restrictions, increased muscle spasms, impaired flexibility, improper body mechanics, and pain.   ACTIVITY LIMITATIONS: carrying, lifting, bending, standing, squatting, and bed mobility  PARTICIPATION LIMITATIONS: meal prep, cleaning, laundry, driving, shopping, and community activity  PERSONAL FACTORS: Age, Time since onset of injury/illness/exacerbation, and 1-2 comorbidities: Hx of lumbar fusion, hx cervical fusion, hx L TKA are also affecting patient's functional outcome.   REHAB POTENTIAL: Good  CLINICAL DECISION MAKING: Stable/uncomplicated  EVALUATION COMPLEXITY: Moderate   GOALS: Goals reviewed with patient? Yes  SHORT TERM GOALS: Target date: 10/17/2024   Patient will demo at least  50% compliance with HEP Baseline: Goal met 10/04/24 Goal status: INITIAL   LONG TERM GOALS: Target date: 11/14/2024    Patient will report <2/10 pain at worst to improve pain with functional activities at home. Baseline: 4-5/10 (Eval); 2/10 (10/04/24) Goal status: progressing  2.  Patient will demo good body mechanics that are painfree when picking things up from the floor. Baseline:  Goal status: INITIAL  3.  Pt will be able to pick up 20 lb kettle ball from floor with good body mechanics without pain. Baseline:  Goal status:  INITIAL  4.  Pt will be able to perform floor to wait transfer with 20lbs with good body mechanis to improve ability to pick up grocery bags. Baseline:  Goal status: INITIAL  5.  Patient will demo 10% improvement on ODI to improve overall function Baseline: 12/45 = 27%- moderate disability Goal status: INITIAL   PLAN:  PT FREQUENCY: 2x/week  PT DURATION: 8 weeks  PLANNED INTERVENTIONS: 97164- PT Re-evaluation, 97750- Physical Performance Testing, 97110-Therapeutic exercises, 97530- Therapeutic activity, 97112- Neuromuscular re-education, 97535- Self Care, 02859- Manual therapy, [phone]- Gait training, [phone]- Aquatic Therapy, [phone]- Electrical stimulation (manual), Patient/Family education, Joint mobilization, Spinal mobilization, Scar mobilization, DME instructions, Cryotherapy, and Moist heat.  PLAN FOR NEXT SESSION: Review and progress HEP   Raj LOISE Blanch, PT 11/10/2024, 2:29 PM  "

## 2024-11-15 ENCOUNTER — Ambulatory Visit: Payer: Self-pay

## 2024-11-17 ENCOUNTER — Ambulatory Visit: Payer: Self-pay

## 2024-11-17 DIAGNOSIS — Z981 Arthrodesis status: Secondary | ICD-10-CM

## 2024-11-17 DIAGNOSIS — M5459 Other low back pain: Secondary | ICD-10-CM

## 2024-11-17 NOTE — Therapy (Signed)
 " OUTPATIENT PHYSICAL THERAPY THORACOLUMBAR TREATMENT NOTE   Patient Name: Holly Beasley MRN: [id number] DOB:01/07/1951, 74 y.o., female Today's Date: 11/17/2024  END OF SESSION:  PT End of Session - 11/17/24 1424     Visit Number 8    Number of Visits 12    Date for Recertification  12/29/24    PT Start Time 1315    PT Stop Time 1400    PT Time Calculation (min) 45 min    Equipment Utilized During Treatment Gait belt    Activity Tolerance Patient tolerated treatment well    Behavior During Therapy WFL for tasks assessed/performed           Past Medical History:  Diagnosis Date   Allergy    seasonal,take shots   Cataract    bilateral,removed 2019   Depression    Diabetes mellitus without complication (HCC)    Dysrhythmia    SVTs   GERD (gastroesophageal reflux disease)    History of ETOH abuse    recovering  in remission   Hyperlipidemia    Hypertension    Osteoarthritis    Osteopenia    dexa -1.7 h -1.35    Scoliosis    mild scoliosis, leg length discrepancy   Skin cancer    basal cell nose   Sleep apnea    did not meet criteria for cpap-has mouthpiece not using per pt   Substance abuse (HCC)    etoh 40 years ago   SVT (supraventricular tachycardia) 08/15/2016   Varicose veins    Wears contact lenses    Past Surgical History:  Procedure Laterality Date   ANTERIOR FUSION CERVICAL SPINE     [phone]   BACK SURGERY  05/2024   fusion in L3,4,5   BREAST BIOPSY     2003-rt   CATARACT EXTRACTION, BILATERAL  2019   COLONOSCOPY W/ POLYPECTOMY  2004,2010, 11-2022   precancerous   2004   DENTAL SURGERY  2015   bone graft    KNEE ARTHROSCOPY WITH LATERAL MENISECTOMY Left 11/10/2014   Procedure: LEFT KNEE ARTHROSCOPY PARTIAL LATERAL MENISECTOMY CHONDROPLASTY;  Surgeon: Maude KANDICE Herald, MD;  Location: [facility];  Service: Orthopedics;  Laterality: Left;   NASAL SINUS SURGERY  11/13/2008   polyps removed   skin cancer removal     basal  cell nose 2004   TONSILLECTOMY AND ADENOIDECTOMY  1960   TOTAL KNEE ARTHROPLASTY Left 02/03/2023   Procedure: LEFT TOTAL KNEE ARTHROPLASTY;  Surgeon: Herald Maude, MD;  Location: [facility];  Service: Orthopedics;  Laterality: Left;   VENOUS ABLATION Left 2015   Dr Gerlean   Patient Active Problem List   Diagnosis Date Noted   Primary osteoarthritis of left knee 02/03/2023   HLD (hyperlipidemia) 04/14/2016   Essential hypertension 12/18/2014   Medication management 12/18/2014   Varicose veins of leg with complications 10/09/2014   Left knee pain 09/21/2014   Tear of meniscus of knee 09/21/2014   Hx of bacterial sinusitis 08/28/2014   Knee pain, acute 06/26/2014   Leg edema, left 06/20/2014   Varicose veins of bilateral lower extremities with other complications 06/20/2014   Varicose veins 05/03/2014   Inguinal adenopathy by ultrasound doppler 05/03/2014   Left leg swelling 04/25/2014   Diabetes mellitus type 2, controlled, without complications (HCC) 04/25/2014   Visit for preventive health examination 06/07/2012   Foot dermatitis 06/07/2012   Shortness of breath 11/06/2011   Nausea 11/06/2011   Hand eczema 06/27/2011   Prediabetes 03/20/2011  Preventative health care 03/20/2011   Dry eyes    Headache 12/30/2010   Scoliosis    Osteopenia    History of ETOH abuse    Sinusitis, chronic 02/27/2010   LIBIDO, DECREASED 02/27/2010   GERD 02/20/2009   CONSTIPATION, CHRONIC 02/20/2009   SKIN CANCER, HX OF 02/20/2009   OSA (obstructive sleep apnea) 03/21/2008   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 Apolinar Eastern  REFERRING PROVIDER: Karenann Barnacle, PA-C  REFERRING DIAG: M54.2 (ICD-10-CM) - Cervicalgia M54.6 (ICD-10-CM) - Pain in thoracic spine Z98.1 (ICD-10-CM) - Arthrodesis status  Rationale for Evaluation and Treatment: Rehabilitation  THERAPY DIAG:  Other low back pain  S/P lumbar fusion  ONSET DATE: 09/07/2024  SUBJECTIVE:                                                                                                                                                                                           SUBJECTIVE STATEMENT: Ptreports she is feeling better. Last week has been stiff and she thinks it is due to cold weather. She was sore in her knees and hips after stretching last session. Symptoms are about the same.   EVALUATION: Pt had L3-5 lumbar fusion. Pt reports after surgery her symptoms in L LE have significantly improved. Patient had no BLT restrictions for 3 months but now she doesn't have any restrictions. Pt reports pain in the morning and in the evening that goes up to 4/10. Pt reports that she may have some increased urinal and bowel incontinence after surgery compared to before (she has not mentioned this to her MD).   PERTINENT HISTORY:  L TKA 02/03/23. PMH: SVT, ACDF (C3-4) ,DM    PAIN:  Are you having pain? Yes: NPRS scale: 4-5/10 Pain location: back pain Pain description: throbbing, tightness Aggravating factors: stiffness, prolonged movement Relieving factors: pain meds,  PRECAUTIONS: None  RED FLAGS: None   WEIGHT BEARING RESTRICTIONS: No  FALLS:  Has patient fallen in last 6 months? No   PLOF: Independent  PATIENT GOALS: Improve pain, improve ability to bend to pick things off the floor, improve ability to tie shoes.  OBJECTIVE:  Note: Objective measures were completed at Evaluation  unless otherwise noted.  PATIENT SURVEYS:  Oswestry Low Back Pain Disability Questionnaire: 12/45 = 27%- moderate disability  COGNITION: Overall cognitive status: Within functional limits for tasks assessed     SENSATION: WFL PALPATION: Edema present in lumbar spine  LUMBAR ROM:   AROM eval  Flexion 90% able to reach ankles bil  Extension 10% significantly limited  Right lateral flexion   Left lateral flexion   Right rotation   Left rotation    (Blank rows = not tested)  LOWER EXTREMITY ROM:     Active  Right eval Left eval  Hip flexion    Hip extension    Hip abduction    Hip adduction    Hip internal rotation    Hip external rotation    Knee flexion    Knee extension    Ankle dorsiflexion    Ankle plantarflexion    Ankle inversion    Ankle eversion     (Blank rows = not tested)  LOWER EXTREMITY MMT:    MMT Right eval Left eval  Hip flexion    Hip extension    Hip abduction    Hip adduction    Hip internal rotation    Hip external rotation    Knee flexion    Knee extension    Ankle dorsiflexion    Ankle plantarflexion    Ankle inversion    Ankle eversion     (Blank rows = not tested)  L TREATMENT DATE:  Discussed consulting with referring MD to see if ESI may be helpful to reduce her back pain at her next visit. Screened lumbar AROM and all planes of motions were painful with most pain with flexion in center of lumbar spine. Attempted Sidelying trunk twists with yellow band in UE 10x R and L- pt felt stiffer afterwards STM to bil thoracilumbar praspinalis to relieve tension and mm spasms. Patient has increased tightness in lumbar paraspinalis and multifidus Performed Instrument assisted soft tissue mobilization to lumbar and lower thoracic paraspinalis bil. Pt reported 0/10 pain with all planes of motions with standing flexion, lateral flexion and extension at end of the session. Performed sidelying upper trunk twists with yellow theraband in  hand: 10x R and L Supine bridge with yellow band for resistance across anterior pelvis: 2 x 10 SL clamshells and reverse clamshells: 2 x 10   PATIENT EDUCATION:  Education details: see above Person educated: Patient Education method: Explanation Education comprehension: verbalized understanding  HOME EXERCISE PROGRAM: Access Code: [email or web address] URL: [email or web address] Date: 09/19/2024 Prepared by: Raj Blanch  Exercises - Sidelying Hip Abduction  - 1 x daily - 7 x weekly - 2 sets - 10 reps - Clamshell  - 1 x daily - 7 x weekly - 2 sets - 10 reps - Supine Bridge  - 1 x daily - 7 x weekly - 2 sets - 10 reps - Seated Figure 4 Piriformis Stretch  - 1 x daily - 7 x weekly - 3 reps - 30 sec hold - Supine Figure 4 Piriformis Stretch  - 1 x daily - 7 x weekly - 3 reps - 30 sec hold  ASSESSMENT:  CLINICAL IMPRESSION: Patient has been seen for total of 8 sessions from 09/19/24 to 11/17/24 for lower back pain s/p lumbar surgery. Patient has reported decreased back pain but still reports of significant stiffness and pain in morning that can be as high as 5/10. During the middle part of  her day, she reports of 1/10 pain in her lower back. Pt reports pain increasing to 5/10 towards the end of the day. Patient is demo fair compliance with home exercise program. Patient will benefit from decreasing frequency of skilled PT to 1x/week to emphasize independent management with HEP and continue to progress HEP and utilize manual therapy for pain management.   OBJECTIVE IMPAIRMENTS: Abnormal gait, decreased activity tolerance, decreased endurance, decreased mobility, difficulty walking, decreased ROM, decreased strength, hypomobility, increased edema, increased fascial restrictions, increased muscle spasms, impaired flexibility, improper body mechanics, and pain.   ACTIVITY LIMITATIONS: carrying, lifting, bending, standing, squatting, and bed mobility  PARTICIPATION LIMITATIONS: meal prep,  cleaning, laundry, driving, shopping, and community activity  PERSONAL FACTORS: Age, Time since onset of injury/illness/exacerbation, and 1-2 comorbidities: Hx of lumbar fusion, hx cervical fusion, hx L TKA are also affecting patient's functional outcome.   REHAB POTENTIAL: Good  CLINICAL DECISION MAKING: Stable/uncomplicated  EVALUATION COMPLEXITY: Moderate   GOALS: Goals reviewed with patient? Yes  SHORT TERM GOALS: Target date: 10/17/2024   Patient will demo at least 50% compliance with HEP Baseline: Goal met 10/04/24 Goal status: INITIAL   LONG TERM GOALS: Target date: 11/14/2024    Patient will report <2/10 pain at worst to improve pain with functional activities at home. Baseline: 4-5/10 (Eval); 2/10 (10/04/24) Goal status: progressing  2.  Patient will demo good body mechanics that are painfree when picking things up from the floor. Baseline:  Goal status: goal met  3.  Pt will be able to pick up 20 lb kettle ball from floor with good body mechanics without pain. Baseline:  Goal status: not met  4.  Pt will be able to perform floor to wait transfer with 20lbs with good body mechanis to improve ability to pick up grocery bags. Baseline:  Goal status: nto met  5.  Patient will demo 10% improvement on ODI to improve overall function Baseline: 12/45 = 27%- moderate disability Goal status: INITIAL  NEW LONG TERM GOALS: Target date:12/29/2024      Patient will report <3/10 pain at worst to improve pain in morning to improve function. Baseline: 5/10 in morning and end of the day (11/17/24) Goal status: progressing    2.   Patient will demo 10% improvement on ODI to improve overall function Baseline: 12/45 = 27%- moderate disability Goal status: INITIAL  PLAN:  PT FREQUENCY: 1x/week  PT DURATION: 6 weeks  PLANNED INTERVENTIONS: 97164- PT Re-evaluation, 97750- Physical Performance Testing, 97110-Therapeutic exercises, 97530- Therapeutic activity, 97112-  Neuromuscular re-education, 97535- Self Care, 02859- Manual therapy, [phone]- Gait training, [phone]- Aquatic Therapy, [phone]- Electrical stimulation (manual), Patient/Family education, Joint mobilization, Spinal mobilization, Scar mobilization, DME instructions, Cryotherapy, and Moist heat.  PLAN FOR NEXT SESSION: Review and progress HEP   Raj LOISE Blanch, PT 11/17/2024, 2:24 PM  "

## 2024-11-24 ENCOUNTER — Ambulatory Visit

## 2024-12-01 ENCOUNTER — Ambulatory Visit

## 2024-12-08 ENCOUNTER — Ambulatory Visit

## 2024-12-13 ENCOUNTER — Ambulatory Visit

## 2025-02-02 ENCOUNTER — Other Ambulatory Visit

## 2025-02-16 ENCOUNTER — Encounter: Admitting: Internal Medicine
